# Patient Record
Sex: Female | Born: 1940 | Race: White | Hispanic: No | Marital: Married | Smoking: Never smoker
Health system: Southern US, Community
[De-identification: ages and names within clinical notes are randomized; demographics above are authoritative.]

## PROBLEM LIST (undated history)

## (undated) DIAGNOSIS — G4730 Sleep apnea, unspecified: Secondary | ICD-10-CM

## (undated) DIAGNOSIS — C801 Malignant (primary) neoplasm, unspecified: Secondary | ICD-10-CM

## (undated) DIAGNOSIS — I4891 Unspecified atrial fibrillation: Secondary | ICD-10-CM

## (undated) DIAGNOSIS — Z9289 Personal history of other medical treatment: Secondary | ICD-10-CM

## (undated) DIAGNOSIS — N3281 Overactive bladder: Secondary | ICD-10-CM

## (undated) DIAGNOSIS — Z9889 Other specified postprocedural states: Secondary | ICD-10-CM

## (undated) DIAGNOSIS — E039 Hypothyroidism, unspecified: Secondary | ICD-10-CM

## (undated) DIAGNOSIS — F32A Depression, unspecified: Secondary | ICD-10-CM

## (undated) DIAGNOSIS — I499 Cardiac arrhythmia, unspecified: Secondary | ICD-10-CM

## (undated) DIAGNOSIS — K624 Stenosis of anus and rectum: Secondary | ICD-10-CM

## (undated) DIAGNOSIS — F329 Major depressive disorder, single episode, unspecified: Secondary | ICD-10-CM

## (undated) DIAGNOSIS — I10 Essential (primary) hypertension: Secondary | ICD-10-CM

## (undated) DIAGNOSIS — M1990 Unspecified osteoarthritis, unspecified site: Secondary | ICD-10-CM

## (undated) DIAGNOSIS — E785 Hyperlipidemia, unspecified: Secondary | ICD-10-CM

## (undated) HISTORY — PX: JOINT REPLACEMENT: SHX530

## (undated) HISTORY — DX: Unspecified atrial fibrillation: I48.91

## (undated) HISTORY — PX: ABDOMINAL HYSTERECTOMY: SHX81

## (undated) HISTORY — DX: Major depressive disorder, single episode, unspecified: F32.9

## (undated) HISTORY — DX: Depression, unspecified: F32.A

## (undated) HISTORY — PX: BREAST SURGERY: SHX581

## (undated) HISTORY — PX: ANAL DILATION: SHX1137

## (undated) HISTORY — DX: Overactive bladder: N32.81

---

## 1984-07-30 HISTORY — PX: COLON SURGERY: SHX602

## 1998-02-07 ENCOUNTER — Other Ambulatory Visit: Admission: RE | Admit: 1998-02-07 | Discharge: 1998-02-07 | Payer: Self-pay | Admitting: *Deleted

## 1998-02-16 ENCOUNTER — Encounter: Admission: RE | Admit: 1998-02-16 | Discharge: 1998-05-17 | Payer: Self-pay | Admitting: Radiation Oncology

## 1998-02-17 ENCOUNTER — Ambulatory Visit (HOSPITAL_COMMUNITY): Admission: RE | Admit: 1998-02-17 | Discharge: 1998-02-17 | Payer: Self-pay | Admitting: *Deleted

## 1998-08-24 ENCOUNTER — Other Ambulatory Visit: Admission: RE | Admit: 1998-08-24 | Discharge: 1998-08-24 | Payer: Self-pay | Admitting: Radiology

## 2003-06-01 ENCOUNTER — Ambulatory Visit (HOSPITAL_COMMUNITY): Admission: RE | Admit: 2003-06-01 | Discharge: 2003-06-01 | Payer: Self-pay | Admitting: General Surgery

## 2003-06-30 ENCOUNTER — Ambulatory Visit (HOSPITAL_COMMUNITY): Admission: RE | Admit: 2003-06-30 | Discharge: 2003-06-30 | Payer: Self-pay | Admitting: Interventional Cardiology

## 2007-05-07 ENCOUNTER — Encounter (INDEPENDENT_AMBULATORY_CARE_PROVIDER_SITE_OTHER): Payer: Self-pay | Admitting: General Surgery

## 2007-05-07 ENCOUNTER — Ambulatory Visit (HOSPITAL_COMMUNITY): Admission: RE | Admit: 2007-05-07 | Discharge: 2007-05-07 | Payer: Self-pay | Admitting: General Surgery

## 2008-05-27 ENCOUNTER — Encounter: Admission: RE | Admit: 2008-05-27 | Discharge: 2008-05-27 | Payer: Self-pay | Admitting: Orthopedic Surgery

## 2009-10-14 ENCOUNTER — Inpatient Hospital Stay (HOSPITAL_COMMUNITY): Admission: RE | Admit: 2009-10-14 | Discharge: 2009-10-17 | Payer: Self-pay | Admitting: Orthopedic Surgery

## 2010-05-08 ENCOUNTER — Ambulatory Visit: Payer: Self-pay | Admitting: Oncology

## 2010-05-10 ENCOUNTER — Encounter: Admission: RE | Admit: 2010-05-10 | Discharge: 2010-05-10 | Payer: Self-pay | Admitting: Radiology

## 2010-06-12 ENCOUNTER — Encounter (INDEPENDENT_AMBULATORY_CARE_PROVIDER_SITE_OTHER): Payer: Self-pay | Admitting: Plastic Surgery

## 2010-06-12 ENCOUNTER — Inpatient Hospital Stay (HOSPITAL_COMMUNITY): Admission: RE | Admit: 2010-06-12 | Discharge: 2010-06-14 | Payer: Self-pay | Admitting: General Surgery

## 2010-06-27 ENCOUNTER — Ambulatory Visit: Payer: Self-pay | Admitting: Oncology

## 2010-07-06 ENCOUNTER — Encounter
Admission: RE | Admit: 2010-07-06 | Discharge: 2010-07-19 | Payer: Self-pay | Source: Home / Self Care | Attending: Oncology | Admitting: Oncology

## 2010-08-30 ENCOUNTER — Encounter (HOSPITAL_BASED_OUTPATIENT_CLINIC_OR_DEPARTMENT_OTHER)
Admission: RE | Admit: 2010-08-30 | Discharge: 2010-08-30 | Disposition: A | Discharge status: Home / Self Care | Payer: Medicare Other | Source: Ambulatory Visit | Attending: Plastic Surgery | Admitting: Plastic Surgery

## 2010-08-30 DIAGNOSIS — Z01812 Encounter for preprocedural laboratory examination: Secondary | ICD-10-CM | POA: Insufficient documentation

## 2010-08-30 LAB — POCT I-STAT, CHEM 8
BUN: 17 mg/dL (ref 6–23)
Calcium, Ion: 1.18 mmol/L (ref 1.12–1.32)
Chloride: 105 mEq/L (ref 96–112)
Creatinine, Ser: 0.7 mg/dL (ref 0.4–1.2)
Glucose, Bld: 100 mg/dL — ABNORMAL HIGH (ref 70–99)
HCT: 38 % (ref 36.0–46.0)
Hemoglobin: 12.9 g/dL (ref 12.0–15.0)
Potassium: 4.4 mEq/L (ref 3.5–5.1)
Sodium: 141 mEq/L (ref 135–145)
TCO2: 29 mmol/L (ref 0–100)

## 2010-08-31 ENCOUNTER — Other Ambulatory Visit: Payer: Self-pay | Admitting: Plastic Surgery

## 2010-08-31 ENCOUNTER — Ambulatory Visit (HOSPITAL_BASED_OUTPATIENT_CLINIC_OR_DEPARTMENT_OTHER): Admission: RE | Admit: 2010-08-31 | Payer: Medicare Other | Source: Ambulatory Visit | Admitting: Plastic Surgery

## 2010-08-31 ENCOUNTER — Ambulatory Visit (HOSPITAL_BASED_OUTPATIENT_CLINIC_OR_DEPARTMENT_OTHER)
Admission: RE | Admit: 2010-08-31 | Discharge: 2010-08-31 | Disposition: A | Discharge status: Home / Self Care | Payer: Medicare Other | Source: Ambulatory Visit | Attending: Plastic Surgery | Admitting: Plastic Surgery

## 2010-08-31 DIAGNOSIS — T85698A Other mechanical complication of other specified internal prosthetic devices, implants and grafts, initial encounter: Secondary | ICD-10-CM | POA: Insufficient documentation

## 2010-08-31 DIAGNOSIS — Z01812 Encounter for preprocedural laboratory examination: Secondary | ICD-10-CM | POA: Insufficient documentation

## 2010-08-31 DIAGNOSIS — Z9010 Acquired absence of unspecified breast and nipple: Secondary | ICD-10-CM | POA: Insufficient documentation

## 2010-08-31 DIAGNOSIS — C50919 Malignant neoplasm of unspecified site of unspecified female breast: Secondary | ICD-10-CM | POA: Insufficient documentation

## 2010-08-31 DIAGNOSIS — Y834 Other reconstructive surgery as the cause of abnormal reaction of the patient, or of later complication, without mention of misadventure at the time of the procedure: Secondary | ICD-10-CM | POA: Insufficient documentation

## 2010-08-31 DIAGNOSIS — G4733 Obstructive sleep apnea (adult) (pediatric): Secondary | ICD-10-CM | POA: Insufficient documentation

## 2010-08-31 DIAGNOSIS — E669 Obesity, unspecified: Secondary | ICD-10-CM | POA: Insufficient documentation

## 2010-08-31 DIAGNOSIS — I10 Essential (primary) hypertension: Secondary | ICD-10-CM | POA: Insufficient documentation

## 2010-09-12 NOTE — Discharge Summary (Signed)
[name], [name]             ACCOUNT NO.:  [id number]  MEDICAL RECORD NO.:  [id number]           PATIENT TYPE:  LOCATION:                                 FACILITY:  PHYSICIAN:  Dyke Brackett, M.D.    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  PROCEDURE PERFORMED:  Right total knee arthroplasty.  HISTORY OF ILLNESS:  The patient is a 70 year old woman, who has a many year history of right knee pain that has failed conservative treatment, including anti-inflammatories, rest and physical therapy.  Pain is preventing activities of daily living, easy sleep and safe ambulation and after discussing risks versus benefits, she wishes to proceed with a right total knee arthroplasty.  PHYSICAL EXAMINATION:  VITAL SIGNS:  Preoperative history and physical showed her vital signs to be stable.  Weight 190, height 5 feet 7. HEENT/NECK:  Atraumatic.  Pupils equal, round and reactive to light and accommodation.  TMs are clear. Neck is patent.  Throat clear.  Full range of motion of the neck, no bruits. CHEST/LUNGS:  Clear to auscultation. CARDIAC:  Regular rate and rhythm without murmur. ABDOMEN:  Soft, nontender.  No masses.  Bowel sounds 2+. NEUROVASCULARLY:  She is intact to all extremities. SKIN:  Within normal limits. MUSCULOSKELETAL:  Shows that her right lower extremity is neurovascularly intact with no obvious ligamentous laxity, slight valgus deformity, pain with range of motion.  PAST MEDICAL HISTORY: 1. Hypertension. 2. Dyslipidemia. 3. History of constipation.  ADMISSION MEDICATIONS: 1. Lipitor 80 mg. 2. Lotrel 05/20 mg daily. 3. Paroxetine 40 mg daily. 4. Hydrochlorothiazide 25 mg daily. 5. Tramadol/acetaminophen 37.5/325 daily. 6. Vesicare 10 mg at bedtime. 7. Enteric-coated aspirin  81 mg daily. 8. Tums as needed. 9. Centrum Silver daily. 10.Stool softener at bedtime.  LABORATORY DATA:  Preoperative labs including CBC, CMET, chest x-ray, EKG, PT and PTT were all within acceptable limits.  On the day of the patient's admission, she was taken to the operating room at [facility], where she underwent a right total knee replacement using an [employer] knee, size 4 tibia, size 4 femur, 10-mm bearing and a 35 mm patella.  The patient was placed on perioperative antibiotics.  She was placed on postoperative DVT prophylaxis utilizing Lovenox, as well as mechanical foot pumps and TED hose.  She was placed on pain control with both oral and p.r.n. injectable medication.  Physical therapy was begun the day of surgery with CPM, as well as physical therapy and OT.  HOSPITAL COURSE:  On postoperative day #1, the patient was complaining of mild pain.  She was alert and oriented x3.  The wound was clean and dry.  Calf was soft, nontender.  She was neurovascularly intact to light touch and motor.  Lungs were clear.  She was otherwise medically stable. She did have a decreased hemoglobin of 9.8, but showed no symptoms of dizziness or weakness.  The patient was continued to be monitored for that problem.  She continued with physical therapy utilizing CPM, as well as formal physical therapy.  Postoperative day #2, the patient continued to make slow, but steady progress in physical therapy.  The pain remained at a minimum of 1-2/10.  She was afebrile.  Vital signs were stable.  Hemoglobin 9.8 and WBC 9.8 as well.  Wound was clean and dry.  No sign of infection.  Good range of motion about the knee, medically stable.  Lungs clear.  She continued to require significant assist in physical therapy and was held another day for continued treatment.  Postoperative day #3, the patient was doing well.  She had a positive bowel movement and vital signs were stable.  Her  hemoglobin remained steady at 9.9 with a hematocrit of 28.4, WBC of 10.2.  She was making steady progress in physical therapy and was felt to be transferring with only minimal assistance and safe to go home to the care of her family and was thus discharged to the care of her family on the third postoperative day.  She will be going home with [facility] providing home health PT, CPM walker and 3-in-1 commode seat. She will return to see Dr. Madelon Lips at the 2-week postoperative mark and should call [phone] for appointment.  She is to return sooner should she have any difficulties with increased temperature greater than 101, drainage from the wound or pain not well-controlled by pain medication.  DISCHARGE MEDICATIONS: 1. Percocet 5/325 one to two tablets by mouth q.4-6 hours as needed     for pain. 2. Robaxin 500 mg 1 tablet by mouth every 6-8 h., as needed for spasm. 3. Lovenox 40 mg subcutaneous injection every day at 8 a.m. daily. 4. Lipitor 80 mg 1 p.o. daily. 5. Lotrel 5/20 daily. 6. Paroxetine 40 mg daily. 7. Hydrochlorothiazide 25 mg p.o. daily. 8. Vesicare 10 mg 1 p.o. daily. 9. Tums 1000 mg daily as needed. 10.Centrum Silver daily. 11.Stool softener at bedtime.  DRESSING CHANGES:  The patient will change the dressing on a daily basis or should the dressing become soiled or wet.  ACTIVITIES:  Weightbearing as tolerated with walker, CPM 6-8 hours a day set from 0-90.  FINAL DIAGNOSIS:  End-stage arthritis right knee.  PROCEDURE PERFORMED:  Right total knee arthroplasty.     [name]. [name]   ______________________________ Dyke Brackett, M.D.    JBR/MEDQ  D:  08/09/2010  T:  08/09/2010  Job:  [id number]  Electronically Signed by Dan Humphreys P.A. on 08/29/2010 11:00:21 AM Electronically Signed by Lacretia Nicks. Saed Hudlow M.D. on 09/12/2010 09:35:31 AM

## 2010-09-18 NOTE — Op Note (Signed)
  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  PROCEDURE:  Irrigation debridement of right breast skin and primary closure.  ANESTHESIA:  General.  INDICATIONS FOR PROCEDURE:  The patient is a 70 year old female who was diagnosed with breast cancer and underwent a mastectomy with immediate reconstruction using an expander and AlloDerm.  The AlloDerm was exposed and there was some concern leaving it for any length of time. Therefore, we may need a decision to bring it to the OR.  Risks and complications were explained.  Consent was confirmed.  DESCRIPTION OF PROCEDURE:  The patient was seen in the preop holding and taken to the OR.  She was placed on the operating room table in supine position.  General anesthesia was administered.  Once adequate time-out was called, she was prepped and draped in the usual sterile fashion. All information was confirmed to be correct and 15 blade was used to make an elliptical incision around the open area and including the soft tissue and skin that was exposed.  Hemostasis was achieved using electrocautery.  There was very good incorporation of the AlloDerm medially and superiorly.  There was only a small portion inferior lateral that was not yet incorporated.  There was no sign of infection. No pus.  It was irrigated with antibiotic solution.  The deep layer was closed with a 4-0 Vicryl interrupted and running 4-0 Vicryl and a running 5-0 Monocryl to close the skin.  Dermabond was applied prior to closure.  A 7-French drain  was placed in the area to help prevent any fluid-filled up that would inhibit incorporation of the AlloDerm that was tacked in place with a 3-0 silk.  She tolerated the procedure well. No complications.  She was awoken and taken to recovery room in stable condition.  The skin was sent to Pathology, since it was a part of the breast skin.     Wayland Denis, DO     CS/MEDQ  D:  08/31/2010  T:  09/01/2010  Job:  [id number]  Electronically Signed by Wayland Denis  on 09/18/2010 10:17:12 AM

## 2010-10-10 LAB — URINALYSIS, ROUTINE W REFLEX MICROSCOPIC
Bilirubin Urine: NEGATIVE
Glucose, UA: NEGATIVE mg/dL
Hgb urine dipstick: NEGATIVE
Ketones, ur: NEGATIVE mg/dL
Nitrite: NEGATIVE
Protein, ur: NEGATIVE mg/dL
Specific Gravity, Urine: 1.018 (ref 1.005–1.030)
Urobilinogen, UA: 0.2 mg/dL (ref 0.0–1.0)
pH: 5.5 (ref 5.0–8.0)

## 2010-10-10 LAB — BASIC METABOLIC PANEL
BUN: 10 mg/dL (ref 6–23)
BUN: 8 mg/dL (ref 6–23)
CO2: 26 mEq/L (ref 19–32)
CO2: 29 mEq/L (ref 19–32)
Calcium: 7.9 mg/dL — ABNORMAL LOW (ref 8.4–10.5)
Calcium: 8.2 mg/dL — ABNORMAL LOW (ref 8.4–10.5)
Chloride: 100 mEq/L (ref 96–112)
Chloride: 103 mEq/L (ref 96–112)
Creatinine, Ser: 0.76 mg/dL (ref 0.4–1.2)
Creatinine, Ser: 0.8 mg/dL (ref 0.4–1.2)
GFR calc Af Amer: >60 mL/min (ref >60)
GFR calc Af Amer: >60 mL/min (ref >60)
GFR calc non Af Amer: >60 mL/min (ref >60)
GFR calc non Af Amer: >60 mL/min (ref >60)
Glucose, Bld: 139 mg/dL — ABNORMAL HIGH (ref 70–99)
Glucose, Bld: 156 mg/dL — ABNORMAL HIGH (ref 70–99)
Potassium: 3.6 mEq/L (ref 3.5–5.1)
Potassium: 3.7 mEq/L (ref 3.5–5.1)
Sodium: 135 mEq/L (ref 135–145)
Sodium: 137 mEq/L (ref 135–145)

## 2010-10-10 LAB — CBC
HCT: 32.1 % — ABNORMAL LOW (ref 36.0–46.0)
HCT: 36.1 % (ref 36.0–46.0)
HCT: 40.9 % (ref 36.0–46.0)
Hemoglobin: 10.7 g/dL — ABNORMAL LOW (ref 12.0–15.0)
Hemoglobin: 11.7 g/dL — ABNORMAL LOW (ref 12.0–15.0)
Hemoglobin: 13.9 g/dL (ref 12.0–15.0)
MCH: 28.9 pg (ref 26.0–34.0)
MCH: 29.9 pg (ref 26.0–34.0)
MCH: 30.2 pg (ref 26.0–34.0)
MCHC: 32.4 g/dL (ref 30.0–36.0)
MCHC: 33.3 g/dL (ref 30.0–36.0)
MCHC: 34 g/dL (ref 30.0–36.0)
MCV: 88.7 fL (ref 78.0–100.0)
MCV: 89.1 fL (ref 78.0–100.0)
MCV: 89.7 fL (ref 78.0–100.0)
Platelets: 226 10*3/uL (ref 150–400)
Platelets: 233 10*3/uL (ref 150–400)
Platelets: 287 10*3/uL (ref 150–400)
RBC: 3.58 MIL/uL — ABNORMAL LOW (ref 3.87–5.11)
RBC: 4.05 MIL/uL (ref 3.87–5.11)
RBC: 4.61 MIL/uL (ref 3.87–5.11)
RDW: 14 % (ref 11.5–15.5)
RDW: 14.1 % (ref 11.5–15.5)
RDW: 14.3 % (ref 11.5–15.5)
WBC: 13.8 10*3/uL — ABNORMAL HIGH (ref 4.0–10.5)
WBC: 8.8 10*3/uL (ref 4.0–10.5)
WBC: 9.7 10*3/uL (ref 4.0–10.5)

## 2010-10-10 LAB — DIFFERENTIAL
Basophils Absolute: 0.1 10*3/uL (ref 0.0–0.1)
Basophils Relative: 1 % (ref 0–1)
Eosinophils Absolute: 0.3 10*3/uL (ref 0.0–0.7)
Eosinophils Relative: 3 % (ref 0–5)
Lymphocytes Relative: 33 % (ref 12–46)
Lymphs Abs: 3.2 10*3/uL (ref 0.7–4.0)
Monocytes Absolute: 1 10*3/uL (ref 0.1–1.0)
Monocytes Relative: 11 % (ref 3–12)
Neutro Abs: 5.1 10*3/uL (ref 1.7–7.7)
Neutrophils Relative %: 52 % (ref 43–77)

## 2010-10-10 LAB — SURGICAL PCR SCREEN
MRSA, PCR: NEGATIVE
Staphylococcus aureus: NEGATIVE

## 2010-10-10 LAB — URINE MICROSCOPIC-ADD ON

## 2010-10-10 LAB — COMPREHENSIVE METABOLIC PANEL
ALT: 24 U/L (ref 0–35)
AST: 30 U/L (ref 0–37)
Albumin: 4.1 g/dL (ref 3.5–5.2)
Alkaline Phosphatase: 96 U/L (ref 39–117)
BUN: 12 mg/dL (ref 6–23)
CO2: 30 mEq/L (ref 19–32)
Calcium: 9.4 mg/dL (ref 8.4–10.5)
Chloride: 101 mEq/L (ref 96–112)
Creatinine, Ser: 0.71 mg/dL (ref 0.4–1.2)
GFR calc Af Amer: >60 mL/min (ref >60)
GFR calc non Af Amer: >60 mL/min (ref >60)
Glucose, Bld: 93 mg/dL (ref 70–99)
Potassium: 3.6 mEq/L (ref 3.5–5.1)
Sodium: 138 mEq/L (ref 135–145)
Total Bilirubin: 0.4 mg/dL (ref 0.3–1.2)
Total Protein: 6.7 g/dL (ref 6.0–8.3)

## 2010-10-23 LAB — BASIC METABOLIC PANEL
BUN: 5 mg/dL — ABNORMAL LOW (ref 6–23)
BUN: 6 mg/dL (ref 6–23)
CO2: 31 mEq/L (ref 19–32)
Calcium: 7.9 mg/dL — ABNORMAL LOW (ref 8.4–10.5)
Chloride: 97 mEq/L (ref 96–112)
Chloride: 97 mEq/L (ref 96–112)
Creatinine, Ser: 0.58 mg/dL (ref 0.4–1.2)
GFR calc Af Amer: >60 mL/min (ref >60)
GFR calc non Af Amer: >60 mL/min (ref >60)
GFR calc non Af Amer: >60 mL/min (ref >60)
Glucose, Bld: 115 mg/dL — ABNORMAL HIGH (ref 70–99)
Potassium: 2.7 mEq/L — CL (ref 3.5–5.1)
Potassium: 3.1 mEq/L — ABNORMAL LOW (ref 3.5–5.1)
Potassium: 3.2 mEq/L — ABNORMAL LOW (ref 3.5–5.1)
Sodium: 138 mEq/L (ref 135–145)

## 2010-10-23 LAB — COMPREHENSIVE METABOLIC PANEL
ALT: 31 U/L (ref 0–35)
AST: 31 U/L (ref 0–37)
Albumin: 4.1 g/dL (ref 3.5–5.2)
Alkaline Phosphatase: 93 U/L (ref 39–117)
BUN: 12 mg/dL (ref 6–23)
CO2: 30 mEq/L (ref 19–32)
Calcium: 9.4 mg/dL (ref 8.4–10.5)
Chloride: 102 mEq/L (ref 96–112)
Creatinine, Ser: 0.67 mg/dL (ref 0.4–1.2)
GFR calc Af Amer: >60 mL/min (ref >60)
GFR calc non Af Amer: >60 mL/min (ref >60)
Glucose, Bld: 104 mg/dL — ABNORMAL HIGH (ref 70–99)
Potassium: 4.2 mEq/L (ref 3.5–5.1)
Sodium: 139 mEq/L (ref 135–145)
Total Bilirubin: 0.5 mg/dL (ref 0.3–1.2)
Total Protein: 6.7 g/dL (ref 6.0–8.3)

## 2010-10-23 LAB — CBC
HCT: 28.1 % — ABNORMAL LOW (ref 36.0–46.0)
HCT: 28.4 % — ABNORMAL LOW (ref 36.0–46.0)
HCT: 28.5 % — ABNORMAL LOW (ref 36.0–46.0)
HCT: 40.2 % (ref 36.0–46.0)
Hemoglobin: 13.8 g/dL (ref 12.0–15.0)
Hemoglobin: 9.8 g/dL — ABNORMAL LOW (ref 12.0–15.0)
MCHC: 34.2 g/dL (ref 30.0–36.0)
MCHC: 34.9 g/dL (ref 30.0–36.0)
MCV: 91.3 fL (ref 78.0–100.0)
MCV: 91.4 fL (ref 78.0–100.0)
MCV: 91.8 fL (ref 78.0–100.0)
Platelets: 220 10*3/uL (ref 150–400)
Platelets: 225 10*3/uL (ref 150–400)
Platelets: 291 10*3/uL (ref 150–400)
RBC: 3.09 MIL/uL — ABNORMAL LOW (ref 3.87–5.11)
RBC: 4.4 MIL/uL (ref 3.87–5.11)
RDW: 12.8 % (ref 11.5–15.5)
RDW: 13 % (ref 11.5–15.5)
WBC: 10.2 10*3/uL (ref 4.0–10.5)
WBC: 6.5 10*3/uL (ref 4.0–10.5)
WBC: 8.6 10*3/uL (ref 4.0–10.5)
WBC: 9.6 10*3/uL (ref 4.0–10.5)

## 2010-10-23 LAB — URINE CULTURE

## 2010-10-23 LAB — DIFFERENTIAL
Basophils Absolute: 0 10*3/uL (ref 0.0–0.1)
Basophils Relative: 1 % (ref 0–1)
Eosinophils Absolute: 0.1 10*3/uL (ref 0.0–0.7)
Eosinophils Relative: 1 % (ref 0–5)
Lymphocytes Relative: 30 % (ref 12–46)
Lymphs Abs: 1.9 10*3/uL (ref 0.7–4.0)
Monocytes Absolute: 0.6 10*3/uL (ref 0.1–1.0)
Monocytes Relative: 9 % (ref 3–12)
Neutro Abs: 3.9 10*3/uL (ref 1.7–7.7)
Neutrophils Relative %: 59 % (ref 43–77)

## 2010-10-23 LAB — URINALYSIS, ROUTINE W REFLEX MICROSCOPIC
Bilirubin Urine: NEGATIVE
Glucose, UA: NEGATIVE mg/dL
Hgb urine dipstick: NEGATIVE
Ketones, ur: NEGATIVE mg/dL
Nitrite: NEGATIVE
Protein, ur: NEGATIVE mg/dL
Specific Gravity, Urine: 1.007 (ref 1.005–1.030)
Urobilinogen, UA: 0.2 mg/dL (ref 0.0–1.0)
pH: 7 (ref 5.0–8.0)

## 2010-10-23 LAB — URINE MICROSCOPIC-ADD ON

## 2010-10-23 LAB — ABO/RH: ABO/RH(D): O POS

## 2010-10-23 LAB — PROTIME-INR
INR: 0.94 (ref 0.00–1.49)
Prothrombin Time: 12.5 seconds (ref 11.6–15.2)

## 2010-10-23 LAB — APTT: aPTT: 28 seconds (ref 24–37)

## 2010-10-23 LAB — TYPE AND SCREEN: Antibody Screen: NEGATIVE

## 2010-12-12 NOTE — Op Note (Signed)
[name], [name]             ACCOUNT NO.:  [id number]   MEDICAL RECORD NO.:  [id number]          PATIENT TYPE:  AMB   LOCATION:  DAY                          FACILITY:  [facility]   PHYSICIAN:  Timothy E. Earlene Plater, M.D. DATE OF BIRTH:  1940-07-31   DATE OF PROCEDURE:  DATE OF DISCHARGE:                               OPERATIVE REPORT   PREOPERATIVE DIAGNOSES:  1. Anal stenosis.  2. Anal polyps.   PROCEDURE:  1,  Exam under anesthesia.  1. Anal stretch.  2. Excision of polyp.   SURGEON:  Timothy E. Earlene Plater, M.D.   ANESTHESIA:  General.   [name] is 56, well, with controlled hypertension, who has had lower  bowel problems for some years with difficulty voiding.  She had been  treated in the past for fissure and anal stenosis.  Her condition has  worsened again as long-term follow-up in the office has demonstrated and  because of the presence of polyps and stenosis, this surgery is advised  and she agrees.  She is seen, identified and the permit is signed this  morning.   The patient is taken to the operating room and placed supine, LMA  anesthesia provided.  She was placed in lithotomy position.  Perianal  area inspected, prepped and draped in the usual fashion.  Gentle  dilatation of the anus was accomplished with the lubricated finger.  Marcaine 0.25% with epinephrine was injected around and about the anal  orifice and massaged in well.  An anal polyp was present posteriorly on  the anoderm and a larger rectal polyp was present in the right lateral  position at the junction of the papillae.  Both of these were excised.  The base was sutured with a 3-0 chromic.  Further inspection revealed no  other pathology.  The anus remained very snug on the examining finger,  so I did a partial sphincterotomy in the left posterior position,  internal sphincter only.  This completed the procedure.  There was no  other pathology.  Gelfoam and a dry sterile dressing were applied.  Counts  correct.  She tolerated it well.   Written and verbal instructions given, including Vicodin for pain, and  she will be seen and followed as an outpatient.      Timothy E. Earlene Plater, M.D.  Electronically Signed     TED/MEDQ  D:  05/07/2007  T:  05/07/2007  Job:  [id number]   cc:   Hal T. Stoneking, M.D.  Fax: [phone]

## 2010-12-15 NOTE — Cardiovascular Report (Signed)
NAME:  [name], [name]                       ACCOUNT NO.:  [id number]   MEDICAL RECORD NO.:  [id number]                   PATIENT TYPE:  OIB   LOCATION:  2894                                 FACILITY:  [facility]   PHYSICIAN:  Lesleigh Noe, M.D.            DATE OF BIRTH:  [birth year]   DATE OF PROCEDURE:  06/30/2003  DATE OF DISCHARGE:  06/30/2003                              CARDIAC CATHETERIZATION   INDICATIONS FOR PROCEDURE:  Abnormal Cardiolite stress test.   DATE OF PROCEDURE:  June 30, 2003.   PROCEDURE PERFORMED:  1. Left heart catheterization.  2. Selective coronary angiography.  3. Left ventriculography.  4. AngioSeal arteriotomy closure.   DESCRIPTION:  After informed consent, a 6-French sheath was placed in the  right femoral artery using modified Seldinger technique.  A 6-French A2  multipurpose catheter was used for hemodynamic recordings, left  ventriculography by hand injection and flush opacification of the right and  left coronaries.   Because we were unable to selectively engage either coronary artery with  multipurpose, we changed to #4 6-French left Judkins with a #4 6-French  right Judkins catheter.  Left ventriculography was performed with a 6-French  A2 multipurpose catheter by hand injection.   After completion of the diagnostic procedure, sheathogram was performed on  the right femoral to demonstrate an adequate entry site to allow  percutaneous arteriotomy closure.  We used Angio-Seal without complication  and with good hemostasis.  The patient was taken to the holding area in  stable condition.   RESULTS:   I. HEMODYNAMIC DATA:  A.  Aortic pressure 145/72.  B.  Left ventricular pressure 145/14.   II. LEFT VENTRICULOGRAPHY:  Total cavity size and systolic function are  normal.  EF 70%.   III. CORONARY ANGIOGRAPHY:  A.  Left main coronary:  Widely patent.  B.  Left anterior descending coronary:  The LAD is a large vessel that gives  origin  to a large branching diagonal.  Minimal luminal irregularities noted.  No significant obstructive disease is noted.  LAD reaches the left  ventricular apex.  C.  Circumflex artery:  The circumflex artery gives origin to a large branch  first obtuse marginal, also a second obtuse marginal branch.  The vessel  contains minimal luminal irregularities, but is essentially normal  otherwise.  D.  Right coronary:  The right coronary artery is large and free of any  significant obstruction.  PDA and left ventricular branch are normal.   IV. PERCUTANEOUS ARTERIOTOMY CLOSURE USING ANGIO-SEAL:  Successful.   CONCLUSIONS:  1. Essentially normal coronaries.  2. Normal left ventricular function.  3. Successful AngioSeal arteriotomy closure.                                               Lyn Records  III, M.D.    HWS/MEDQ  D:  06/30/2003  T:  06/30/2003  Job:  [id number]   cc:   Hal T. Stoneking, M.D.  [address] [place], [place] [address]  Fax: [phone]

## 2010-12-15 NOTE — Op Note (Signed)
   NAME:  [name], [name]                       ACCOUNT NO.:  [id number]   MEDICAL RECORD NO.:  [id number]                   PATIENT TYPE:  AMB   LOCATION:  DAY                                  FACILITY:  [facility]   PHYSICIAN:  Timothy E. Earlene Plater, M.D.              DATE OF BIRTH:  22-Oct-1940   DATE OF PROCEDURE:  06/01/2003  DATE OF DISCHARGE:                                 OPERATIVE REPORT   PREOPERATIVE DIAGNOSIS:  Anal fissure.   POSTOPERATIVE DIAGNOSES:  1. Anal fissure.  2. Anal stenosis.  3. Hemorrhoids.   PROCEDURE:  Examination under anesthesia, repair of anal fissure.   SURGEON:  Timothy E. Earlene Plater, M.D.   ANESTHESIA:  General anesthesia.   INDICATIONS FOR PROCEDURE:  [name] has been seen and followed in the  office for chronic anal fissure.  She has failed conservative management,  corrected her bowel habits and she wishes to proceed with surgical repair.  This has been carefully and completely discussed with her.  She is  identified and the permit signed.  Laboratory data reviewed.   DESCRIPTION OF PROCEDURE:  She is taken to the operating room, placed  supine.  LMA anesthesia provided.  She was gently placed in the lithotomy  position.  The perianal area was inspected, prepped and draped in the usual  fashion.  Marcaine 0.5% with epinephrine mixed 9:1 with Wydase was injected  around and about the anal orifice and massaged in well.  Digital and  anoscopic examination was performed revealing internal hemorrhoids with some  external varicosities, anal stenosis of a moderate degree and a right  posterior anal fissure with sessile tag.  A left posterior internal  sphincterotomy carefully accomplished percutaneously with a 15 blade.  This  allowed for gentle dilation.  Anoscope was introduced.  The fissure was  identified and cauterized.  The external tag attached there was excised,  undermined, closed with a subcuticular 4-0 catgut suture.  All areas were  inspected  and no complications.  The external sphincter was fully intact.  Gelfoam gauze and dry sterile dressing applied.  She tolerated it well and  was removed to the recovery room in good condition.   Written and verbal instructions including Percocet 5 mg as needed for pain  were given.  She will be seen and followed in the office.                                               Timothy E. Earlene Plater, M.D.    TED/MEDQ  D:  06/01/2003  T:  06/01/2003  Job:  [id number]   cc:   Hal T. Stoneking, M.D.  [address] [place], [place] [address]  Fax: [phone]

## 2010-12-15 NOTE — Cardiovascular Report (Signed)
NAME:  [name], [name]                       ACCOUNT NO.:  [id number]   MEDICAL RECORD NO.:  [id number]                   PATIENT TYPE:  OIB   LOCATION:  2894                                 FACILITY:  [facility]   PHYSICIAN:  Lesleigh Noe, M.D.            DATE OF BIRTH:  01-17-1941   DATE OF PROCEDURE:  06/30/2003  DATE OF DISCHARGE:  06/30/2003                              CARDIAC CATHETERIZATION   INDICATIONS:  Abnormal Cardiolite.   PROCEDURE PERFORMED:  1. Left heart catheterization.  2. Selective coronary angiography.  3. Left ventriculography.   DESCRIPTION:  After informed consent, a 6-French sheath in the right femoral  artery using a modified Seldinger technique. A 6-French A2 multipurpose  catheter was used for hemodynamic recordings, left ventriculography, and  coronary angiography. We used Angioseal for arteriotomy closure. The patient  tolerated the procedure without complications.   RESULTS:  A. Hemodynamic data.     A. Aortic pressure 140/69     B. Left ventricular pressure 142/14.  B. Left ventriculography. Left ventricular cavity size is normal. LV     function is normal.  C. Coronary angiography.     A. Left main coronary:  Normal.     B. Left anterior descending coronary:  Normal.     C. Circumflex coronary artery:  Normal.     D. Right coronary artery:  Normal.  D. Angioseal arteriotomy closure without complications.   CONCLUSION:  1. Normal coronary arteries.  2. Normal left ventricular function.   PLAN:  Home, leaving today.                                               Lesleigh Noe, M.D.   HWS/MEDQ  D:  09/21/2003  T:  09/22/2003  Job:  [id number]   cc:   Hal T. Stoneking, M.D.  [address] [place], [place] [address]  Fax: [phone]

## 2011-04-10 ENCOUNTER — Ambulatory Visit
Admission: RE | Admit: 2011-04-10 | Discharge: 2011-04-10 | Disposition: A | Discharge status: Home / Self Care | Payer: Medicare Other | Source: Ambulatory Visit | Attending: Plastic Surgery | Admitting: Plastic Surgery

## 2011-04-10 ENCOUNTER — Encounter (HOSPITAL_BASED_OUTPATIENT_CLINIC_OR_DEPARTMENT_OTHER)
Admission: RE | Admit: 2011-04-10 | Discharge: 2011-04-10 | Disposition: A | Discharge status: Home / Self Care | Payer: Medicare Other | Source: Ambulatory Visit | Attending: Plastic Surgery | Admitting: Plastic Surgery

## 2011-04-10 ENCOUNTER — Other Ambulatory Visit: Payer: Self-pay | Admitting: Plastic Surgery

## 2011-04-10 DIAGNOSIS — Z01811 Encounter for preprocedural respiratory examination: Secondary | ICD-10-CM

## 2011-04-10 LAB — BASIC METABOLIC PANEL
CO2: 32 mEq/L (ref 19–32)
Calcium: 9.6 mg/dL (ref 8.4–10.5)
Creatinine, Ser: 0.69 mg/dL (ref 0.50–1.10)
GFR calc non Af Amer: >60 mL/min (ref >60)
Glucose, Bld: 86 mg/dL (ref 70–99)
Sodium: 141 mEq/L (ref 135–145)

## 2011-04-12 ENCOUNTER — Other Ambulatory Visit: Payer: Self-pay | Admitting: Plastic Surgery

## 2011-04-12 ENCOUNTER — Ambulatory Visit (HOSPITAL_BASED_OUTPATIENT_CLINIC_OR_DEPARTMENT_OTHER)
Admission: RE | Admit: 2011-04-12 | Discharge: 2011-04-12 | Disposition: A | Discharge status: Home / Self Care | Payer: Medicare Other | Source: Ambulatory Visit | Attending: Plastic Surgery | Admitting: Plastic Surgery

## 2011-04-12 DIAGNOSIS — I10 Essential (primary) hypertension: Secondary | ICD-10-CM | POA: Insufficient documentation

## 2011-04-12 DIAGNOSIS — Z4430 Encounter for fitting and adjustment of external breast prosthesis, unspecified breast: Secondary | ICD-10-CM | POA: Insufficient documentation

## 2011-04-12 DIAGNOSIS — Z01812 Encounter for preprocedural laboratory examination: Secondary | ICD-10-CM | POA: Insufficient documentation

## 2011-04-12 DIAGNOSIS — Z01810 Encounter for preprocedural cardiovascular examination: Secondary | ICD-10-CM | POA: Insufficient documentation

## 2011-04-12 DIAGNOSIS — E669 Obesity, unspecified: Secondary | ICD-10-CM | POA: Insufficient documentation

## 2011-04-12 LAB — POCT HEMOGLOBIN-HEMACUE: Hemoglobin: 14.2 g/dL (ref 12.0–15.0)

## 2011-04-20 NOTE — Op Note (Signed)
[name], [name]             ACCOUNT NO.:  [id number]  MEDICAL RECORD NO.:  [id number]  LOCATION:                                 FACILITY:  PHYSICIAN:  Wayland Denis, DO      DATE OF BIRTH:  1940-08-26  DATE OF PROCEDURE:  04/12/2011 DATE OF DISCHARGE:                              OPERATIVE REPORT   PREOPERATIVE DIAGNOSIS:  Right breast cancer.  POSTOPERATIVE DIAGNOSIS:  Right breast cancer.  PROCEDURE:  Implant exchange, right breast with capsulectomy and excision of axillary tissue, Allergan Natrelle 650 mL saline implant used.  ATTENDING:  Wayland Denis, DO  ANESTHESIA:  General.  INDICATIONS FOR PROCEDURE:  The patient is a 70 year old white female who presents for implant exchange.  She underwent a right mastectomy with immediate reconstruction using an expander and AlloDerm.  DESCRIPTION OF PROCEDURE:  The patient was taken to the operating room after consent was confirmed.  She was placed on the operating room table in the supine position.  General anesthesia was administered.  Once adequate time-out was called, all information was confirmed to be correct.  She was prepped and draped in the usual sterile fashion.  A 15- blade was used to make an elliptical incision around the previous mastectomy scar in order to remove the preexisting scar.  Once the skin was removed, it was sent to pathology.  The Bovie was then used to dissect down in an inferior direction through the pectoralis muscle to the pocket where the expander was located.  Expander was removed by evacuating the saline.  The free capsule that was little bit loose was excised.  The pocket was irrigated with saline and antibiotic solution. The lateral margin was lateral posterior and therefore in order to bring it more medially, a capsulectomy was performed.  The capsulectomy consisted of excision of the lateral aspect of the pocket in an elliptical fashion to bring the lateral margin more anterior.   This was excised with the Bovie.  Hemostasis was achieved with electrocautery. Additionally, the inframammary fold was raised half a centimeter by excision of the capsule in that area.  A 2-0 PDS was used with a running stitch to reapproximate the capsule edges of the inframammary fold and lateral margin.  Two stitches were placed at the inferior medial aspect to better define the inframammary fold medially.  The sizer was placed and expanded to 650 and then 700, finally 680 mL was chosen for the likely size.  Therefore, the 650 mL expander from Allergan, the Natrelle, medium height was chosen.  The implant was prepared according to the manufacture's guidelines, soaked in antibiotic solution.  Air was evacuated.  The implant was placed in the pocket and expanded to 680 mL of sterile injectable normal saline.  The patient was sat up to observe symmetry.  The deep layers of the muscle were then closed with 3-0 Vicryl figure-of-eight stitches were used.  A 4-0 Vicryl was then used to close the next layer and a 5-0 Monocryl with a running subcuticular stitch was used to close the skin.  Attention was then turned to the axillary tissue, 0.25% bupivacaine with epinephrine was injected after waiting several minutes for the epinephrine  to take effect.  A 15-blade was used to make an elliptical incision to excise the excess skin that was marked preoperatively.  The Bovie was used to remove this excess tissue.  Hemostasis was achieved with electrocautery.  The deep layers were closed with 3-0 Vicryl, then a 4-0 running Vicryl, and finally a 5- 0 running subcuticular Monocryl was used to reapproximate the skin edges.  Dermabond was placed on both incisions with an ABD and a breast binder.  The patient tolerated the procedure well.  There were no complications.  The capsule that was excised was sent to pathology as well as the skin.     Tammy Raschke Sanger,  DO   ______________________________ Wayland Denis, DO    CS/MEDQ  D:  04/12/2011  T:  04/12/2011  Job:  [id number]  Electronically Signed by Wayland Denis  on 04/20/2011 07:06:36 AM

## 2011-05-10 LAB — DIFFERENTIAL
Basophils Absolute: 0
Eosinophils Relative: 3
Lymphocytes Relative: 34
Lymphs Abs: 1.9
Monocytes Absolute: 0.6
Monocytes Relative: 10
Neutro Abs: 3

## 2011-05-10 LAB — COMPREHENSIVE METABOLIC PANEL
AST: 28
Albumin: 3.7
BUN: 12
Creatinine, Ser: 0.72
GFR calc Af Amer: >60
Total Protein: 6.8

## 2011-05-10 LAB — CBC
HCT: 37.7
MCV: 87.7
Platelets: 334
RDW: 13.5
WBC: 5.7

## 2011-08-02 DIAGNOSIS — J069 Acute upper respiratory infection, unspecified: Secondary | ICD-10-CM | POA: Diagnosis not present

## 2011-08-06 DIAGNOSIS — J209 Acute bronchitis, unspecified: Secondary | ICD-10-CM | POA: Diagnosis not present

## 2011-08-21 DIAGNOSIS — S83289A Other tear of lateral meniscus, current injury, unspecified knee, initial encounter: Secondary | ICD-10-CM | POA: Diagnosis not present

## 2011-08-21 DIAGNOSIS — IMO0002 Reserved for concepts with insufficient information to code with codable children: Secondary | ICD-10-CM | POA: Diagnosis not present

## 2011-08-22 DIAGNOSIS — I10 Essential (primary) hypertension: Secondary | ICD-10-CM | POA: Diagnosis not present

## 2011-08-22 DIAGNOSIS — R05 Cough: Secondary | ICD-10-CM | POA: Diagnosis not present

## 2011-08-25 DIAGNOSIS — M1710 Unilateral primary osteoarthritis, unspecified knee: Secondary | ICD-10-CM | POA: Diagnosis not present

## 2011-08-28 DIAGNOSIS — S83289A Other tear of lateral meniscus, current injury, unspecified knee, initial encounter: Secondary | ICD-10-CM | POA: Diagnosis not present

## 2011-08-28 DIAGNOSIS — IMO0002 Reserved for concepts with insufficient information to code with codable children: Secondary | ICD-10-CM | POA: Diagnosis not present

## 2011-08-30 DIAGNOSIS — G44209 Tension-type headache, unspecified, not intractable: Secondary | ICD-10-CM | POA: Diagnosis not present

## 2011-09-12 DIAGNOSIS — E669 Obesity, unspecified: Secondary | ICD-10-CM | POA: Diagnosis not present

## 2011-09-12 DIAGNOSIS — I10 Essential (primary) hypertension: Secondary | ICD-10-CM | POA: Diagnosis not present

## 2011-10-01 DIAGNOSIS — IMO0002 Reserved for concepts with insufficient information to code with codable children: Secondary | ICD-10-CM | POA: Diagnosis not present

## 2011-10-01 DIAGNOSIS — S83289A Other tear of lateral meniscus, current injury, unspecified knee, initial encounter: Secondary | ICD-10-CM | POA: Diagnosis not present

## 2011-10-29 DIAGNOSIS — S83289A Other tear of lateral meniscus, current injury, unspecified knee, initial encounter: Secondary | ICD-10-CM | POA: Diagnosis not present

## 2011-10-29 DIAGNOSIS — IMO0002 Reserved for concepts with insufficient information to code with codable children: Secondary | ICD-10-CM | POA: Diagnosis not present

## 2011-10-30 ENCOUNTER — Emergency Department (HOSPITAL_COMMUNITY)
Admission: EM | Admit: 2011-10-30 | Discharge: 2011-10-30 | Disposition: A | Discharge status: Home / Self Care | Payer: Medicare Other | Attending: Emergency Medicine | Admitting: Emergency Medicine

## 2011-10-30 ENCOUNTER — Encounter (HOSPITAL_COMMUNITY): Payer: Self-pay | Admitting: *Deleted

## 2011-10-30 DIAGNOSIS — L298 Other pruritus: Secondary | ICD-10-CM | POA: Insufficient documentation

## 2011-10-30 DIAGNOSIS — L2989 Other pruritus: Secondary | ICD-10-CM | POA: Insufficient documentation

## 2011-10-30 DIAGNOSIS — T7840XA Allergy, unspecified, initial encounter: Secondary | ICD-10-CM

## 2011-10-30 DIAGNOSIS — R220 Localized swelling, mass and lump, head: Secondary | ICD-10-CM | POA: Insufficient documentation

## 2011-10-30 DIAGNOSIS — I10 Essential (primary) hypertension: Secondary | ICD-10-CM | POA: Insufficient documentation

## 2011-10-30 DIAGNOSIS — R1310 Dysphagia, unspecified: Secondary | ICD-10-CM | POA: Insufficient documentation

## 2011-10-30 DIAGNOSIS — R110 Nausea: Secondary | ICD-10-CM | POA: Insufficient documentation

## 2011-10-30 DIAGNOSIS — T394X5A Adverse effect of antirheumatics, not elsewhere classified, initial encounter: Secondary | ICD-10-CM | POA: Insufficient documentation

## 2011-10-30 HISTORY — DX: Essential (primary) hypertension: I10

## 2011-10-30 HISTORY — DX: Malignant (primary) neoplasm, unspecified: C80.1

## 2011-10-30 MED ORDER — FAMOTIDINE IN NACL 20-0.9 MG/50ML-% IV SOLN
20.0000 mg | Freq: Once | INTRAVENOUS | Status: AC
  Administered 2011-10-30: 20 mg via INTRAVENOUS
  Filled 2011-10-30: qty 50

## 2011-10-30 MED ORDER — METHYLPREDNISOLONE SODIUM SUCC 125 MG IJ SOLR
125.0000 mg | Freq: Once | INTRAMUSCULAR | Status: AC
  Administered 2011-10-30: 125 mg via INTRAVENOUS
  Filled 2011-10-30: qty 2

## 2011-10-30 MED ORDER — ONDANSETRON HCL 4 MG/2ML IJ SOLN
4.0000 mg | Freq: Once | INTRAMUSCULAR | Status: AC
  Administered 2011-10-30: 4 mg via INTRAVENOUS
  Filled 2011-10-30: qty 2

## 2011-10-30 MED ORDER — DIPHENHYDRAMINE HCL 50 MG/ML IJ SOLN
25.0000 mg | Freq: Once | INTRAMUSCULAR | Status: AC
  Administered 2011-10-30: 25 mg via INTRAVENOUS
  Filled 2011-10-30: qty 1

## 2011-10-30 MED ORDER — FAMOTIDINE 20 MG PO TABS: 20.0000 mg | ORAL_TABLET | Freq: Two times a day (BID) | ORAL | Status: DC

## 2011-10-30 MED ORDER — DIPHENHYDRAMINE HCL 25 MG PO TABS: 25.0000 mg | ORAL_TABLET | Freq: Four times a day (QID) | ORAL | Status: DC

## 2011-10-30 MED ORDER — PREDNISONE 50 MG PO TABS: 50.0000 mg | ORAL_TABLET | Freq: Every day | ORAL | Status: AC

## 2011-10-30 NOTE — Discharge Instructions (Signed)

## 2011-10-30 NOTE — ED Provider Notes (Signed)
History     CSN: [id number]  Arrival date & time 10/30/11  1112   First MD Initiated Contact with Patient 10/30/11 1126      Chief Complaint  Patient presents with  . Allergic Reaction    (Consider location/radiation/quality/duration/timing/severity/associated sxs/prior treatment) HPI Comments: Patient states that 3 weeks ago she was started on diclofenac and after starting the medicine she started feeling and itchiness in her throat and it was difficult to swallow.  Patient did stop the medication 3 days ago.  She did see her primary care physician Dr. Pete Glatter yesterday who requested that she stop the medicine and was going to set her up with a rheumatologist.  He did not start her on any other medications.  Patient denies shortness of breath but does have some associated nausea.  Patient also stopped all of her other home medications this week.  Patient otherwise has been well without fevers, cough, chest pain or abdominal pain.  Patient is a 71 y.o. female presenting with allergic reaction. The history is provided by the patient. No language interpreter was used.  Allergic Reaction The primary symptoms are  nausea. The primary symptoms do not include wheezing, shortness of breath, cough, abdominal pain, vomiting, diarrhea, dizziness, palpitations, altered mental status, rash, angioedema or urticaria. The current episode started more than 2 days ago. The problem has not changed since onset.This is a new problem.  Significant symptoms that are not present include eye redness.    Past Medical History  Diagnosis Date  . Hypertension   . Cancer     Past Surgical History  Procedure Date  . Breast surgery     History reviewed. No pertinent family history.  History  Substance Use Topics  . Smoking status: Never Smoker   . Smokeless tobacco: Not on file  . Alcohol Use: No    OB History    Grav Para Term Preterm Abortions TAB SAB Ect Mult Living                  Review of  Systems  Constitutional: Negative.  Negative for fever and chills.  HENT: Positive for trouble swallowing.   Eyes: Negative.  Negative for discharge and redness.  Respiratory: Negative.  Negative for cough, shortness of breath and wheezing.   Cardiovascular: Negative.  Negative for chest pain and palpitations.  Gastrointestinal: Positive for nausea. Negative for vomiting, abdominal pain and diarrhea.  Genitourinary: Negative.  Negative for dysuria and vaginal discharge.  Musculoskeletal: Negative.  Negative for back pain.  Skin: Negative.  Negative for color change and rash.  Neurological: Negative.  Negative for dizziness, syncope and headaches.  Hematological: Negative.  Negative for adenopathy.  Psychiatric/Behavioral: Negative.  Negative for confusion and altered mental status.  All other systems reviewed and are negative.    Allergies  Diclofenac  Home Medications   Current Outpatient Rx  Name Route Sig Dispense Refill  . AMLODIPINE BESYLATE 5 MG PO TABS Oral Take 5 mg by mouth daily.    . ASPIRIN EC 81 MG PO TBEC Oral Take 81 mg by mouth daily.    . ATORVASTATIN CALCIUM 40 MG PO TABS Oral Take 40 mg by mouth daily.    Marland Kitchen HYDROCHLOROTHIAZIDE 25 MG PO TABS Oral Take 25 mg by mouth daily.    . MELOXICAM 15 MG PO TABS Oral Take 15 mg by mouth daily.    . ADULT MULTIVITAMIN W/MINERALS CH Oral Take 1 tablet by mouth daily.    Marland Kitchen PAROXETINE HCL  40 MG PO TABS Oral Take 40 mg by mouth every morning.    Marland Kitchen SOLIFENACIN SUCCINATE 10 MG PO TABS Oral Take 5 mg by mouth daily.      BP 154/81  Pulse 96  Temp(Src) 98 F (36.7 C) (Oral)  Resp 20  SpO2 96%  Physical Exam  Nursing note and vitals reviewed. Constitutional: She is oriented to person, place, and time. She appears well-developed and well-nourished.  Non-toxic appearance. She does not have a sickly appearance.  HENT:  Head: Normocephalic and atraumatic.  Mouth/Throat: Oropharynx is clear and moist. No oropharyngeal exudate.         No tongue swelling.  Uvula midline.  No peritonsillar swelling.  Eyes: Conjunctivae, EOM and lids are normal. Pupils are equal, round, and reactive to light. No scleral icterus.  Neck: Trachea normal and normal range of motion. Neck supple.       No palpable swelling noted on palpation of the neck.  No lymphadenopathy.  Cardiovascular: Normal rate, regular rhythm and normal heart sounds.   Pulmonary/Chest: Effort normal and breath sounds normal. No respiratory distress. She has no wheezes. She has no rales.  Abdominal: Soft. Normal appearance. There is no tenderness. There is no rebound, no guarding and no CVA tenderness.  Musculoskeletal: Normal range of motion.  Neurological: She is alert and oriented to person, place, and time. She has normal strength.  Skin: Skin is warm, dry and intact. No rash noted.  Psychiatric: She has a normal mood and affect. Her behavior is normal. Judgment and thought content normal.    ED Course  Procedures (including critical care time)  Labs Reviewed - No data to display No results found.   No diagnosis found.    MDM  Patient with possible mild allergic reaction.  I will place her on Benadryl, Pepcid and Solu-Medrol to treat for this.  She does report some nausea so she'll receive some Zofran.  Patient is no respiratory distress and appears well and has been undergoing similar symptoms for the last 2-3 weeks.  She has already stopped the potential offending medication.  Anticipate the patient was able to be discharged with instructions to continue Benadryl, Pepcid and steroids over the next few days.        Nat Christen, MD 10/30/11 1146

## 2011-10-30 NOTE — ED Notes (Signed)
Pt reports 3-4 day hx of swelling to tongue and throat. Airway intact. Pt states that it is not getting any worse its just not getting any better. Pt able to swallow saliva. Pt reports starting new medication for knee swelling.

## 2011-11-01 ENCOUNTER — Encounter (HOSPITAL_COMMUNITY): Payer: Self-pay | Admitting: General Surgery

## 2011-11-01 ENCOUNTER — Emergency Department (HOSPITAL_COMMUNITY): Payer: Medicare Other

## 2011-11-01 ENCOUNTER — Other Ambulatory Visit: Payer: Self-pay

## 2011-11-01 ENCOUNTER — Encounter (HOSPITAL_COMMUNITY): Payer: Self-pay | Admitting: Anesthesiology

## 2011-11-01 ENCOUNTER — Encounter (HOSPITAL_COMMUNITY): Admission: EM | Disposition: A | Discharge status: Skilled Nursing Facility | Payer: Self-pay | Source: Ambulatory Visit

## 2011-11-01 ENCOUNTER — Emergency Department (HOSPITAL_COMMUNITY): Payer: Medicare Other | Admitting: Anesthesiology

## 2011-11-01 ENCOUNTER — Inpatient Hospital Stay (HOSPITAL_COMMUNITY)
Admission: EM | Admit: 2011-11-01 | Discharge: 2011-11-12 | DRG: 853 | Disposition: A | Discharge status: Skilled Nursing Facility | Payer: Medicare Other | Source: Ambulatory Visit | Attending: General Surgery | Admitting: General Surgery

## 2011-11-01 DIAGNOSIS — M726 Necrotizing fasciitis: Secondary | ICD-10-CM | POA: Diagnosis present

## 2011-11-01 DIAGNOSIS — R262 Difficulty in walking, not elsewhere classified: Secondary | ICD-10-CM | POA: Diagnosis present

## 2011-11-01 DIAGNOSIS — R000 Tachycardia, unspecified: Secondary | ICD-10-CM | POA: Diagnosis present

## 2011-11-01 DIAGNOSIS — R269 Unspecified abnormalities of gait and mobility: Secondary | ICD-10-CM | POA: Diagnosis not present

## 2011-11-01 DIAGNOSIS — K612 Anorectal abscess: Secondary | ICD-10-CM | POA: Diagnosis not present

## 2011-11-01 DIAGNOSIS — N72 Inflammatory disease of cervix uteri: Secondary | ICD-10-CM | POA: Diagnosis present

## 2011-11-01 DIAGNOSIS — L0291 Cutaneous abscess, unspecified: Secondary | ICD-10-CM | POA: Diagnosis not present

## 2011-11-01 DIAGNOSIS — IMO0002 Reserved for concepts with insufficient information to code with codable children: Secondary | ICD-10-CM | POA: Diagnosis present

## 2011-11-01 DIAGNOSIS — N760 Acute vaginitis: Secondary | ICD-10-CM | POA: Diagnosis present

## 2011-11-01 DIAGNOSIS — N7689 Other specified inflammation of vagina and vulva: Secondary | ICD-10-CM | POA: Diagnosis present

## 2011-11-01 DIAGNOSIS — E876 Hypokalemia: Secondary | ICD-10-CM | POA: Diagnosis not present

## 2011-11-01 DIAGNOSIS — D72829 Elevated white blood cell count, unspecified: Secondary | ICD-10-CM | POA: Diagnosis not present

## 2011-11-01 DIAGNOSIS — I499 Cardiac arrhythmia, unspecified: Secondary | ICD-10-CM | POA: Diagnosis not present

## 2011-11-01 DIAGNOSIS — M1710 Unilateral primary osteoarthritis, unspecified knee: Secondary | ICD-10-CM | POA: Diagnosis present

## 2011-11-01 DIAGNOSIS — R188 Other ascites: Secondary | ICD-10-CM | POA: Diagnosis not present

## 2011-11-01 DIAGNOSIS — I4891 Unspecified atrial fibrillation: Secondary | ICD-10-CM | POA: Diagnosis not present

## 2011-11-01 DIAGNOSIS — Z9010 Acquired absence of unspecified breast and nipple: Secondary | ICD-10-CM

## 2011-11-01 DIAGNOSIS — M169 Osteoarthritis of hip, unspecified: Secondary | ICD-10-CM | POA: Diagnosis not present

## 2011-11-01 DIAGNOSIS — M6281 Muscle weakness (generalized): Secondary | ICD-10-CM | POA: Diagnosis not present

## 2011-11-01 DIAGNOSIS — M799 Soft tissue disorder, unspecified: Secondary | ICD-10-CM | POA: Diagnosis not present

## 2011-11-01 DIAGNOSIS — E039 Hypothyroidism, unspecified: Secondary | ICD-10-CM | POA: Diagnosis present

## 2011-11-01 DIAGNOSIS — Z7982 Long term (current) use of aspirin: Secondary | ICD-10-CM

## 2011-11-01 DIAGNOSIS — Z853 Personal history of malignant neoplasm of breast: Secondary | ICD-10-CM

## 2011-11-01 DIAGNOSIS — N739 Female pelvic inflammatory disease, unspecified: Secondary | ICD-10-CM | POA: Diagnosis not present

## 2011-11-01 DIAGNOSIS — I10 Essential (primary) hypertension: Secondary | ICD-10-CM | POA: Diagnosis present

## 2011-11-01 DIAGNOSIS — N764 Abscess of vulva: Secondary | ICD-10-CM | POA: Diagnosis not present

## 2011-11-01 DIAGNOSIS — N289 Disorder of kidney and ureter, unspecified: Secondary | ICD-10-CM | POA: Diagnosis not present

## 2011-11-01 DIAGNOSIS — L02219 Cutaneous abscess of trunk, unspecified: Secondary | ICD-10-CM | POA: Diagnosis present

## 2011-11-01 DIAGNOSIS — L0390 Cellulitis, unspecified: Secondary | ICD-10-CM | POA: Diagnosis not present

## 2011-11-01 DIAGNOSIS — I482 Chronic atrial fibrillation, unspecified: Secondary | ICD-10-CM | POA: Diagnosis present

## 2011-11-01 DIAGNOSIS — I5030 Unspecified diastolic (congestive) heart failure: Secondary | ICD-10-CM | POA: Diagnosis not present

## 2011-11-01 DIAGNOSIS — Z5189 Encounter for other specified aftercare: Secondary | ICD-10-CM | POA: Diagnosis not present

## 2011-11-01 DIAGNOSIS — Z01810 Encounter for preprocedural cardiovascular examination: Secondary | ICD-10-CM | POA: Diagnosis not present

## 2011-11-01 DIAGNOSIS — E785 Hyperlipidemia, unspecified: Secondary | ICD-10-CM | POA: Insufficient documentation

## 2011-11-01 DIAGNOSIS — C50919 Malignant neoplasm of unspecified site of unspecified female breast: Secondary | ICD-10-CM | POA: Insufficient documentation

## 2011-11-01 DIAGNOSIS — I5031 Acute diastolic (congestive) heart failure: Secondary | ICD-10-CM | POA: Diagnosis not present

## 2011-11-01 DIAGNOSIS — B999 Unspecified infectious disease: Secondary | ICD-10-CM | POA: Diagnosis not present

## 2011-11-01 DIAGNOSIS — A419 Sepsis, unspecified organism: Secondary | ICD-10-CM | POA: Diagnosis not present

## 2011-11-01 DIAGNOSIS — L03319 Cellulitis of trunk, unspecified: Secondary | ICD-10-CM | POA: Diagnosis present

## 2011-11-01 DIAGNOSIS — A499 Bacterial infection, unspecified: Secondary | ICD-10-CM | POA: Diagnosis not present

## 2011-11-01 DIAGNOSIS — N9089 Other specified noninflammatory disorders of vulva and perineum: Secondary | ICD-10-CM | POA: Diagnosis not present

## 2011-11-01 DIAGNOSIS — R0602 Shortness of breath: Secondary | ICD-10-CM | POA: Diagnosis not present

## 2011-11-01 DIAGNOSIS — A4159 Other Gram-negative sepsis: Secondary | ICD-10-CM | POA: Diagnosis not present

## 2011-11-01 HISTORY — PX: INCISION AND DRAINAGE PERIRECTAL ABSCESS: SHX1804

## 2011-11-01 HISTORY — DX: Cardiac arrhythmia, unspecified: I49.9

## 2011-11-01 HISTORY — DX: Stenosis of anus and rectum: K62.4

## 2011-11-01 HISTORY — DX: Sleep apnea, unspecified: G47.30

## 2011-11-01 HISTORY — DX: Hyperlipidemia, unspecified: E78.5

## 2011-11-01 HISTORY — DX: Unspecified osteoarthritis, unspecified site: M19.90

## 2011-11-01 LAB — BASIC METABOLIC PANEL
BUN: 13 mg/dL (ref 6–23)
BUN: 12 mg/dL (ref 6–23)
CO2: 29 mEq/L (ref 19–32)
CO2: 27 mEq/L (ref 19–32)
CO2: 29 mEq/L (ref 19–32)
Calcium: 9.1 mg/dL (ref 8.4–10.5)
Calcium: 8 mg/dL — ABNORMAL LOW (ref 8.4–10.5)
Chloride: 87 mEq/L — ABNORMAL LOW (ref 96–112)
Chloride: 95 mEq/L — ABNORMAL LOW (ref 96–112)
Creatinine, Ser: 0.66 mg/dL (ref 0.50–1.10)
Creatinine, Ser: 0.64 mg/dL (ref 0.50–1.10)
Glucose, Bld: 143 mg/dL — ABNORMAL HIGH (ref 70–99)
Glucose, Bld: 175 mg/dL — ABNORMAL HIGH (ref 70–99)
Sodium: 131 mEq/L — ABNORMAL LOW (ref 135–145)

## 2011-11-01 LAB — CBC
HCT: 38.2 % (ref 36.0–46.0)
HCT: 32.9 % — ABNORMAL LOW (ref 36.0–46.0)
Hemoglobin: 13.4 g/dL (ref 12.0–15.0)
MCV: 86.8 fL (ref 78.0–100.0)
RDW: 13.3 % (ref 11.5–15.5)
WBC: 19.3 10*3/uL — ABNORMAL HIGH (ref 4.0–10.5)
WBC: 21.9 10*3/uL — ABNORMAL HIGH (ref 4.0–10.5)

## 2011-11-01 LAB — DIFFERENTIAL
Basophils Absolute: 0 10*3/uL (ref 0.0–0.1)
Basophils Absolute: 0 10*3/uL (ref 0.0–0.1)
Eosinophils Relative: 0 % (ref 0–5)
Lymphocytes Relative: 4 % — ABNORMAL LOW (ref 12–46)
Lymphocytes Relative: 7 % — ABNORMAL LOW (ref 12–46)
Monocytes Absolute: 0.7 10*3/uL (ref 0.1–1.0)
Monocytes Absolute: 0.9 10*3/uL (ref 0.1–1.0)
Monocytes Relative: 4 % (ref 3–12)
Monocytes Relative: 4 % (ref 3–12)
Neutro Abs: 17.1 10*3/uL — ABNORMAL HIGH (ref 1.7–7.7)

## 2011-11-01 LAB — POCT I-STAT 7, (LYTES, BLD GAS, ICA,H+H)
Acid-Base Excess: 5 mmol/L — ABNORMAL HIGH (ref 0.0–2.0)
Calcium, Ion: 1.06 mmol/L — ABNORMAL LOW (ref 1.12–1.32)
O2 Saturation: 99 %
Potassium: 2.9 mEq/L — ABNORMAL LOW (ref 3.5–5.1)

## 2011-11-01 LAB — PROCALCITONIN: Procalcitonin: 0.43 ng/mL

## 2011-11-01 LAB — GRAM STAIN

## 2011-11-01 LAB — MRSA PCR SCREENING: MRSA by PCR: NEGATIVE

## 2011-11-01 SURGERY — INCISION AND DRAINAGE, ABSCESS, PERIRECTAL
Anesthesia: General | Site: Perineum | Wound class: Dirty or Infected

## 2011-11-01 MED ORDER — PIPERACILLIN-TAZOBACTAM 3.375 G IVPB
3.3750 g | Freq: Three times a day (TID) | INTRAVENOUS | Status: DC
  Administered 2011-11-01 – 2011-11-05 (×11): 3.375 g via INTRAVENOUS
  Filled 2011-11-01 (×14): qty 50

## 2011-11-01 MED ORDER — HYDROGEN PEROXIDE 3 % EX SOLN
CUTANEOUS | Status: DC | PRN
  Administered 2011-11-01: 1

## 2011-11-01 MED ORDER — PANTOPRAZOLE SODIUM 40 MG IV SOLR: 40.0000 mg | Freq: Every day | INTRAVENOUS | Status: DC

## 2011-11-01 MED ORDER — PIPERACILLIN-TAZOBACTAM 3.375 G IVPB
INTRAVENOUS | Status: AC
  Administered 2011-11-01: 3.375 g via INTRAVENOUS
  Filled 2011-11-01: qty 50

## 2011-11-01 MED ORDER — METOPROLOL TARTRATE 1 MG/ML IV SOLN
5.0000 mg | Freq: Four times a day (QID) | INTRAVENOUS | Status: DC | PRN
  Filled 2011-11-01: qty 5

## 2011-11-01 MED ORDER — SODIUM CHLORIDE 0.9 % IV BOLUS (SEPSIS)
1000.0000 mL | Freq: Once | INTRAVENOUS | Status: AC
  Administered 2011-11-01: 1000 mL via INTRAVENOUS

## 2011-11-01 MED ORDER — GENTAMICIN SULFATE 40 MG/ML IJ SOLN: 1.5000 mg/kg | Freq: Once | INTRAMUSCULAR | Status: DC

## 2011-11-01 MED ORDER — FENTANYL CITRATE 0.05 MG/ML IJ SOLN
INTRAMUSCULAR | Status: DC | PRN
  Administered 2011-11-01: 100 ug via INTRAVENOUS
  Administered 2011-11-01: 150 ug via INTRAVENOUS

## 2011-11-01 MED ORDER — SUCCINYLCHOLINE CHLORIDE 20 MG/ML IJ SOLN
INTRAMUSCULAR | Status: DC | PRN
  Administered 2011-11-01: 120 mg via INTRAVENOUS

## 2011-11-01 MED ORDER — VANCOMYCIN HCL IN DEXTROSE 1-5 GM/200ML-% IV SOLN
1000.0000 mg | Freq: Once | INTRAVENOUS | Status: AC
  Administered 2011-11-01: 1000 mg via INTRAVENOUS
  Filled 2011-11-01: qty 200

## 2011-11-01 MED ORDER — LACTATED RINGERS IV SOLN
INTRAVENOUS | Status: DC | PRN
  Administered 2011-11-01: 16:00:00 via INTRAVENOUS

## 2011-11-01 MED ORDER — ONDANSETRON HCL 4 MG/2ML IJ SOLN
INTRAMUSCULAR | Status: DC | PRN
  Administered 2011-11-01: 4 mg via INTRAVENOUS

## 2011-11-01 MED ORDER — KCL IN DEXTROSE-NACL 20-5-0.45 MEQ/L-%-% IV SOLN
INTRAVENOUS | Status: DC
  Administered 2011-11-02 – 2011-11-03 (×2): via INTRAVENOUS
  Filled 2011-11-01 (×8): qty 1000

## 2011-11-01 MED ORDER — POTASSIUM CHLORIDE 10 MEQ/100ML IV SOLN
INTRAVENOUS | Status: DC | PRN
  Administered 2011-11-01 (×2): 10 meq via INTRAVENOUS

## 2011-11-01 MED ORDER — LACTATED RINGERS IV SOLN
INTRAVENOUS | Status: DC | PRN
  Administered 2011-11-01 (×2): via INTRAVENOUS

## 2011-11-01 MED ORDER — ETOMIDATE 2 MG/ML IV SOLN
INTRAVENOUS | Status: DC | PRN
  Administered 2011-11-01: 20 mg via INTRAVENOUS

## 2011-11-01 MED ORDER — DIPHENHYDRAMINE HCL 12.5 MG/5ML PO ELIX
12.5000 mg | ORAL_SOLUTION | Freq: Four times a day (QID) | ORAL | Status: DC | PRN
  Filled 2011-11-01: qty 5

## 2011-11-01 MED ORDER — DIPHENHYDRAMINE HCL 50 MG/ML IJ SOLN: 12.5000 mg | Freq: Four times a day (QID) | INTRAMUSCULAR | Status: DC | PRN

## 2011-11-01 MED ORDER — ONDANSETRON HCL 4 MG/2ML IJ SOLN
4.0000 mg | Freq: Once | INTRAMUSCULAR | Status: AC
  Administered 2011-11-01: 4 mg via INTRAVENOUS
  Filled 2011-11-01: qty 2

## 2011-11-01 MED ORDER — MORPHINE SULFATE 2 MG/ML IJ SOLN
1.0000 mg | INTRAMUSCULAR | Status: DC | PRN
  Administered 2011-11-03 – 2011-11-04 (×6): 2 mg via INTRAVENOUS
  Filled 2011-11-01 (×7): qty 1

## 2011-11-01 MED ORDER — MORPHINE SULFATE 2 MG/ML IJ SOLN: 1.0000 mg | INTRAMUSCULAR | Status: DC | PRN

## 2011-11-01 MED ORDER — DEXTROSE 5 % IV SOLN
1.0000 g | Freq: Once | INTRAVENOUS | Status: AC
  Administered 2011-11-01: 1 g via INTRAVENOUS
  Filled 2011-11-01: qty 10

## 2011-11-01 MED ORDER — HYDROMORPHONE HCL PF 1 MG/ML IJ SOLN
0.2500 mg | INTRAMUSCULAR | Status: DC | PRN
Start: 2011-11-01 — End: 2011-11-01

## 2011-11-01 MED ORDER — ONDANSETRON HCL 4 MG/2ML IJ SOLN: 4.0000 mg | Freq: Four times a day (QID) | INTRAMUSCULAR | Status: DC | PRN

## 2011-11-01 MED ORDER — POTASSIUM CHLORIDE 10 MEQ/100ML IV SOLN: 10.0000 meq | INTRAVENOUS | Status: DC

## 2011-11-01 MED ORDER — VANCOMYCIN HCL 1000 MG IV SOLR
1250.0000 mg | Freq: Two times a day (BID) | INTRAVENOUS | Status: DC
  Administered 2011-11-02 – 2011-11-04 (×6): 1250 mg via INTRAVENOUS
  Filled 2011-11-01 (×9): qty 1250

## 2011-11-01 MED ORDER — MORPHINE SULFATE 2 MG/ML IJ SOLN: 0.0500 mg/kg | INTRAMUSCULAR | Status: DC | PRN

## 2011-11-01 MED ORDER — 0.9 % SODIUM CHLORIDE (POUR BTL) OPTIME
TOPICAL | Status: DC | PRN
  Administered 2011-11-01: 1000 mL

## 2011-11-01 MED ORDER — POTASSIUM CHLORIDE 10 MEQ/100ML IV SOLN
10.0000 meq | Freq: Once | INTRAVENOUS | Status: AC
  Administered 2011-11-01: 10 meq via INTRAVENOUS
  Filled 2011-11-01: qty 100

## 2011-11-01 MED ORDER — IOHEXOL 300 MG/ML  SOLN
100.0000 mL | Freq: Once | INTRAMUSCULAR | Status: AC | PRN
  Administered 2011-11-01: 100 mL via INTRAVENOUS

## 2011-11-01 MED ORDER — HYDROMORPHONE HCL PF 1 MG/ML IJ SOLN
1.0000 mg | Freq: Once | INTRAMUSCULAR | Status: AC
  Administered 2011-11-01: 1 mg via INTRAVENOUS
  Filled 2011-11-01: qty 1

## 2011-11-01 MED ORDER — ENOXAPARIN SODIUM 40 MG/0.4ML ~~LOC~~ SOLN
40.0000 mg | SUBCUTANEOUS | Status: DC
  Administered 2011-11-02 – 2011-11-08 (×7): 40 mg via SUBCUTANEOUS
  Filled 2011-11-01 (×10): qty 0.4

## 2011-11-01 MED ORDER — KCL IN DEXTROSE-NACL 20-5-0.45 MEQ/L-%-% IV SOLN
INTRAVENOUS | Status: DC
  Filled 2011-11-01 (×3): qty 1000

## 2011-11-01 MED ORDER — ONDANSETRON HCL 4 MG PO TABS: 4.0000 mg | ORAL_TABLET | Freq: Four times a day (QID) | ORAL | Status: DC | PRN

## 2011-11-01 SURGICAL SUPPLY — 40 items
BANDAGE GAUZE ELAST BULKY 4 IN (GAUZE/BANDAGES/DRESSINGS) ×2 IMPLANT
BLADE SURG 15 STRL LF DISP TIS (BLADE) ×1 IMPLANT
BLADE SURG 15 STRL SS (BLADE) ×1
CANISTER SUCTION 2500CC (MISCELLANEOUS) ×2 IMPLANT
CLEANER TIP ELECTROSURG 2X2 (MISCELLANEOUS) ×2 IMPLANT
CLOTH BEACON ORANGE TIMEOUT ST (SAFETY) ×2 IMPLANT
COVER SURGICAL LIGHT HANDLE (MISCELLANEOUS) ×2 IMPLANT
DRAPE LAPAROSCOPIC ABDOMINAL (DRAPES) ×2 IMPLANT
DRAPE PROXIMA HALF (DRAPES) ×2 IMPLANT
DRAPE UTILITY 15X26 W/TAPE STR (DRAPE) ×4 IMPLANT
DRSG PAD ABDOMINAL 8X10 ST (GAUZE/BANDAGES/DRESSINGS) ×4 IMPLANT
ELECT REM PT RETURN 9FT ADLT (ELECTROSURGICAL) ×2
ELECTRODE REM PT RTRN 9FT ADLT (ELECTROSURGICAL) ×1 IMPLANT
GAUZE PACKING IODOFORM 1 (PACKING) IMPLANT
GAUZE SPONGE 4X4 16PLY XRAY LF (GAUZE/BANDAGES/DRESSINGS) ×2 IMPLANT
GLOVE BIO SURGEON STRL SZ7.5 (GLOVE) ×2 IMPLANT
GLOVE SURG SIGNA 7.5 PF LTX (GLOVE) ×2 IMPLANT
GOWN STRL NON-REIN LRG LVL3 (GOWN DISPOSABLE) ×4 IMPLANT
GOWN STRL REIN XL XLG (GOWN DISPOSABLE) ×2 IMPLANT
HYDROGEN PEROXIDE 16OZ (MISCELLANEOUS) ×2 IMPLANT
KIT BASIN OR (CUSTOM PROCEDURE TRAY) ×2 IMPLANT
KIT ROOM TURNOVER OR (KITS) ×2 IMPLANT
NS IRRIG 1000ML POUR BTL (IV SOLUTION) ×2 IMPLANT
PACK LITHOTOMY IV (CUSTOM PROCEDURE TRAY) ×2 IMPLANT
PAD ARMBOARD 7.5X6 YLW CONV (MISCELLANEOUS) ×4 IMPLANT
PENCIL BUTTON HOLSTER BLD 10FT (ELECTRODE) ×2 IMPLANT
SPONGE GAUZE 4X4 12PLY (GAUZE/BANDAGES/DRESSINGS) ×2 IMPLANT
SPONGE LAP 18X18 X RAY DECT (DISPOSABLE) ×6 IMPLANT
SWAB COLLECTION DEVICE MRSA (MISCELLANEOUS) ×2 IMPLANT
SYR BULB 3OZ (MISCELLANEOUS) ×2 IMPLANT
SYR BULB IRRIGATION 50ML (SYRINGE) ×2 IMPLANT
TAPE CLOTH SURG 4X10 WHT LF (GAUZE/BANDAGES/DRESSINGS) ×2 IMPLANT
TOWEL OR 17X24 6PK STRL BLUE (TOWEL DISPOSABLE) ×2 IMPLANT
TOWEL OR 17X26 10 PK STRL BLUE (TOWEL DISPOSABLE) ×2 IMPLANT
TRAY FOLEY CATH 14FRSI W/METER (CATHETERS) ×2 IMPLANT
TUBE ANAEROBIC SPECIMEN COL (MISCELLANEOUS) ×2 IMPLANT
TUBE CONNECTING 12X1/4 (SUCTIONS) ×2 IMPLANT
UNDERPAD 30X30 INCONTINENT (UNDERPADS AND DIAPERS) ×2 IMPLANT
WATER STERILE IRR 1000ML POUR (IV SOLUTION) ×2 IMPLANT
YANKAUER SUCT BULB TIP NO VENT (SUCTIONS) ×2 IMPLANT

## 2011-11-01 NOTE — ED Notes (Signed)
LAB AT BEDSIDE. WILL START ATB WHEN THEY ARE FINISHED DRAWING CULTURES

## 2011-11-01 NOTE — Anesthesia Preprocedure Evaluation (Addendum)
Anesthesia Evaluation  Patient identified by MRN, date of birth, ID band Patient awake    Reviewed: Allergy & Precautions, H&P , NPO status , Patient's Chart, lab work & pertinent test results  Airway Mallampati: II      Dental  (+) Teeth Intact and Dental Advisory Given   Pulmonary neg pulmonary ROS,  breath sounds clear to auscultation        Cardiovascular hypertension, Pt. on medications + dysrhythmias Atrial Fibrillation Rhythm:Irregular Rate:Normal  New onset atrial fib as per patient and family awaiting cardiology consult with Dr. Mayford Knife.   Neuro/Psych    GI/Hepatic negative GI ROS, Neg liver ROS,   Endo/Other  negative endocrine ROS  Renal/GU negative Renal ROS     Musculoskeletal   Abdominal   Peds  Hematology negative hematology ROS (+)   Anesthesia Other Findings   Reproductive/Obstetrics                         Anesthesia Physical Anesthesia Plan  ASA: IV  Anesthesia Plan: General   Post-op Pain Management:    Induction: Intravenous  Airway Management Planned: Oral ETT  Additional Equipment: Arterial line  Intra-op Plan:   Post-operative Plan: Possible Post-op intubation/ventilation  Informed Consent: I have reviewed the patients History and Physical, chart, labs and discussed the procedure including the risks, benefits and alternatives for the proposed anesthesia with the patient or authorized representative who has indicated his/her understanding and acceptance.   Dental advisory given  Plan Discussed with: CRNA  Anesthesia Plan Comments:        Anesthesia Quick Evaluation

## 2011-11-01 NOTE — ED Notes (Signed)
2301-01 Ready 

## 2011-11-01 NOTE — Anesthesia Postprocedure Evaluation (Signed)
  Anesthesia Post-op Note  Patient: [name]  Procedure(s) Performed: Procedure(s) (LRB): IRRIGATION AND DEBRIDEMENT PERIRECTAL ABSCESS (N/A)  Patient Location: PACU  Anesthesia Type: General  Level of Consciousness: awake, alert  and oriented  Airway and Oxygen Therapy: Patient Spontanous Breathing and Patient connected to nasal cannula oxygen  Post-op Pain: mild  Post-op Assessment: Post-op Vital signs reviewed, Patient's Cardiovascular Status Stable, Respiratory Function Stable, Patent Airway, No signs of Nausea or vomiting and Pain level controlled  Post-op Vital Signs: Reviewed and stable  Complications: No apparent anesthesia complications

## 2011-11-01 NOTE — H&P (Signed)
[name] 07/17/1941  [id number].   Primary Care MD: Dr. Merlene Laughter Requesting MD: Dr. Patria Mane, ER physician  Chief Complaint/Reason for Consult: necrotizing fasciitis HPI: This is a very pleasant 71 yo female with a history of HTN and breast cancer.  About 10 days ago she was placed on Diclofenac secondary to knee arthritis.  She had an allergic reaction to this with difficult swallowing and breathing.  She came to the [facility] on Tuesday for evaluation and she was given predisone and other medications for her allergic reaction.    The husband provides much of the history as the patient is very sedated and unable to provide any significant history.  He is unsure exactly when this problem started.  She said something to him about some pain in her perineum on Tuesday, but made no other comments.  He thinks she said that she initially started having some pain 3-4 days ago.  She saw her PCP this morning and was immediately sent over to [facility] for evaluation.  She had a CT scan which revealed diffuse air in her mons pubis area, left labia tracking posterior towards her rectum and down her left thigh.  We were immediately called to see the patient for admission.  He denies any fevers or chills.  Some occasional SOB secondary to initial allergic reaction.  No chest pain.  Review of systems: Please see HPI, otherwise all other systems are negative, except decreased appetite over last several days.  History reviewed. No pertinent family history.  Past Medical History  Diagnosis Date  . Hypertension   . Hyperlipidemia   . Cancer     breast cancer  . Anal stenosis     Past Surgical History  Procedure Date  . Breast surgery     right mastectomy; left lumpectomy  . Anal dilation     multiple  . Abdominal hysterectomy   . Joint replacement     right knee  laparotomy for intussusception  Social History:  reports that she has never smoked. She does not have any smokeless tobacco history on  file. She reports that she drinks alcohol. Her drug history not on file.  Allergies:  Allergies  Allergen Reactions  . Diclofenac Itching    Feels difficult to swallow, but no tongue swelling or SOB    Medications Prior to Admission  Medication Dose Route Frequency Provider Last Rate Last Dose  . cefTRIAXone (ROCEPHIN) 1 g in dextrose 5 % 50 mL IVPB  1 g Intravenous Once Scarlette Calico C. [place], PA   1 g at 11/01/11 1135  . dextrose 5 % and 0.45 % NaCl with KCl 20 mEq/L infusion   Intravenous Continuous Letha Cape, PA      . diphenhydrAMINE (BENADRYL) injection 12.5 mg  12.5 mg Intravenous Q6H PRN Letha Cape, PA       Or  . diphenhydrAMINE (BENADRYL) 12.5 MG/5ML elixir 12.5 mg  12.5 mg Oral Q6H PRN Letha Cape, PA      . HYDROmorphone (DILAUDID) injection 1 mg  1 mg Intravenous Once Scarlette Calico C. [place], [place]   1 mg at 11/01/11 1132  . iohexol (OMNIPAQUE) 300 MG/ML solution 100 mL  100 mL Intravenous Once PRN Medication Radiologist, MD   100 mL at 11/01/11 1245  . morphine 2 MG/ML injection 1-4 mg  1-4 mg Intravenous Q2H PRN Letha Cape, PA      . ondansetron [facility]) injection 4 mg  4 mg Intravenous Once Scarlette Calico C. [place], [place]  4 mg at 11/01/11 1132  . ondansetron (ZOFRAN) injection 4 mg  4 mg Intravenous Q6H PRN Letha Cape, PA      . pantoprazole (PROTONIX) injection 40 mg  40 mg Intravenous QHS Letha Cape, PA      . piperacillin-tazobactam (ZOSYN) 3-0.375 GM/50ML IVPB        3.375 g at 11/01/11 1400  . potassium chloride 10 mEq in 100 mL IVPB  10 mEq Intravenous Once Scarlette Calico C. [place], PA   10 mEq at 11/01/11 1341  . potassium chloride 10 mEq in 100 mL IVPB  10 mEq Intravenous Once Scarlette Calico C. [place], [place]      . potassium chloride 10 mEq in 100 mL IVPB  10 mEq Intravenous Q1 Hr x 4 Letha Cape, PA      . sodium chloride 0.9 % bolus 1,000 mL  1,000 mL Intravenous Once Scarlette Calico C. [place], PA   1,000 mL at 11/01/11 1132  . vancomycin (VANCOCIN) IVPB 1000 mg/200 mL  premix  1,000 mg Intravenous Once Scarlette Calico C. [place], PA   1,000 mg at 11/01/11 1341  . DISCONTD: gentamicin (GARAMYCIN) 1.5 mg/kg in dextrose 5 % 50 mL IVPB  1.5 mg/kg Intravenous Once Lyanne Co, MD       Medications Prior to Admission  Medication Sig Dispense Refill  . amLODipine (NORVASC) 5 MG tablet Take 5 mg by mouth daily.      Marland Kitchen aspirin EC 81 MG tablet Take 81 mg by mouth daily.      Marland Kitchen atorvastatin (LIPITOR) 40 MG tablet Take 40 mg by mouth daily.      . diphenhydrAMINE (BENADRYL) 25 MG tablet Take 1 tablet (25 mg total) by mouth every 6 (six) hours.  20 tablet  0  . famotidine (PEPCID) 20 MG tablet Take 1 tablet (20 mg total) by mouth 2 (two) times daily.  14 tablet  0  . hydrochlorothiazide (HYDRODIURIL) 25 MG tablet Take 25 mg by mouth daily.      . meloxicam (MOBIC) 15 MG tablet Take 15 mg by mouth daily.      . Multiple Vitamin (MULITIVITAMIN WITH MINERALS) TABS Take 1 tablet by mouth daily.      Marland Kitchen PARoxetine (PAXIL) 40 MG tablet Take 40 mg by mouth every morning.      . predniSONE (DELTASONE) 50 MG tablet Take 1 tablet (50 mg total) by mouth daily.  5 tablet  0  . solifenacin (VESICARE) 10 MG tablet Take 10 mg by mouth daily.         Blood pressure 132/63, pulse 103, temperature 99.3 F (37.4 C), temperature source Oral, resp. rate 18, SpO2 92.00%. Physical Exam: General: pleasant, WD, WN white female who is laying in bed very sedated. HEENT: head is normocephalic, atraumatic.  Sclera are noninjected.  PERRL.  Ears and nose without any masses or lesions.  Mouth is pink and moist Heart: irregularly, irregular, tachycardic.  Normal s1,s2. No obvious murmurs, gallops, or rubs noted.  Palpable radial and pedal pulses bilaterally Lungs: CTAB, no wheezes, rhonchi, or rales noted.  Respiratory effort nonlabored Abd: soft, NT, ND, +BS, no masses, hernias, or organomegaly.  paramidline scar noted from surgery as a child GU: significant erythema and induration of mons pubis,  extending into her left labia, tracking back through her perineum and into her buttock.  She also has erythema tracking down her left thigh with some areas of early skin necrosis.  No frank crepitus or air if acutely palpable  given her significant induration. MS: all 4 extremities are symmetrical with no cyanosis, clubbing, or edema. Skin: warm and dry with no masses, lesions, or rashes Psych: A&Ox3 with an appropriate affect.    Results for orders placed during the hospital encounter of 11/01/11 (from the past 48 hour(s))  CBC     Status: Abnormal   Collection Time   11/01/11 11:21 AM      Component Value Range Comment   WBC 19.3 (*) 4.0 - 10.5 (K/uL)    RBC 4.44  3.87 - 5.11 (MIL/uL)    Hemoglobin 13.4  12.0 - 15.0 (g/dL)    HCT 21.3  08.6 - 57.8 (%)    MCV 86.0  78.0 - 100.0 (fL)    MCH 30.2  26.0 - 34.0 (pg)    MCHC 35.1  30.0 - 36.0 (g/dL)    RDW 46.9  62.9 - 52.8 (%)    Platelets 343  150 - 400 (K/uL)   DIFFERENTIAL     Status: Abnormal   Collection Time   11/01/11 11:21 AM      Component Value Range Comment   Neutrophils Relative 92 (*) 43 - 77 (%)    Neutro Abs 17.1 (*) 1.7 - 7.7 (K/uL)    Lymphocytes Relative 4 (*) 12 - 46 (%)    Lymphs Abs 0.8  0.7 - 4.0 (K/uL)    Monocytes Relative 4  3 - 12 (%)    Monocytes Absolute 0.7  0.1 - 1.0 (K/uL)    Eosinophils Relative 0  0 - 5 (%)    Eosinophils Absolute 0.0  0.0 - 0.7 (K/uL)    Basophils Relative 0  0 - 1 (%)    Basophils Absolute 0.0  0.0 - 0.1 (K/uL)   BASIC METABOLIC PANEL     Status: Abnormal   Collection Time   11/01/11 11:21 AM      Component Value Range Comment   Sodium 131 (*) 135 - 145 (mEq/L)    Potassium 2.5 (*) 3.5 - 5.1 (mEq/L)    Chloride 87 (*) 96 - 112 (mEq/L)    CO2 29  19 - 32 (mEq/L)    Glucose, Bld 143 (*) 70 - 99 (mg/dL)    BUN 16  6 - 23 (mg/dL)    Creatinine, Ser 4.13  0.50 - 1.10 (mg/dL)    Calcium 9.1  8.4 - 10.5 (mg/dL)    GFR calc non Af Amer 82 (*) >90 (mL/min)    GFR calc Af Amer >90  >90  (mL/min)   LACTIC ACID, PLASMA     Status: Normal   Collection Time   11/01/11  1:04 PM      Component Value Range Comment   Lactic Acid, Venous 1.6  0.5 - 2.2 (mmol/L)   PROCALCITONIN     Status: Normal   Collection Time   11/01/11  1:04 PM      Component Value Range Comment   Procalcitonin 0.43      Dg Chest 1 View  11/01/2011  *RADIOLOGY REPORT*  Clinical Data: Shortness of breath.  CHEST - 1 VIEW  Comparison: 04/10/2011  Findings: There is peribronchial thickening.  Low lung volumes. Heart size is accentuated by the low volumes.  No effusions or acute bony abnormality.  IMPRESSION: Low lung volumes, peribronchial thickening.  Original Report Authenticated By: Cyndie Chime, M.D.   Ct Pelvis W Contrast  11/01/2011  *RADIOLOGY REPORT*  Clinical Data:  Suspected left labial/perirectal abscess  CT PELVIS WITH  CONTRAST  Technique:  Multidetector CT imaging of the pelvis was performed using the standard protocol following the bolus administration of intravenous contrast.  Contrast:   100 ml Omnipaque-300 IV  Comparison:  None.  Findings:  No drainable fluid collection or abscess.  Extensive soft tissue gas predominately involving the left labial/peritoneal region (series 2/image 51), highly suspicious for necrotizing fasciitis/Fournier's gangrene. Gas extends into the left lower abdominopelvic wall (series 2/image 36), left medial thigh (series 2/image 61), bilateral ischiorectal fossa (series 2/image 41), and extending into the right lower pelvis (series 2/image 30) via the obturator internus.  Of note, the gas in the left medial thigh is incompletely visualized in its inferior extent.  Layering contrast in the bladder.  Status post hysterectomy.  No adnexal masses.  Colonic diverticulosis, without associate inflammatory changes.  No pelvic ascites.  No suspicious pelvic lymphadenopathy.  Degenerative changes at L4-5.  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 Emergent surgical and ID consultation is suggested.  Critical Value/emergent results were called by telephone at the time of interpretation on 11/01/2011  at 1254 hours  to  Dr. Azalia Bilis, who verbally acknowledged these results.  Original Report Authenticated By: Charline Bills, M.D.       Assessment/Plan 1. Necrotizing fasciitis of perineum 2. Sepsis 3. New onset Atrial Fibrillation 4. HTN 5. H/o breast cancer (disease free) 6.  History of rectal stricture - treated by Dr. Cindee Lame in 04/2007. 7.  Recent reaction to diclofenac.  Plan: 1. The patient will need emergent surgical intervention for her necrotizing and very severe infection.  She will be started on Vanc and Zosyn.  We have discussed all of this with the patient and the husband as the patient is very sedated.  Her EKG reveals new onset A. Fib.  Cardiology has been requested to see the patient postoperatively for this condition.  Just prior to being taken to the operating room, the patient was put on a non-rebreather as she was saturating only 88% on 4L [place].  The patient will go to an ICU postoperatively, and will likely need the assistance of CCM.  We have d/w the patient and her husband that she may require multiple surgical operations, but we would not know definitively yet.  They understood.  We will proceed to the OR at this time.  OSBORNE,KELLY E 11/01/2011, 2:06 PM  The patient has an acute presentation of necrotizing fasciitis/cellulitis of left perineum/left labia that appears to have come up over the last few days.  Her only precipitating problem is the recent reaction to diclofenac. She also has a history of rectal stricture, which required dilatation by Dr. Cindee Lame in Oct. 2008.  She controls her bowel habits with stool softeners. I  spoke with Dr. Alexis Goodell, who is her PCP.  Since admitting her, she has gone into A.Fib.  Dr. Mendel Ryder to see. She will go to the OR today.  I discussed the operation with her husband and all the potential complications.  We discussed returning to the OR for further debridements, diverting ostomy,and the life threatening nature of this process.  Ovidio Kin, MD, [facility] Pager: [phone] Office phone:  [phone]

## 2011-11-01 NOTE — ED Notes (Signed)
Patient has arrived in cdu. She is awake and alert but her gait is very unsteady. Husband states pt normally ambulates without any assist. Pt assisted to restroom to collect ua . Sat for some time and states she cannot void. Transport here to take her and noticed when pt stood she had voided in toilet but did not know she had. Pt assisted back to stretcher

## 2011-11-01 NOTE — Preoperative (Signed)
Beta Blockers   Reason not to administer Beta Blockers:Not Applicable 

## 2011-11-01 NOTE — ED Provider Notes (Signed)
Assumed patient care at [id number]. 71 year old female with what appears to be cellulitis of the left labia fold. Pelvic CT scan ordered to rule out abscess. There appears to be no evidence of perirectal involvement on initial exam by provider. Patient does have an elevated white count with left shift. Plan to have patient admitted for further management. IV antibiotic and he in the ED.  12:52 PM K+ 2.5.  Will replenish if CT shows no acute surgical related findings.    1:21 PM Pelvic CT scan is significant for necrotizing infection. CT scan was originally read by my attending who was notified center [place] [facility] for immediate consultation. Surgical PA is available at bedside and is currently evaluating the patient. She does not appears to toxic. However she will be managed surgically today.  ECG ordered for preop and for hypoK  Fayrene Helper, PA-C 11/01/11 1339

## 2011-11-01 NOTE — Anesthesia Procedure Notes (Signed)
Procedure Name: Intubation Date/Time: 11/01/2011 4:15 PM Performed by: Domnick Chervenak S Pre-anesthesia Checklist: Patient identified, Emergency Drugs available, Suction available, Patient being monitored and Timeout performed Patient Re-evaluated:Patient Re-evaluated prior to inductionOxygen Delivery Method: Circle system utilized Preoxygenation: Pre-oxygenation with 100% oxygen Intubation Type: IV induction Ventilation: Mask ventilation without difficulty Grade View: Grade I Tube type: Oral Tube size: 8.0 mm Number of attempts: 1 Airway Equipment and Method: Stylet Secured at: 22 cm Tube secured with: Tape Dental Injury: Teeth and Oropharynx as per pre-operative assessment

## 2011-11-01 NOTE — ED Notes (Signed)
Pt sent to ER by her PCP Stoneking MD for perirectal abcess soft tissue infection. Pt Hx Afib and previous rectal infection. Pt also had recent oral reaction tongue swelling to diflucan.

## 2011-11-01 NOTE — Progress Notes (Addendum)
ANTIBIOTIC CONSULT NOTE - INITIAL  Pharmacy Consult for Zosyn Indication: Necrotizing Fasciitis  Allergies  Allergen Reactions  . Diclofenac Itching    Feels difficult to swallow, but no tongue swelling or SOB    Patient Measurements:     Vital Signs: Temp: 99.3 F (37.4 C) (04/04 1409) Temp src: Oral (04/04 1409) BP: 115/59 mmHg (04/04 1409) Pulse Rate: 112  (04/04 1409) Intake/Output from previous day:   Intake/Output from this shift:    Labs:  Basename 11/01/11 1121  WBC 19.3*  HGB 13.4  PLT 343  LABCREA --  CREATININE 0.76   CrCl is unknown because there is no height on file for the current visit. No results found for this basename: VANCOTROUGH:2,VANCOPEAK:2,VANCORANDOM:2,GENTTROUGH:2,GENTPEAK:2,GENTRANDOM:2,TOBRATROUGH:2,TOBRAPEAK:2,TOBRARND:2,AMIKACINPEAK:2,AMIKACINTROU:2,AMIKACIN:2, in the last 72 hours   Microbiology: No results found for this or any previous visit (from the past 720 hour(s)).  Medical History: Past Medical History  Diagnosis Date  . Hypertension   . Hyperlipidemia   . Cancer     breast cancer  . Anal stenosis     Medications:  Anti-infectives     Start     Dose/Rate Route Frequency Ordered Stop   11/01/11 1351  piperacillin-tazobactam (ZOSYN) 3-0.375 GM/50ML IVPB    Comments: KOHUT, NIKKI: cabinet override        11/01/11 1351 11/01/11 1400   11/01/11 1315   gentamicin (GARAMYCIN) 1.5 mg/kg in dextrose 5 % 50 mL IVPB  Status:  Discontinued        1.5 mg/kg 100 mL/hr over 30 Minutes Intravenous  Once 11/01/11 1310 11/01/11 1404   11/01/11 1130   vancomycin (VANCOCIN) IVPB 1000 mg/200 mL premix        1,000 mg 200 mL/hr over 60 Minutes Intravenous  Once 11/01/11 1127     11/01/11 1130   cefTRIAXone (ROCEPHIN) 1 g in dextrose 5 % 50 mL IVPB        1 g 100 mL/hr over 30 Minutes Intravenous  Once 11/01/11 1127 11/01/11 1205         Assessment: Necrotizing Fasciitis:  To continue broad-spectrum antimicrobial therapy with  Zosyn.  First doses of Vancomycin and Zosyn were received in the Emergency Department.  Her renal function is normal.  Goal of Therapy:  Appropriate antimicrobial therapy for indication and organ function  Plan:  Zosyn 3.375gm IV q8h extended infusion Monitor renal function and microbiologic data.  Estella Husk, Pharm.D., BCPS Clinical Pharmacist  Pager [phone] 11/01/2011, 2:16 PM   Addendum: To continue Vancomycin as well post-operatively.  Weight 95kg.  Estimated CrCl ~42ml/min. Will continue with Vancomycin 1250mg  IV q12h for a goal trough of 15-20 mcg/ml.  Estella Husk, Pharm.D., BCPS Clinical Pharmacist  Pager [phone] 11/01/2011, 6:57 PM

## 2011-11-01 NOTE — Brief Op Note (Signed)
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  PROCEDURE:   Procedure(s): IRRIGATION AND DEBRIDEMENT of left perineum/left labia, rigid sigmoidoscopy.  SURGEON:   Ovidio Kin, M.D.  ASSISTANT:   none  ANESTHESIA:   general  Judie Petit, MD - Anesthesiologist  General  EBL:  200  ml  BLOOD ADMINISTERED: none  DRAINS: Wound packed open  LOCAL MEDICATIONS USED:   none  SPECIMEN:   Debrided tissue  COUNTS CORRECT:  YES  INDICATIONS FOR PROCEDURE:  [name] is a 71 y.o. (DOB: April 24, 1941) white female whose primary care physician is Ginette Otto, MD, MD.  She came to the [facility] ER with increasing left perineal/left labia pain.  The CT scan shows extensive SQ air along left perineum/left labia.  She has a necrotizing infection and comes for I&D.   The indications and risks of the surgery were explained to the patient.  The risks include, but are not limited to, infection, bleeding, and nerve injury.  Note dictated to:   #[id number]

## 2011-11-01 NOTE — ED Provider Notes (Signed)
Medical screening examination/treatment/procedure(s) were conducted as a shared visit with non-physician practitioner(s) and myself.  I personally evaluated the patient during the encounter   Please see my other note for details  Lyanne Co, MD 11/01/11 2036

## 2011-11-01 NOTE — ED Notes (Signed)
Patient transported to X-ray 

## 2011-11-01 NOTE — ED Provider Notes (Signed)
Medical screening examination/treatment/procedure(s) were conducted as a shared visit with non-physician practitioner(s) and myself.  I personally evaluated the patient during the encounter  Necrotizing infection. [facility] called for stat evaluation at bedside. Family updated. Will add gentamicin for superior gram negative coverage. Lactate added.   Lyanne Co, MD 11/01/11 [phone]

## 2011-11-01 NOTE — ED Notes (Signed)
PA AWARE OF PT 2.5 K LEVEL

## 2011-11-01 NOTE — ED Provider Notes (Signed)
History     CSN: [id number]  Arrival date & time 11/01/11  1046   First MD Initiated Contact with Patient 11/01/11 1057      Chief Complaint  Patient presents with  . peri rectal abcess     (Consider location/radiation/quality/duration/timing/severity/associated sxs/prior treatment) HPI Comments: Patient with history of rectal surgery presents after having been seen at PCP (Dr. Pete George) office for same.  States was seen here on Tuesday with tongue swelling and allergic reaction to diclofenac, did not have the rectal pain at that time - states started later that evening, reports gradual worsening of the pain, was seen by Dr. Pete George who suspects a perirectal abscess.  Patient reports pain to the labia on the left.  Denies and drainage or discharge, reports fever but no chills, denies rectal bleeding or purulent drainage.  Patient is a 71 y.o. female presenting with female genitourinary complaint. The history is provided by the patient and the spouse. No language interpreter was used.  Female GU Problem Primary symptoms include genital pain and genital rash.  Primary symptoms include no discharge, no pelvic pain, no dyspareunia, no genital lesions, no genital itching, no genital odor, no dysuria and no vaginal bleeding. The maximum temperature recorded prior to her arrival was 100 to 100.9 F. The fever has been present for 1 to 2 days. This is a new problem. The current episode started 2 days ago. The problem occurs constantly. The problem has been gradually worsening. The symptoms occur at rest. She is not pregnant. She has not missed her period. Vaginal discharge characteristics: none. Pertinent negatives include no anorexia, no diaphoresis, no abdominal swelling, no abdominal pain, no constipation, no diarrhea, no nausea, no vomiting, no frequency, no light-headedness and no dizziness. She has tried nothing for the symptoms. The treatment provided no relief. Sexual activity: non-contributory.  There is no concern regarding sexually transmitted diseases. Associated medical issues include perineal abscess.    Past Medical History  Diagnosis Date  . Hypertension   . Cancer     Past Surgical History  Procedure Date  . Breast surgery     No family history on file.  History  Substance Use Topics  . Smoking status: Never Smoker   . Smokeless tobacco: Not on file  . Alcohol Use: No    OB History    Grav Para Term Preterm Abortions TAB SAB Ect Mult Living                  Review of Systems  Constitutional: Negative for diaphoresis.  Gastrointestinal: Positive for rectal pain. Negative for nausea, vomiting, abdominal pain, diarrhea, constipation, anal bleeding and anorexia.  Genitourinary: Positive for genital sores and vaginal pain. Negative for dysuria, frequency, vaginal bleeding, vaginal discharge, pelvic pain and dyspareunia.  Neurological: Negative for dizziness and light-headedness.  All other systems reviewed and are negative.    Allergies  Diclofenac  Home Medications   Current Outpatient Rx  Name Route Sig Dispense Refill  . AMLODIPINE BESYLATE 5 MG PO TABS Oral Take 5 mg by mouth daily.    . ASPIRIN EC 81 MG PO TBEC Oral Take 81 mg by mouth daily.    . ATORVASTATIN CALCIUM 40 MG PO TABS Oral Take 40 mg by mouth daily.    Marland Kitchen CALCIUM CARBONATE ANTACID 500 MG PO CHEW Oral Chew 1 tablet by mouth daily.    Marland Kitchen DIPHENHYDRAMINE HCL 25 MG PO TABS Oral Take 1 tablet (25 mg total) by mouth every 6 (six) hours.  20 tablet 0  . FAMOTIDINE 20 MG PO TABS Oral Take 1 tablet (20 mg total) by mouth 2 (two) times daily. 14 tablet 0  . HYDROCHLOROTHIAZIDE 25 MG PO TABS Oral Take 25 mg by mouth daily.    . MELOXICAM 15 MG PO TABS Oral Take 15 mg by mouth daily.    . ADULT MULTIVITAMIN W/MINERALS CH Oral Take 1 tablet by mouth daily.    Marland Kitchen PAROXETINE HCL 40 MG PO TABS Oral Take 40 mg by mouth every morning.    Marland Kitchen PREDNISONE 50 MG PO TABS Oral Take 1 tablet (50 mg total) by  mouth daily. 5 tablet 0  . SOLIFENACIN SUCCINATE 10 MG PO TABS Oral Take 10 mg by mouth daily.       BP 132/63  Pulse 103  Temp(Src) 99.3 F (37.4 C) (Oral)  Resp 18  SpO2 92%  Physical Exam  Nursing note and vitals reviewed. Constitutional: She is oriented to person, place, and time. She appears well-developed and well-nourished. No distress.  HENT:  Head: Normocephalic and atraumatic.  Right Ear: External ear normal.  Left Ear: External ear normal.  Nose: Nose normal.  Mouth/Throat: Oropharynx is clear and moist. No oropharyngeal exudate.  Eyes: Conjunctivae are normal. Pupils are equal, round, and reactive to light. No scleral icterus.  Neck: Normal range of motion. Neck supple. No thyromegaly present.  Cardiovascular: Normal rate, regular rhythm and normal heart sounds.  Exam reveals no gallop and no friction rub.   No murmur heard. Pulmonary/Chest: Effort normal and breath sounds normal. No respiratory distress. She has no wheezes. She has no rales. She exhibits no tenderness.  Abdominal: Soft. Bowel sounds are normal. She exhibits no distension and no mass. There is tenderness. There is no rebound and no guarding.    Genitourinary:    There is no rash or tenderness on the right labia. There is rash and tenderness on the left labia. There is no lesion on the left labia. There is erythema and tenderness around the vagina. No bleeding around the vagina. No vaginal discharge found.  Musculoskeletal: Normal range of motion. She exhibits no edema and no tenderness.  Lymphadenopathy:    She has no cervical adenopathy.  Neurological: She is alert and oriented to person, place, and time. No cranial nerve deficit.  Skin: Skin is warm and dry. There is erythema. No pallor.  Psychiatric: She has a normal mood and affect. Her behavior is normal. Judgment and thought content normal.    ED Course  Procedures (including critical care time)   Labs Reviewed  CBC  DIFFERENTIAL    URINALYSIS, ROUTINE W REFLEX MICROSCOPIC   No results found.   No diagnosis found.    MDM  Patient to be transferred to CDU for CT scan and IV antibiotics will continue to be followed by Dr. Patria Mane and B. Ardelle Park        [name] [place], [place] 11/01/11 1251

## 2011-11-01 NOTE — Transfer of Care (Signed)
Immediate Anesthesia Transfer of Care Note  Patient: [name]  Procedure(s) Performed: Procedure(s) (LRB): IRRIGATION AND DEBRIDEMENT PERIRECTAL ABSCESS (N/A)  Patient Location: PACU  Anesthesia Type: General  Level of Consciousness: awake, alert  and oriented  Airway & Oxygen Therapy: Patient Spontanous Breathing and Patient connected to nasal cannula oxygen  Post-op Assessment: Report given to PACU RN and Post -op Vital signs reviewed and stable  Post vital signs: Reviewed and stable  Complications: No apparent anesthesia complications

## 2011-11-01 NOTE — Consult Note (Signed)
Admit date: 11/01/2011 Referring Physician  Dr. Dorris Singh  Primary Physician  Dr. Merlene Laughter Primary Cardiologist  Dr. Verdis Prime Reason for Consultation  Atrial fibrillation new onset  HPI: This is a very pleasant 71 yo female with a history of HTN and breast cancer. About 10 days ago she was placed on Diclofenac secondary to knee arthritis. She had an allergic reaction to this with difficult swallowing and breathing. She came to the [facility] on Tuesday for evaluation and she was given predisone and other medications for her allergic reaction. The husband provides much of the history as the patient is very sedated and unable to provide any significant history. He is unsure exactly when this problem started. She said something to him about some pain in her perineum on Tuesday, but made no other comments. He thinks she said that she initially started having some pain 3-4 days ago. She saw her PCP this morning and was immediately sent over to [facility] for evaluation. She had a CT scan which revealed diffuse air in her mons pubis area, left labia tracking posterior towards her rectum and down her left thigh. We were immediately called to see the patient for admission.  She denies any fevers or chills. Some occasional SOB secondary to initial allergic reaction but no DOE prior to the reaction. No chest pain. She denies any palpitations, dizziness, exertional fatigue or syncope.  She has never had atrial fibrillation today but in preop was noted to have new onset atrial fibrillation with fairly good rate control of unknown duration.       PMH:   Past Medical History  Diagnosis Date  . Hypertension   . Hyperlipidemia   . Cancer     breast cancer  . Anal stenosis      PSH:   Past Surgical History  Procedure Date  . Breast surgery     right mastectomy; left lumpectomy  . Anal dilation     multiple  . Abdominal hysterectomy   . Joint replacement     right knee    Allergies:  Diclofenac Prior to  Admit Meds:   (Not in a hospital admission) Fam HX:   History reviewed. No pertinent family history. Social HX:    History   Social History  . Marital Status: Married    Spouse Name: N/A    Number of Children: N/A  . Years of Education: N/A   Occupational History  . Not on file.   Social History Main Topics  . Smoking status: Never Smoker   . Smokeless tobacco: Not on file  . Alcohol Use: Yes     ocassional glass of wine  . Drug Use:   . Sexually Active:    Other Topics Concern  . Not on file   Social History Narrative  . No narrative on file     ROS:  All 11 ROS were addressed and are negative except what is stated in the HPI  Physical Exam: Blood pressure 115/59, pulse 112, temperature 99.3 F (37.4 C), temperature source Oral, resp. rate 19, SpO2 91.00%.    General: Well developed, well nourished, in no acute distress Head: Eyes PERRLA, No xanthomas.   Normal cephalic and atramatic  Lungs:   Clear bilaterally to auscultation and percussion. Heart:   Irregularly irregular S1 S2 Pulses are 2+ & equal.            No carotid bruit. No JVD.  No abdominal bruits. No femoral bruits. Abdomen: Bowel sounds  are positive, abdomen soft and non-tender without masses  Extremities:   No clubbing, cyanosis or edema.  DP +1 Neuro: Alert and oriented X 3. Somewhat confused due to pain meds     Labs:   Lab Results  Component Value Date   WBC 19.3* 11/01/2011   HGB 13.4 11/01/2011   HCT 38.2 11/01/2011   MCV 86.0 11/01/2011   PLT 343 11/01/2011    Lab 11/01/11 1121  NA 131*  K 2.5*  CL 87*  CO2 29  BUN 16  CREATININE 0.76  CALCIUM 9.1  PROT --  BILITOT --  ALKPHOS --  ALT --  AST --  GLUCOSE 143*   No results found for this basename: PTT   Lab Results  Component Value Date   INR 0.94 10/10/2009       Radiology:  Dg Chest 1 View  11/01/2011  *RADIOLOGY REPORT*  Clinical Data: Shortness of breath.  CHEST - 1 VIEW  Comparison: 04/10/2011  Findings: There is  peribronchial thickening.  Low lung volumes. Heart size is accentuated by the low volumes.  No effusions or acute bony abnormality.  IMPRESSION: Low lung volumes, peribronchial thickening.  Original Report Authenticated By: Cyndie Chime, M.D.   Ct Pelvis W Contrast  11/01/2011  *RADIOLOGY REPORT*  Clinical Data:  Suspected left labial/perirectal abscess  CT PELVIS WITH CONTRAST  Technique:  Multidetector CT imaging of the pelvis was performed using the standard protocol following the bolus administration of intravenous contrast.  Contrast:   100 ml Omnipaque-300 IV  Comparison:  None.  Findings:  No drainable fluid collection or abscess.  Extensive soft tissue gas predominately involving the left labial/peritoneal region (series 2/image 51), highly suspicious for necrotizing fasciitis/Fournier's gangrene. Gas extends into the left lower abdominopelvic wall (series 2/image 36), left medial thigh (series 2/image 61), bilateral ischiorectal fossa (series 2/image 41), and extending into the right lower pelvis (series 2/image 30) via the obturator internus.  Of note, the gas in the left medial thigh is incompletely visualized in its inferior extent.  Layering contrast in the bladder.  Status post hysterectomy.  No adnexal masses.  Colonic diverticulosis, without associate inflammatory changes.  No pelvic ascites.  No suspicious pelvic lymphadenopathy.  Degenerative changes at L4-5.  IMPRESSION: Suspected necrotizing fasciitis/Fournier's gangrene, predominantly involving the left perineal region as described above, but also involving the left medial thigh (incompletely imaged) and extending from the right ischiorectal fascia into the right lower pelvis. Emergent surgical and ID consultation is suggested.  Critical Value/emergent results were called by telephone at the time of interpretation on 11/01/2011  at 1254 hours  to  Dr. Azalia Bilis, who verbally acknowledged these results.  Original Report Authenticated By:  Charline Bills, M.D.    EKG: Atrial fibrillation with RVR with HR 115bpm, no ST changes  ASSESSMENT:  1.  New onset atrial fibrillation of unknown duration in the setting of acute severe illness - heart rate ranging from 85-105bpm 2.  Necrotizing fasciitis of the perineum 3.  Sepsis 4.  HTN 5.  H/O breast cancer 6.  History of rectal stricture 7.  Recent reaction to dicofenac  PLAN:   1.  Patient is stable from a cardiac standpoint for surgery 2.  If heart rate becomes increased during the operative/periop period can use IV Lopressor for rate control as BP tolerates 3.  Check 2D echo post-op to assess LVF 4.  Will follow along post-op 5.  Replete potassium  Quintella Reichert, MD  11/01/2011  3:42  PM

## 2011-11-02 ENCOUNTER — Encounter (HOSPITAL_COMMUNITY): Payer: Self-pay | Admitting: Surgery

## 2011-11-02 LAB — CBC
HCT: 29.2 % — ABNORMAL LOW (ref 36.0–46.0)
Hemoglobin: 10 g/dL — ABNORMAL LOW (ref 12.0–15.0)
RBC: 3.32 MIL/uL — ABNORMAL LOW (ref 3.87–5.11)
WBC: 12.3 10*3/uL — ABNORMAL HIGH (ref 4.0–10.5)

## 2011-11-02 LAB — COMPREHENSIVE METABOLIC PANEL
BUN: 12 mg/dL (ref 6–23)
Calcium: 8 mg/dL — ABNORMAL LOW (ref 8.4–10.5)
Creatinine, Ser: 0.68 mg/dL (ref 0.50–1.10)
GFR calc Af Amer: >90 mL/min (ref >90)
GFR calc non Af Amer: 85 mL/min — ABNORMAL LOW (ref >90)
Glucose, Bld: 120 mg/dL — ABNORMAL HIGH (ref 70–99)
Sodium: 132 mEq/L — ABNORMAL LOW (ref 135–145)
Total Protein: 5.5 g/dL — ABNORMAL LOW (ref 6.0–8.3)

## 2011-11-02 LAB — DIFFERENTIAL
Lymphs Abs: 1.3 10*3/uL (ref 0.7–4.0)
Monocytes Relative: 4 % (ref 3–12)
Neutro Abs: 10.4 10*3/uL — ABNORMAL HIGH (ref 1.7–7.7)
Neutrophils Relative %: 85 % — ABNORMAL HIGH (ref 43–77)

## 2011-11-02 MED ORDER — METOPROLOL TARTRATE 25 MG PO TABS
25.0000 mg | ORAL_TABLET | Freq: Two times a day (BID) | ORAL | Status: DC
  Administered 2011-11-02 – 2011-11-05 (×8): 25 mg via ORAL
  Filled 2011-11-02 (×10): qty 1

## 2011-11-02 MED ORDER — POTASSIUM CHLORIDE 20 MEQ/15ML (10%) PO LIQD
40.0000 meq | Freq: Once | ORAL | Status: AC
  Administered 2011-11-02: 40 meq
  Filled 2011-11-02: qty 30

## 2011-11-02 NOTE — Progress Notes (Signed)
General Surgery Note  LOS: 1 day  POD# 1 Room - 2301 PCP - Dr. Alexis Goodell  Assessment/Plan:  1. Necrotizing fasciitis -  IRRIGATION AND DEBRIDEMENT Left perineum/left labia - D. Aarin Sparkman - 11/01/2011  Wound look much better today.  Will not return to the OR today.  Will start dressing changes Q 8 hours.  Mentally much better today.  I spoke to the husband on the phone.  2.  Sepsis  - overall looks better.  On Vanc/Zosyn  3.  Atrial fibrillation   Seen by Dr. Kym Groom 4.  Hypertension. 5.  History of breast cancer.  Subjective:  Feels better.  Speech pattern better. Objective:   Filed Vitals:   11/02/11 0725  BP:   Pulse:   Temp: 98.9 F (37.2 C)  Resp:      Intake/Output from previous day:  04/04 0701 - 04/05 0700 In: 3575 [P.O.:150; I.V.:3100; IV Piggyback:325] Out: 1345 [Urine:1345]  Intake/Output this shift:      Physical Exam:   General: WN older WF who is alert and oriented.    HEENT: Normal. Pupils equal. .   Lungs: Clear   Abdomen: Soft.   Wound: Took the bandage down.  Wound much cleaner.  Will do local dressing changes for now.   Neurologic:  Grossly intact to motor and sensory function.   Psychiatric: Has normal mood and affect. Behavior is normal   Lab Results:    Basename 11/02/11 0359 11/01/11 1731  WBC 12.3* 21.9*  HGB 10.0* 11.2*  HCT 29.2* 32.9*  PLT 277 334    BMET   Basename 11/02/11 0359 11/01/11 1930  NA 132* 132*  K 3.2* 3.3*  CL 94* 94*  CO2 30 29  GLUCOSE 120* 175*  BUN 12 12  CREATININE 0.68 0.64  CALCIUM 8.0* 8.0*    PT/INR  No results found for this basename: LABPROT:2,INR:2 in the last 72 hours  ABG   Basename 11/01/11 1557  PHART 7.452*  HCO3 29.4*     Studies/Results:  Dg Chest 1 View  11/01/2011  *RADIOLOGY REPORT*  Clinical Data: Shortness of breath.  CHEST - 1 VIEW  Comparison: 04/10/2011  Findings: There is peribronchial thickening.  Low lung volumes. Heart size is accentuated by the low volumes.  No  effusions or acute bony abnormality.  IMPRESSION: Low lung volumes, peribronchial thickening.  Original Report Authenticated By: Cyndie Chime, M.D.   Ct Pelvis W Contrast  11/01/2011  *RADIOLOGY REPORT*  Clinical Data:  Suspected left labial/perirectal abscess  CT PELVIS WITH CONTRAST  Technique:  Multidetector CT imaging of the pelvis was performed using the standard protocol following the bolus administration of intravenous contrast.  Contrast:   100 ml Omnipaque-300 IV  Comparison:  None.  Findings:  No drainable fluid collection or abscess.  Extensive soft tissue gas predominately involving the left labial/peritoneal region (series 2/image 51), highly suspicious for necrotizing fasciitis/Fournier's gangrene. Gas extends into the left lower abdominopelvic wall (series 2/image 36), left medial thigh (series 2/image 61), bilateral ischiorectal fossa (series 2/image 41), and extending into the right lower pelvis (series 2/image 30) via the obturator internus.  Of note, the gas in the left medial thigh is incompletely visualized in its inferior extent.  Layering contrast in the bladder.  Status post hysterectomy.  No adnexal masses.  Colonic diverticulosis, without associate inflammatory changes.  No pelvic ascites.  No suspicious pelvic lymphadenopathy.  Degenerative changes at L4-5.  IMPRESSION: Suspected necrotizing fasciitis/Fournier's gangrene, predominantly involving the left perineal  region as described above, but also involving the left medial thigh (incompletely imaged) and extending from the right ischiorectal fascia into the right lower pelvis. Emergent surgical and ID consultation is suggested.  Critical Value/emergent results were called by telephone at the time of interpretation on 11/01/2011  at 1254 hours  to  Dr. Azalia Bilis, who verbally acknowledged these results.  Original Report Authenticated By: Charline Bills, M.D.     Anti-infectives:   Anti-infectives     Start     Dose/Rate  Route Frequency Ordered Stop   11/02/11 0000   vancomycin (VANCOCIN) 1,250 mg in sodium chloride 0.9 % 250 mL IVPB        1,250 mg 166.7 mL/hr over 90 Minutes Intravenous Every 12 hours 11/01/11 1858     11/01/11 2200  piperacillin-tazobactam (ZOSYN) IVPB 3.375 g       3.375 g 12.5 mL/hr over 240 Minutes Intravenous 3 times per day 11/01/11 1843     11/01/11 1351  piperacillin-tazobactam (ZOSYN) 3-0.375 GM/50ML IVPB    Comments: KOHUT, NIKKI: cabinet override        11/01/11 1351 11/01/11 1400   11/01/11 1315   gentamicin (GARAMYCIN) 1.5 mg/kg in dextrose 5 % 50 mL IVPB  Status:  Discontinued        1.5 mg/kg 100 mL/hr over 30 Minutes Intravenous  Once 11/01/11 1310 11/01/11 1404   11/01/11 1130   vancomycin (VANCOCIN) IVPB 1000 mg/200 mL premix        1,000 mg 200 mL/hr over 60 Minutes Intravenous  Once 11/01/11 1127 11/01/11 1441   11/01/11 1130   cefTRIAXone (ROCEPHIN) 1 g in dextrose 5 % 50 mL IVPB        1 g 100 mL/hr over 30 Minutes Intravenous  Once 11/01/11 1127 11/01/11 1205          Ovidio Kin, MD, FACS Pager: [phone],   [facility] Office: [phone] 11/02/2011

## 2011-11-02 NOTE — Progress Notes (Signed)
eLink Physician-Brief Progress Note Patient Name: [name] DOB: 08-Nov-1940 MRN: [id number]  Date of Service  11/02/2011   HPI/Events of Note    Lab 11/02/11 0359 11/01/11 1930 11/01/11 1731 11/01/11 1557 11/01/11 1121  K 3.2* 3.3* 3.6 2.9* 2.5*    Lab 11/02/11 0359 11/01/11 1930 11/01/11 1731 11/01/11 1121  CREATININE 0.68 0.64 0.66 0.76      eICU Interventions  kcl x 1 via tube      Colisha Redler 11/02/2011, 5:03 AM

## 2011-11-02 NOTE — Progress Notes (Signed)
Utilization review completed. Caitlynn Ju Diane4/11/2011  

## 2011-11-02 NOTE — Op Note (Signed)
NAMEDELANO, SCARDINO             ACCOUNT NO.:  [id number]  MEDICAL RECORD NO.:  [id number]  LOCATION:  2301                         FACILITY:  [facility]  PHYSICIAN:  Sandria Bales. Ezzard Standing, M.D.  DATE OF BIRTH:  [birth year]  DATE OF PROCEDURE:  11/01/2011                              OPERATIVE REPORT  PREOPERATIVE DIAGNOSIS:  Left labial and left perineal necrotizing fasciitis.  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  PROCEDURE:  Rigid sigmoidoscopy and incision and drainage of left perineum and left labia (20 cm incision).  SURGEON:  Sandria Bales. Ezzard Standing, MD  ASSISTANT:  No first assistant.  ANESTHESIA:  General endotracheal, supervised by Judie Petit, M.D.  ESTIMATED BLOOD LOSS:  200 mL.  DRAINS:  Drains left in.  I packed the wound with a 4-inch Kerlix.  INDICATION FOR PROCEDURE:  Ms. Burry is a 71 year old white female, who sees Dr. Merlene Laughter as her primary medical doctor.  She has a history of anal strictures.  She recently had allergic reaction to diclofenac and then developed over the last 3 or 4 days ago some perineal, peri-labial pain.  She saw Dr. Pete Glatter earlier today.  He sent her to the [facility] Room with a worsening localized cellulitis of her left perineum and left labia.  A CT scan showed extensive air in the perineum, peri-rectal, along the left labia, and in the suprapubic area suggesting a necrotizing fasciitis.  Unfortunately, immediately before going to the OR, she went into atrial Fibrillation and has been seen by Dr. Armanda Magic in consultation.  The indications and potential complications were explained to the patient and her husband.  Potential risks include bleeding, infection, nerve injuries, and then just tried to control this infection which may require repeat operative visits.  OPERATIVE NOTE:  The patient in room 17 in the OR, underwent a general anesthesia supervised by Dr.  Judie Petit.  She was placed in lithotomy.    A time out was held and the surgical checklist reviewed.  She was already on antibiotics of Zosyn and vanc.  I did a rigid sigmoidoscopy to 20 cm at the beginning of the procedure.  She was not prepped, but I could see the  mucosa of her rectum well.  I saw no rectal or anal lesion, no abscess, or infection and, even though she has had a history of anal stricture, I can easily get 2 fingerbreadths inside the rectum.  Along her left labia and left perineum, she has a brawny cellulitis.  I made a linear incision approximately 20 cm in length which extended from the top of the left labia down to the left perineum beside the anus.  The soft tissues of this area, which were hyperemic, were interlaced with a foul-smelling purulent fluid and some tissue destruction consisitent with a Fournier gangrene/necrotizing fasciitis.  I debrided the tissue that looked non-viable and I tried to break into any abscess tracks.  I irrigated the wound with saline and peroxide.  I then packed the wound with a 4 inch Curlex soaked in Betadine.  A decision to return to the OR will be made on a day-to-day basis depending on how she is  doing.  The patient was extubated at the end of the case and transferred to the recovery room.  Sponge and needle count were correct at the end of the case.   Sandria Bales. Ezzard Standing, M.D., FACS   DHN/MEDQ  D:  11/01/2011  T:  11/02/2011  Job:  [id number]  cc:   Hal T. Stoneking, M.D. Armanda Magic, M.D.

## 2011-11-02 NOTE — Progress Notes (Signed)
Patient Name: [name] Date of Encounter: 11/02/2011    SUBJECTIVE:SDhe denies CV complaints. We did an ischemic eval in remote past and no CAD by cath was noted(2004). Nuclear study from 2008 was unchanged from 2004.  TELEMETRY:  Atrial fib with mild tachycardia: Filed Vitals:   11/02/11 1000 11/02/11 1100 11/02/11 1145 11/02/11 1200  BP:  117/79  123/95  Pulse: 97 106  101  Temp:   99.5 F (37.5 C)   TempSrc:   Oral   Resp: 20 26  22   Weight:      SpO2: 96% 92%  94%    Intake/Output Summary (Last 24 hours) at 11/02/11 1223 Last data filed at 11/02/11 1200  Gross per 24 hour  Intake   4020 ml  Output   1595 ml  Net   2425 ml    LABS: Basic Metabolic Panel:  Basename 11/02/11 0359 11/01/11 1930  NA 132* 132*  K 3.2* 3.3*  CL 94* 94*  CO2 30 29  GLUCOSE 120* 175*  BUN 12 12  CREATININE 0.68 0.64  CALCIUM 8.0* 8.0*  MG -- --  PHOS -- --   CBC:  Basename 11/02/11 0359 11/01/11 1731  WBC 12.3* 21.9*  NEUTROABS 10.4* 19.3*  HGB 10.0* 11.2*  HCT 29.2* 32.9*  MCV 88.0 86.8  PLT 277 334    Radiology/Studies:  No pertinent cardiac data  Physical Exam: Blood pressure 123/95, pulse 101, temperature 99.5 F (37.5 C), temperature source Oral, resp. rate 22, weight 93.2 kg (205 lb 7.5 oz), SpO2 94.00%. Weight change:    Irregularly irregular rhythm. No murmur, gallop, or rub.  ASSESSMENT:  1. Atrial fibrillation of unknown duration with moderate rate control without therapy.  2. Necrotizing fasciitis, perineal  3. Hypertension    Plan:  1. Metoprolol 25 mg by mouth twice a day  2. She has a CHADS score of 2 and will need antithrombotic therapy when safe to start.  3. Echocardiogram is pending.  Selinda Eon 11/02/2011, 12:23 PM

## 2011-11-03 DIAGNOSIS — E876 Hypokalemia: Secondary | ICD-10-CM

## 2011-11-03 LAB — BASIC METABOLIC PANEL
BUN: 10 mg/dL (ref 6–23)
GFR calc non Af Amer: 85 mL/min — ABNORMAL LOW (ref >90)
Glucose, Bld: 128 mg/dL — ABNORMAL HIGH (ref 70–99)
Potassium: 2.9 mEq/L — ABNORMAL LOW (ref 3.5–5.1)

## 2011-11-03 LAB — CULTURE, ROUTINE-ABSCESS

## 2011-11-03 MED ORDER — SENNOSIDES-DOCUSATE SODIUM 8.6-50 MG PO TABS
1.0000 | ORAL_TABLET | Freq: Two times a day (BID) | ORAL | Status: DC
  Administered 2011-11-03 – 2011-11-07 (×7): 1 via ORAL
  Filled 2011-11-03 (×11): qty 1

## 2011-11-03 MED ORDER — BISACODYL 5 MG PO TBEC
10.0000 mg | DELAYED_RELEASE_TABLET | Freq: Every day | ORAL | Status: DC
  Administered 2011-11-03: 5 mg via ORAL
  Administered 2011-11-04 – 2011-11-05 (×2): 10 mg via ORAL
  Filled 2011-11-03 (×10): qty 2

## 2011-11-03 MED ORDER — POTASSIUM CHLORIDE CRYS ER 20 MEQ PO TBCR
60.0000 meq | EXTENDED_RELEASE_TABLET | Freq: Two times a day (BID) | ORAL | Status: DC
  Administered 2011-11-03 – 2011-11-04 (×3): 60 meq via ORAL
  Filled 2011-11-03 (×4): qty 3
  Filled 2011-11-03: qty 2

## 2011-11-03 NOTE — Progress Notes (Signed)
SUBJECTIVE:  No complaints at present  OBJECTIVE:   Vitals:   Filed Vitals:   11/03/11 0500 11/03/11 0600 11/03/11 0700 11/03/11 0704  BP: 92/65 133/54 108/84   Pulse: 82 85 81   Temp:    97.9 F (36.6 C)  TempSrc:    Oral  Resp: 28 23 25    Weight:      SpO2: 91% 97% 96%    I&O's:   Intake/Output Summary (Last 24 hours) at 11/03/11 1005 Last data filed at 11/03/11 0700  Gross per 24 hour  Intake   2145 ml  Output   1125 ml  Net   1020 ml   TELEMETRY: Reviewed telemetry pt in atrial fibrillation     PHYSICAL EXAM General: Well developed, well nourished, in no acute distress Head: Eyes PERRLA, No xanthomas.   Normal cephalic and atramatic  Lungs:   Clear bilaterally to auscultation and percussion. Heart:   HRRR S1 S2 Pulses are 2+ & equal. Abdomen: Bowel sounds are positive, abdomen soft and non-tender without masses  Extremities:   No clubbing, cyanosis or edema.  DP +1 Neuro: Alert and oriented X 3. Psych:  Good affect, responds appropriately   LABS: Basic Metabolic Panel:  Basename 11/02/11 0359 11/01/11 1930  NA 132* 132*  K 3.2* 3.3*  CL 94* 94*  CO2 30 29  GLUCOSE 120* 175*  BUN 12 12  CREATININE 0.68 0.64  CALCIUM 8.0* 8.0*  MG -- --  PHOS -- --   Liver Function Tests:  [facility] 11/02/11 0359  AST 135*  ALT 274*  ALKPHOS 124*  BILITOT 0.4  PROT 5.5*  ALBUMIN 2.0*    CBC:  Basename 11/02/11 0359 11/01/11 1731  WBC 12.3* 21.9*  NEUTROABS 10.4* 19.3*  HGB 10.0* 11.2*  HCT 29.2* 32.9*  MCV 88.0 86.8  PLT 277 334   Coag Panel:   Lab Results  Component Value Date   INR 0.94 10/10/2009    RADIOLOGY: Dg Chest 1 View  11/01/2011  *RADIOLOGY REPORT*  Clinical Data: Shortness of breath.  CHEST - 1 VIEW  Comparison: 04/10/2011  Findings: There is peribronchial thickening.  Low lung volumes. Heart size is accentuated by the low volumes.  No effusions or acute bony abnormality.  IMPRESSION: Low lung volumes, peribronchial thickening.  Original  Report Authenticated By: Cyndie Chime, M.D.   Ct Pelvis W Contrast  11/01/2011  *RADIOLOGY REPORT*  Clinical Data:  Suspected left labial/perirectal abscess  CT PELVIS WITH CONTRAST  Technique:  Multidetector CT imaging of the pelvis was performed using the standard protocol following the bolus administration of intravenous contrast.  Contrast:   100 ml Omnipaque-300 IV  Comparison:  None.  Findings:  No drainable fluid collection or abscess.  Extensive soft tissue gas predominately involving the left labial/peritoneal region (series 2/image 51), highly suspicious for necrotizing fasciitis/Fournier's gangrene. Gas extends into the left lower abdominopelvic wall (series 2/image 36), left medial thigh (series 2/image 61), bilateral ischiorectal fossa (series 2/image 41), and extending into the right lower pelvis (series 2/image 30) via the obturator internus.  Of note, the gas in the left medial thigh is incompletely visualized in its inferior extent.  Layering contrast in the bladder.  Status post hysterectomy.  No adnexal masses.  Colonic diverticulosis, without associate inflammatory changes.  No pelvic ascites.  No suspicious pelvic lymphadenopathy.  Degenerative changes at L4-5.  IMPRESSION: Suspected necrotizing fasciitis/Fournier's gangrene, predominantly involving the left perineal region as described above, but also involving the left medial thigh (  incompletely imaged) and extending from the right ischiorectal fascia into the right lower pelvis. Emergent surgical and ID consultation is suggested.  Critical Value/emergent results were called by telephone at the time of interpretation on 11/01/2011  at 1254 hours  to  Dr. Azalia Bilis, who verbally acknowledged these results.  Original Report Authenticated By: Charline Bills, M.D.      ASSESSMENT:  1. Atrial fibrillation of unknown duration with good rate control on beta blockers.  2. Necrotizing fasciitis, perineal  3. Hypertension 4.  Hypokalemia   PLAN:   1.  Recheck BMET 2.  Continue metoprolol for rate control 3.  Start anticoagulation with warfarin when surgery feels it is safe to start   Quintella Reichert, MD  11/03/2011  10:05 AM

## 2011-11-03 NOTE — Progress Notes (Signed)
Patient ID: [name], female   DOB: [birth year], 71 y.o.   MRN: [id number] General Surgery Note  LOS: 2 days  POD# 2 Room - 2301 PCP - Dr. Alexis Goodell  Assessment/Plan:  1. Necrotizing fasciitis -  IRRIGATION AND DEBRIDEMENT Left perineum/left labia - D. Newman - 11/01/2011  Wound looks much better today.    Continue TID dressing changes.  Reassess tomorrow.  Think probably will not need more operative debridement  2.  Sepsis  - overall looks better.  On Vanc/Zosyn  3.  Atrial fibrillation   Seen by Dr. Kym Groom  4.  Hypertension- antihypertensives (metoprolol)  4.5 - Hypokalemia - replete, recheck in AM  5.  History of breast cancer.  Subjective:  Pain 2/10.  Was argumentative about labs initially because she didn't understand why they were ordered.  Discussed and she is amenable.    Objective:   Filed Vitals:   11/03/11 0704  BP:   Pulse:   Temp: 97.9 F (36.6 C)  Resp:      Intake/Output from previous day:  04/05 0701 - 04/06 0700 In: 2390 [P.O.:540; I.V.:1200; IV Piggyback:650] Out: 1275 [Urine:1275]  Intake/Output this shift:      Physical Exam:   General: WN older WF who is alert and oriented.    HEENT: Normal. Pupils equal. .   Lungs: Clear   Abdomen: Soft.   Wound: Took the bandage down.  Wound clean, repacked.   Neurologic:  Grossly intact to motor and sensory function.   Psychiatric: Has normal mood and affect. Behavior is normal   Lab Results:     Basename 11/02/11 0359 11/01/11 1731  WBC 12.3* 21.9*  HGB 10.0* 11.2*  HCT 29.2* 32.9*  PLT 277 334    BMET    Basename 11/02/11 0359 11/01/11 1930  NA 132* 132*  K 3.2* 3.3*  CL 94* 94*  CO2 30 29  GLUCOSE 120* 175*  BUN 12 12  CREATININE 0.68 0.64  CALCIUM 8.0* 8.0*    PT/INR  No results found for this basename: LABPROT:2,INR:2 in the last 72 hours  ABG    Basename 11/01/11 1557  PHART 7.452*  HCO3 29.4*     Studies/Results:  Dg Chest 1 View  11/01/2011   *RADIOLOGY REPORT*  Clinical Data: Shortness of breath.  CHEST - 1 VIEW  Comparison: 04/10/2011  Findings: There is peribronchial thickening.  Low lung volumes. Heart size is accentuated by the low volumes.  No effusions or acute bony abnormality.  IMPRESSION: Low lung volumes, peribronchial thickening.  Original Report Authenticated By: Cyndie Chime, M.D.   Ct Pelvis W Contrast  11/01/2011  *RADIOLOGY REPORT*  Clinical Data:  Suspected left labial/perirectal abscess  CT PELVIS WITH CONTRAST  Technique:  Multidetector CT imaging of the pelvis was performed using the standard protocol following the bolus administration of intravenous contrast.  Contrast:   100 ml Omnipaque-300 IV  Comparison:  None.  Findings:  No drainable fluid collection or abscess.  Extensive soft tissue gas predominately involving the left labial/peritoneal region (series 2/image 51), highly suspicious for necrotizing fasciitis/Fournier's gangrene. Gas extends into the left lower abdominopelvic wall (series 2/image 36), left medial thigh (series 2/image 61), bilateral ischiorectal fossa (series 2/image 41), and extending into the right lower pelvis (series 2/image 30) via the obturator internus.  Of note, the gas in the left medial thigh is incompletely visualized in its inferior extent.  Layering contrast in the bladder.  Status post hysterectomy.  No adnexal masses.  Colonic diverticulosis, without associate inflammatory changes.  No pelvic ascites.  No suspicious pelvic lymphadenopathy.  Degenerative changes at L4-5.  IMPRESSION: Suspected necrotizing fasciitis/Fournier's gangrene, predominantly involving the left perineal region as described above, but also involving the left medial thigh (incompletely imaged) and extending from the right ischiorectal fascia into the right lower pelvis. Emergent surgical and ID consultation is suggested.  Critical Value/emergent results were called by telephone at the time of interpretation on 11/01/2011   at 1254 hours  to  Dr. Azalia Bilis, who verbally acknowledged these results.  Original Report Authenticated By: Charline Bills, M.D.     Anti-infectives:   Anti-infectives     Start     Dose/Rate Route Frequency Ordered Stop   11/02/11 0000   vancomycin (VANCOCIN) 1,250 mg in sodium chloride 0.9 % 250 mL IVPB        1,250 mg 166.7 mL/hr over 90 Minutes Intravenous Every 12 hours 11/01/11 1858     11/01/11 2200   piperacillin-tazobactam (ZOSYN) IVPB 3.375 g        3.375 g 12.5 mL/hr over 240 Minutes Intravenous 3 times per day 11/01/11 1843     11/01/11 1351   piperacillin-tazobactam (ZOSYN) 3-0.375 GM/50ML IVPB     Comments: KOHUT, NIKKI: cabinet override         11/01/11 1351 11/01/11 1400   11/01/11 1315   gentamicin (GARAMYCIN) 1.5 mg/kg in dextrose 5 % 50 mL IVPB  Status:  Discontinued        1.5 mg/kg 100 mL/hr over 30 Minutes Intravenous  Once 11/01/11 1310 11/01/11 1404   11/01/11 1130   vancomycin (VANCOCIN) IVPB 1000 mg/200 mL premix        1,000 mg 200 mL/hr over 60 Minutes Intravenous  Once 11/01/11 1127 11/01/11 1441   11/01/11 1130   cefTRIAXone (ROCEPHIN) 1 g in dextrose 5 % 50 mL IVPB        1 g 100 mL/hr over 30 Minutes Intravenous  Once 11/01/11 1127 11/01/11 1205         11/03/2011

## 2011-11-03 NOTE — Progress Notes (Signed)
ANTIBIOTIC CONSULT NOTE - Follow-Up  Pharmacy Consult for Zosyn Indication: Necrotizing Fasciitis  Allergies  Allergen Reactions  . Diclofenac Itching    Feels difficult to swallow, but no tongue swelling or SOB    Patient Measurements: Weight: 206 lb 2.1 oz (93.5 kg)   Vital Signs: Temp: 98.3 F (36.8 C) (04/06 1115) Temp src: Oral (04/06 1115) BP: 108/84 mmHg (04/06 0700) Pulse Rate: 81  (04/06 0700) Intake/Output from previous day: 04/05 0701 - 04/06 0700 In: 2390 [P.O.:540; I.V.:1200; IV Piggyback:650] Out: 1275 [Urine:1275] Intake/Output from this shift:    Labs:  Basename 11/03/11 1108 11/02/11 0359 11/01/11 1930 11/01/11 1731 11/01/11 1557 11/01/11 1121  WBC -- 12.3* -- 21.9* -- 19.3*  HGB -- 10.0* -- 11.2* 11.6* --  PLT -- 277 -- 334 -- 343  LABCREA -- -- -- -- -- --  CREATININE 0.69 0.68 0.64 -- -- --   CrCl is unknown because there is no height on file for the current visit.  Basename 11/03/11 1108  VANCOTROUGH 14.1  VANCOPEAK --  VANCORANDOM --  GENTTROUGH --  GENTPEAK --  GENTRANDOM --  TOBRATROUGH --  TOBRAPEAK --  TOBRARND --  AMIKACINPEAK --  AMIKACINTROU --  AMIKACIN --     Microbiology: Recent Results (from the past 720 hour(s))  CULTURE, BLOOD (ROUTINE X 2)     Status: Normal (Preliminary result)   Collection Time   11/01/11 12:50 PM      Component Value Range Status Comment   Specimen Description BLOOD ARM LEFT   Final    Special Requests     Final    Value: BOTTLES DRAWN AEROBIC AND ANAEROBIC AERO 10CC, ANAE 5CC PT ON ROCEPHIN   Culture  Setup Time [id number]   Final    Culture     Final    Value:        BLOOD CULTURE RECEIVED NO GROWTH TO DATE CULTURE WILL BE HELD FOR 5 DAYS BEFORE ISSUING A FINAL NEGATIVE REPORT   Report Status PENDING   Incomplete   CULTURE, BLOOD (ROUTINE X 2)     Status: Normal (Preliminary result)   Collection Time   11/01/11  1:05 PM      Component Value Range Status Comment   Specimen Description BLOOD  HAND LEFT   Final    Special Requests BOTTLES DRAWN AEROBIC ONLY 10CC PT ON ROCEPHIN   Final    Culture  Setup Time [id number]   Final    Culture     Final    Value:        BLOOD CULTURE RECEIVED NO GROWTH TO DATE CULTURE WILL BE HELD FOR 5 DAYS BEFORE ISSUING A FINAL NEGATIVE REPORT   Report Status PENDING   Incomplete   GRAM STAIN     Status: Normal   Collection Time   11/01/11  4:32 PM      Component Value Range Status Comment   Specimen Description ABSCESS LABIA LEFT   Final    Special Requests PATIENT ON FOLLOWING ZOSYN, VANCOMYCIN, ROCEPHIN   Final    Gram Stain     Final    Value: MODERATE WBC PRESENT,BOTH PMN AND MONONUCLEAR     ABUNDANT GRAM POSITIVE COCCI IN PAIRS IN CHAINS     MODERATE GRAM NEGATIVE RODS   Report Status 11/01/2011 FINAL   Final   ANAEROBIC CULTURE     Status: Normal (Preliminary result)   Collection Time   11/01/11  4:32 PM  Component Value Range Status Comment   Specimen Description ABSCESS LABIA LEFT   Final    Special Requests PATIENT ON FOLLOWING ZOSYN, VANCOMYCIN, ROCEPHIN   Final    Gram Stain     Final    Value: MODERATE WBC PRESENT,BOTH PMN AND MONONUCLEAR     ABUNDANT GRAM POSITIVE COCCI IN PAIRS AND CHAINS     MODERATE GRAM NEGATIVE RODS     Performed at [facility]   Culture     Final    Value: NO ANAEROBES ISOLATED; CULTURE IN PROGRESS FOR 5 DAYS   Report Status PENDING   Incomplete   CULTURE, ROUTINE-ABSCESS     Status: Normal (Preliminary result)   Collection Time   11/01/11  4:32 PM      Component Value Range Status Comment   Specimen Description ABSCESS LABIA LEFT   Final    Special Requests PATIENT ON FOLLOWING ZOSYN, VANCOMYCIN, ROCEPHIN   Final    Gram Stain     Final    Value: MODERATE WBC PRESENT,BOTH PMN AND MONONUCLEAR     ABUNDANT GRAM POSITIVE COCCI IN PAIRS AND CHAINS     MODERATE GRAM NEGATIVE RODS     Performed at [facility]   Culture NO GROWTH 1 DAY   Final    Report Status PENDING   Incomplete     MRSA PCR SCREENING     Status: Normal   Collection Time   11/01/11  7:18 PM      Component Value Range Status Comment   MRSA by PCR NEGATIVE  NEGATIVE  Final     Medical History: Past Medical History  Diagnosis Date  . Hypertension   . Hyperlipidemia   . Cancer     breast cancer  . Anal stenosis     Medications:  Anti-infectives     Start     Dose/Rate Route Frequency Ordered Stop   11/02/11 0000   vancomycin (VANCOCIN) 1,250 mg in sodium chloride 0.9 % 250 mL IVPB        1,250 mg 166.7 mL/hr over 90 Minutes Intravenous Every 12 hours 11/01/11 1858     11/01/11 2200   piperacillin-tazobactam (ZOSYN) IVPB 3.375 g        3.375 g 12.5 mL/hr over 240 Minutes Intravenous 3 times per day 11/01/11 1843     11/01/11 1351   piperacillin-tazobactam (ZOSYN) 3-0.375 GM/50ML IVPB     Comments: KOHUT, NIKKI: cabinet override         11/01/11 1351 11/01/11 1400   11/01/11 1315   gentamicin (GARAMYCIN) 1.5 mg/kg in dextrose 5 % 50 mL IVPB  Status:  Discontinued        1.5 mg/kg 100 mL/hr over 30 Minutes Intravenous  Once 11/01/11 1310 11/01/11 1404   11/01/11 1130   vancomycin (VANCOCIN) IVPB 1000 mg/200 mL premix        1,000 mg 200 mL/hr over 60 Minutes Intravenous  Once 11/01/11 1127 11/01/11 1441   11/01/11 1130   cefTRIAXone (ROCEPHIN) 1 g in dextrose 5 % 50 mL IVPB        1 g 100 mL/hr over 30 Minutes Intravenous  Once 11/01/11 1127 11/01/11 1205         Assessment: Necrotizing Fasciitis:  Pt continue broad-spectrum antimicrobial therapy with Zosyn and Vancomycin.  Pt s/p I&D on 4/4.  Vancomycin trough slightly below desired target range but patient is afebrile, WBC trending down, renal function is stable, and wound improved per surgery.  Wound, blood cx pending- wound initially with GPC and GNR.    Goal of Therapy:  Vancomycin trough 15-20 mcg/ml  Plan:  1) Continue Vancomycin 1250mg  IV q12h  2) Continue Zosyn 3.375gm IV q8h extended infusion 3) Monitor renal function  and microbiologic data.  Junita Push, PharmD, BCPS 11/03/2011, 1:00 PM

## 2011-11-04 LAB — GLUCOSE, CAPILLARY: Glucose-Capillary: 110 mg/dL — ABNORMAL HIGH (ref 70–99)

## 2011-11-04 LAB — BASIC METABOLIC PANEL
CO2: 25 mEq/L (ref 19–32)
Calcium: 8.8 mg/dL (ref 8.4–10.5)
Chloride: 96 mEq/L (ref 96–112)
Glucose, Bld: 130 mg/dL — ABNORMAL HIGH (ref 70–99)
Sodium: 133 mEq/L — ABNORMAL LOW (ref 135–145)

## 2011-11-04 LAB — PROTIME-INR
INR: 1.17 (ref 0.00–1.49)
Prothrombin Time: 15.1 seconds (ref 11.6–15.2)

## 2011-11-04 MED ORDER — WARFARIN - PHARMACIST DOSING INPATIENT
Freq: Every day | Status: DC
  Administered 2011-11-08 – 2011-11-12 (×4)

## 2011-11-04 MED ORDER — DEXTROSE-NACL 5-0.45 % IV SOLN
INTRAVENOUS | Status: DC
  Administered 2011-11-04 – 2011-11-10 (×9): via INTRAVENOUS

## 2011-11-04 MED ORDER — LACTATED RINGERS IV SOLN: INTRAVENOUS | Status: DC

## 2011-11-04 MED ORDER — WARFARIN SODIUM 5 MG PO TABS
5.0000 mg | ORAL_TABLET | Freq: Once | ORAL | Status: AC
  Administered 2011-11-04: 5 mg via ORAL
  Filled 2011-11-04: qty 1

## 2011-11-04 NOTE — Progress Notes (Signed)
3 Days Post-Op  Subjective: Looks good.  Pain controlled  Objective: Vital signs in last 24 hours: Temp:  [98 F (36.7 C)-99.6 F (37.6 C)] 98.2 F (36.8 C) (04/07 0500) Pulse Rate:  [83-105] 92  (04/07 0945) Resp:  [18-22] 18  (04/07 0500) BP: (109-121)/(54-70) 113/68 mmHg (04/07 0945) SpO2:  [92 %-96 %] 96 % (04/07 0500)    Intake/Output from previous day: 04/06 0701 - 04/07 0700 In: 2390 [P.O.:540; I.V.:1250; IV Piggyback:600] Out: 1200 [Urine:1200] Intake/Output this shift:    Wound left groin/labia area clean, no purulence or cellulitis  Lab Results:   [facility] 11/02/11 0359 11/01/11 1731  WBC 12.3* 21.9*  HGB 10.0* 11.2*  HCT 29.2* 32.9*  PLT 277 334   BMET  Basename 11/03/11 1108 11/02/11 0359  NA 132* 132*  K 2.9* 3.2*  CL 93* 94*  CO2 30 30  GLUCOSE 128* 120*  BUN 10 12  CREATININE 0.69 0.68  CALCIUM 8.4 8.0*   PT/INR No results found for this basename: LABPROT:2,INR:2 in the last 72 hours ABG  Basename 11/01/11 1557  PHART 7.452*  HCO3 29.4*    Studies/Results: No results found.  Anti-infectives: Anti-infectives     Start     Dose/Rate Route Frequency Ordered Stop   11/02/11 0000   vancomycin (VANCOCIN) 1,250 mg in sodium chloride 0.9 % 250 mL IVPB        1,250 mg 166.7 mL/hr over 90 Minutes Intravenous Every 12 hours 11/01/11 1858     11/01/11 2200  piperacillin-tazobactam (ZOSYN) IVPB 3.375 g       3.375 g 12.5 mL/hr over 240 Minutes Intravenous 3 times per day 11/01/11 1843     11/01/11 1351  piperacillin-tazobactam (ZOSYN) 3-0.375 GM/50ML IVPB    Comments: KOHUT, NIKKI: cabinet override        11/01/11 1351 11/01/11 1400   11/01/11 1315   gentamicin (GARAMYCIN) 1.5 mg/kg in dextrose 5 % 50 mL IVPB  Status:  Discontinued        1.5 mg/kg 100 mL/hr over 30 Minutes Intravenous  Once 11/01/11 1310 11/01/11 1404   11/01/11 1130   vancomycin (VANCOCIN) IVPB 1000 mg/200 mL premix        1,000 mg 200 mL/hr over 60 Minutes  Intravenous  Once 11/01/11 1127 11/01/11 1441   11/01/11 1130   cefTRIAXone (ROCEPHIN) 1 g in dextrose 5 % 50 mL IVPB        1 g 100 mL/hr over 30 Minutes Intravenous  Once 11/01/11 1127 11/01/11 1205          Assessment/Plan: s/p Procedure(s) (LRB): IRRIGATION AND DEBRIDEMENT PERIRECTAL ABSCESS (N/A)  Continue current wound care Will not need further debridement in OR Ok to resume coumadin from gen surg standpoint  LOS: 3 days    [name] A 11/04/2011

## 2011-11-04 NOTE — Progress Notes (Signed)
SUBJECTIVE:  No complaints today OBJECTIVE:   Vitals:   Filed Vitals:   11/03/11 1529 11/03/11 1807 11/03/11 2100 11/04/11 0500  BP:  120/70 121/54 109/56  Pulse:  88 105 83  Temp: 98 F (36.7 C) 99.6 F (37.6 C) 98.9 F (37.2 C) 98.2 F (36.8 C)  TempSrc: Oral Oral    Resp:  22 18 18   Weight:      SpO2:  95% 92% 96%   I&O's:   Intake/Output Summary (Last 24 hours) at 11/04/11 0649 Last data filed at 11/04/11 0500  Gross per 24 hour  Intake  602.5 ml  Output   1250 ml  Net -647.5 ml   TELEMETRY: Reviewed telemetry pt in atrial fibrillation     PHYSICAL EXAM General: Well developed, well nourished, in no acute distress Head: Eyes PERRLA, No xanthomas.   Normal cephalic and atramatic  Lungs:   Clear bilaterally to auscultation and percussion. Heart:   Irregularly irregular S1 S2 Pulses are 2+ & equal. Abdomen: Bowel sounds are positive, abdomen soft and non-tender without masses  Extremities:   No clubbing, cyanosis or edema.  DP +1 Neuro: Alert and oriented X 3. Psych:  Good affect, responds appropriately   LABS: Basic Metabolic Panel:  Basename 11/03/11 1108 11/02/11 0359  NA 132* 132*  K 2.9* 3.2*  CL 93* 94*  CO2 30 30  GLUCOSE 128* 120*  BUN 10 12  CREATININE 0.69 0.68  CALCIUM 8.4 8.0*  MG -- --  PHOS -- --   Liver Function Tests:  [facility] 11/02/11 0359  AST 135*  ALT 274*  ALKPHOS 124*  BILITOT 0.4  PROT 5.5*  ALBUMIN 2.0*   No results found for this basename: LIPASE:2,AMYLASE:2 in the last 72 hours CBC:  Basename 11/02/11 0359 11/01/11 1731  WBC 12.3* 21.9*  NEUTROABS 10.4* 19.3*  HGB 10.0* 11.2*  HCT 29.2* 32.9*  MCV 88.0 86.8  PLT 277 334   Coag Panel:   Lab Results  Component Value Date   INR 0.94 10/10/2009    RADIOLOGY: Dg Chest 1 View  11/01/2011  *RADIOLOGY REPORT*  Clinical Data: Shortness of breath.  CHEST - 1 VIEW  Comparison: 04/10/2011  Findings: There is peribronchial thickening.  Low lung volumes. Heart size is  accentuated by the low volumes.  No effusions or acute bony abnormality.  IMPRESSION: Low lung volumes, peribronchial thickening.  Original Report Authenticated By: Cyndie Chime, M.D.   Ct Pelvis W Contrast  11/01/2011  *RADIOLOGY REPORT*  Clinical Data:  Suspected left labial/perirectal abscess  CT PELVIS WITH CONTRAST  Technique:  Multidetector CT imaging of the pelvis was performed using the standard protocol following the bolus administration of intravenous contrast.  Contrast:   100 ml Omnipaque-300 IV  Comparison:  None.  Findings:  No drainable fluid collection or abscess.  Extensive soft tissue gas predominately involving the left labial/peritoneal region (series 2/image 51), highly suspicious for necrotizing fasciitis/Fournier's gangrene. Gas extends into the left lower abdominopelvic wall (series 2/image 36), left medial thigh (series 2/image 61), bilateral ischiorectal fossa (series 2/image 41), and extending into the right lower pelvis (series 2/image 30) via the obturator internus.  Of note, the gas in the left medial thigh is incompletely visualized in its inferior extent.  Layering contrast in the bladder.  Status post hysterectomy.  No adnexal masses.  Colonic diverticulosis, without associate inflammatory changes.  No pelvic ascites.  No suspicious pelvic lymphadenopathy.  Degenerative changes at L4-5.  IMPRESSION: Suspected necrotizing fasciitis/Fournier's gangrene,  predominantly involving the left perineal region as described above, but also involving the left medial thigh (incompletely imaged) and extending from the right ischiorectal fascia into the right lower pelvis. Emergent surgical and ID consultation is suggested.  Critical Value/emergent results were called by telephone at the time of interpretation on 11/01/2011  at 1254 hours  to  Dr. Azalia Bilis, who verbally acknowledged these results.  Original Report Authenticated By: Charline Bills, M.D.      ASSESSMENT:  1. Atrial  fibrillation of unknown duration with good rate control on beta blockers.  2. Necrotizing fasciitis, perineal per surgery 3. Hypertension controlled 4. Hypokalemia  PLAN:   1.  Repeat BMET pending this am after repletion of potassium 2.  start coumadin for afib when ok with surgery 3.  Continue metoprolol for rate control  Quintella Reichert, MD  11/04/2011  6:49 AM

## 2011-11-04 NOTE — Progress Notes (Signed)
ANTICOAGULATION CONSULT NOTE - Initial Consult  Pharmacy Consult for coumadin Indication: atrial fibrillation  Allergies  Allergen Reactions  . Diclofenac Itching    Feels difficult to swallow, but no tongue swelling or SOB    Patient Measurements: Weight: 206 lb 2.1 oz (93.5 kg) Heparin Dosing Weight:   Vital Signs: Temp: 98.2 F (36.8 C) (04/07 0500) BP: 113/68 mmHg (04/07 0945) Pulse Rate: 92  (04/07 0945)  Labs:  Basename 11/04/11 1122 11/03/11 1108 11/02/11 0359 11/01/11 1731 11/01/11 1557  HGB -- -- 10.0* 11.2* --  HCT -- -- 29.2* 32.9* 34.0*  PLT -- -- 277 334 --  APTT -- -- -- -- --  LABPROT -- -- -- -- --  INR -- -- -- -- --  HEPARINUNFRC -- -- -- -- --  CREATININE 1.45* 0.69 0.68 -- --  CKTOTAL -- -- -- -- --  CKMB -- -- -- -- --  TROPONINI -- -- -- -- --   CrCl is unknown because there is no height on file for the current visit.  Medical History: Past Medical History  Diagnosis Date  . Hypertension   . Hyperlipidemia   . Cancer     breast cancer  . Anal stenosis     Medications:  Scheduled:    . bisacodyl  10 mg Oral Daily  . enoxaparin  40 mg Subcutaneous Q24H  . metoprolol tartrate  25 mg Oral BID  . piperacillin-tazobactam (ZOSYN)  IV  3.375 g Intravenous Q8H  . senna-docusate  1 tablet Oral BID  . vancomycin  1,250 mg Intravenous Q12H  . DISCONTD: potassium chloride  60 mEq Oral BID   Infusions:    . dextrose 5 % and 0.45 % NaCl with KCl 20 mEq/L 50 mL/hr at 11/03/11 2233  . DISCONTD: lactated ringers    . DISCONTD: lactated ringers      Assessment: 71 yo female with afib will be started on coumadin. No INR this admission (but not on anticoag in the past). Coumadin score = 4 Goal of Therapy:  INR 2-3   Plan:  1) Coumadin 5mg  po x1 2) Stat INR 3) Daily PT/INR  Mandela Bello, Tsz-Yin 11/04/2011,2:45 PM

## 2011-11-05 ENCOUNTER — Encounter (HOSPITAL_COMMUNITY): Payer: Self-pay | Admitting: General Practice

## 2011-11-05 DIAGNOSIS — I4891 Unspecified atrial fibrillation: Secondary | ICD-10-CM

## 2011-11-05 DIAGNOSIS — N289 Disorder of kidney and ureter, unspecified: Secondary | ICD-10-CM

## 2011-11-05 LAB — PROTIME-INR
INR: 1.21 (ref 0.00–1.49)
Prothrombin Time: 15.6 seconds — ABNORMAL HIGH (ref 11.6–15.2)

## 2011-11-05 LAB — VANCOMYCIN, TROUGH: Vancomycin Tr: 30.5 ug/mL (ref 10.0–20.0)

## 2011-11-05 LAB — BASIC METABOLIC PANEL
GFR calc Af Amer: 30 mL/min — ABNORMAL LOW (ref >90)
GFR calc non Af Amer: 26 mL/min — ABNORMAL LOW (ref >90)
Glucose, Bld: 140 mg/dL — ABNORMAL HIGH (ref 70–99)
Potassium: 4.4 mEq/L (ref 3.5–5.1)
Sodium: 135 mEq/L (ref 135–145)

## 2011-11-05 MED ORDER — PATIENT'S GUIDE TO USING COUMADIN BOOK
Freq: Once | Status: AC
  Administered 2011-11-05: 18:00:00
  Filled 2011-11-05: qty 1

## 2011-11-05 MED ORDER — OFF THE BEAT BOOK
Freq: Once | Status: DC
  Filled 2011-11-05: qty 1

## 2011-11-05 MED ORDER — OFF THE BEAT BOOK
Freq: Once | Status: AC
  Administered 2011-11-05: 12:00:00
  Filled 2011-11-05: qty 1

## 2011-11-05 MED ORDER — WARFARIN VIDEO
Freq: Once | Status: AC
  Administered 2011-11-06: 11:00:00

## 2011-11-05 MED ORDER — WARFARIN SODIUM 5 MG PO TABS
5.0000 mg | ORAL_TABLET | Freq: Once | ORAL | Status: AC
  Administered 2011-11-05: 5 mg via ORAL
  Filled 2011-11-05: qty 1

## 2011-11-05 MED ORDER — DEXTROSE 5 % IV SOLN
1.0000 g | INTRAVENOUS | Status: DC
  Administered 2011-11-05 – 2011-11-11 (×7): 1 g via INTRAVENOUS
  Filled 2011-11-05 (×8): qty 10

## 2011-11-05 NOTE — Progress Notes (Signed)
CV status stable. When safe, we sholud start anti embolic therapy for a fib. Coumadin is preferred when safe. Short term, ASA is better than nothing.

## 2011-11-05 NOTE — Progress Notes (Signed)
ANTICOAGULATION CONSULT NOTE - Follow Up Consult  Pharmacy Consult for Coumadin Indication: atrial fibrillation  Allergies  Allergen Reactions  . Diclofenac Itching    Feels difficult to swallow, but no tongue swelling or SOB    Patient Measurements: Height: 5\' 7"  (170.2 cm) Weight: 206 lb 2.1 oz (93.5 kg) IBW/kg (Calculated) : 61.6   Vital Signs: Temp: 98.6 F (37 C) (04/08 0500) BP: 119/75 mmHg (04/08 0918) Pulse Rate: 83  (04/08 0918)  Labs:  Basename 11/05/11 0620 11/04/11 1500 11/04/11 1122 11/03/11 1108  HGB -- -- -- --  HCT -- -- -- --  PLT -- -- -- --  APTT -- -- -- --  LABPROT 15.6* 15.1 -- --  INR 1.21 1.17 -- --  HEPARINUNFRC -- -- -- --  CREATININE 1.87* -- 1.45* 0.69  CKTOTAL -- -- -- --  CKMB -- -- -- --  TROPONINI -- -- -- --   Estimated Creatinine Clearance: 31.9 ml/min (by C-G formula based on Cr of 1.87).  Assessment:   INR with little change after Coumadin 5 mg x 1 on 4/7.   On Lovenox 40 mg SQ q24h since 4/5.    Scr up, Vancomycin and Zosyn changed to Ceftriaxone.   Strep in wound culture.   Transaminases were elevated on 4/5.  Off home statin.    Increased LFTs could effect Coumadin dosing.    Goal of Therapy:  INR 2-3   Plan:    Will repeat Coumadin 5 mg PO today.   Continue Lovenox 40 mg SQ Q24hrs.   Continue daily PT/INR.  Will add CBC for am.   Will also change am bmet to cmet, to recheck LFTs.    Resume home Paxil?  Dennie Fetters, RPh Pager: [phone] 11/05/2011,10:41 AM

## 2011-11-05 NOTE — Progress Notes (Signed)
ANTIBIOTIC CONSULT NOTE - Follow-Up  Pharmacy Consult for Vancomycin Indication: Necrotizing Fasciitis  Allergies  Allergen Reactions  . Diclofenac Itching    Feels difficult to swallow, but no tongue swelling or SOB    Assessment: 71 yo female with perirectal abcess/necrotizing fasciitis for empiric antibiotics.  SCr doubled from 0.69 4/6 to 1.45 4/7.  As a result, vancomycin trough checked and found to have doubled as well.   Noted wound culture growing Group F Strep.  Goal of Therapy:  Vancomycin trough 15-20 mcg/ml  Plan:  Continue to hold Vancomycin.  F/U renal function and restarted if SCr stabilizes.  Consider narrowing antibiotics.  Labs:  [facility] 11/04/11 1122 11/03/11 1108 11/02/11 0359  WBC -- -- 12.3*  HGB -- -- 10.0*  PLT -- -- 277  LABCREA -- -- --  CREATININE 1.45* 0.69 0.68   CrCl is unknown because there is no height on file for the current visit.  Basename 11/04/11 2340 11/03/11 1108  VANCOTROUGH 30.5* 14.1  VANCOPEAK -- --  VANCORANDOM -- --  GENTTROUGH -- --  GENTPEAK -- --  GENTRANDOM -- --  TOBRATROUGH -- --  TOBRAPEAK -- --  TOBRARND -- --  AMIKACINPEAK -- --  AMIKACINTROU -- --  AMIKACIN -- --     Microbiology: Recent Results (from the past 720 hour(s))  CULTURE, BLOOD (ROUTINE X 2)     Status: Normal (Preliminary result)   Collection Time   11/01/11 12:50 PM      Component Value Range Status Comment   Specimen Description BLOOD ARM LEFT   Final    Special Requests     Final    Value: BOTTLES DRAWN AEROBIC AND ANAEROBIC AERO 10CC, ANAE 5CC PT ON ROCEPHIN   Culture  Setup Time [id number]   Final    Culture     Final    Value:        BLOOD CULTURE RECEIVED NO GROWTH TO DATE CULTURE WILL BE HELD FOR 5 DAYS BEFORE ISSUING A FINAL NEGATIVE REPORT   Report Status PENDING   Incomplete   CULTURE, BLOOD (ROUTINE X 2)     Status: Normal (Preliminary result)   Collection Time   11/01/11  1:05 PM      Component Value Range Status Comment   Specimen Description BLOOD HAND LEFT   Final    Special Requests BOTTLES DRAWN AEROBIC ONLY 10CC PT ON ROCEPHIN   Final    Culture  Setup Time [id number]   Final    Culture     Final    Value:        BLOOD CULTURE RECEIVED NO GROWTH TO DATE CULTURE WILL BE HELD FOR 5 DAYS BEFORE ISSUING A FINAL NEGATIVE REPORT   Report Status PENDING   Incomplete   GRAM STAIN     Status: Normal   Collection Time   11/01/11  4:32 PM      Component Value Range Status Comment   Specimen Description ABSCESS LABIA LEFT   Final    Special Requests PATIENT ON FOLLOWING ZOSYN, VANCOMYCIN, ROCEPHIN   Final    Gram Stain     Final    Value: MODERATE WBC PRESENT,BOTH PMN AND MONONUCLEAR     ABUNDANT GRAM POSITIVE COCCI IN PAIRS IN CHAINS     MODERATE GRAM NEGATIVE RODS   Report Status 11/01/2011 FINAL   Final   ANAEROBIC CULTURE     Status: Normal (Preliminary result)   Collection Time   11/01/11  4:32 PM  Component Value Range Status Comment   Specimen Description ABSCESS LABIA LEFT   Final    Special Requests PATIENT ON FOLLOWING ZOSYN, VANCOMYCIN, ROCEPHIN   Final    Gram Stain     Final    Value: MODERATE WBC PRESENT,BOTH PMN AND MONONUCLEAR     ABUNDANT GRAM POSITIVE COCCI IN PAIRS AND CHAINS     MODERATE GRAM NEGATIVE RODS     Performed at [facility]   Culture     Final    Value: NO ANAEROBES ISOLATED; CULTURE IN PROGRESS FOR 5 DAYS   Report Status PENDING   Incomplete   CULTURE, ROUTINE-ABSCESS     Status: Normal   Collection Time   11/01/11  4:32 PM      Component Value Range Status Comment   Specimen Description ABSCESS LABIA LEFT   Final    Special Requests PATIENT ON FOLLOWING ZOSYN, VANCOMYCIN, ROCEPHIN   Final    Gram Stain     Final    Value: MODERATE WBC PRESENT,BOTH PMN AND MONONUCLEAR     ABUNDANT GRAM POSITIVE COCCI IN PAIRS AND CHAINS     MODERATE GRAM NEGATIVE RODS     Performed at [facility]   Culture MODERATE STREPTOCOCCUS GROUP F   Final    Report Status  11/03/2011 FINAL   Final   MRSA PCR SCREENING     Status: Normal   Collection Time   11/01/11  7:18 PM      Component Value Range Status Comment   MRSA by PCR NEGATIVE  NEGATIVE  Final       Geannie Risen, PharmD, BCPS  11/05/2011, 12:49 AM

## 2011-11-05 NOTE — Progress Notes (Signed)
UR of chart completed.  

## 2011-11-05 NOTE — Progress Notes (Signed)
I agree with the assessments and medication administration by Megan Roberts [employer] student. 

## 2011-11-05 NOTE — Progress Notes (Signed)
Spoke with pt and husband about HH needs at discharge. Pt has used [facility] in the past for HHPT when she had knee surgery and would like to remain with them if [facility] is the path she chooses.  Husband pointed out that they have no other family locally to assist them and he would need to be able to manage the patient himself and right now she is 2+ assist to even move in the bed or bed to chair. Discussed with them the option of short term SNF Placement and they were open to this suggestion. Pt's mobility is limited due to her incision. I explained the process of FL2 and SNF choices.  Also discussed the option of having a sitter agency person to be in the home when the husband has to leave the house before the patient is fully independent. They are willing to consider any of these options depending on the patient's need at the time. Communicated all of this to [place], [place] for surgery. Await PT/OT evals to determine the level of the patient's need re: mobility.

## 2011-11-05 NOTE — Progress Notes (Signed)
4 Days Post-Op  Subjective: C/O tongue very dry Wound area feels better  Objective: Vital signs in last 24 hours: Temp:  [98.1 F (36.7 C)-98.7 F (37.1 C)] 98.6 F (37 C) (04/08 0500) Pulse Rate:  [83-98] 83  (04/08 0918) Resp:  [18-20] 18  (04/08 0500) BP: (104-119)/(57-75) 119/75 mmHg (04/08 0918) SpO2:  [91 %-95 %] 91 % (04/08 0500) Last BM Date: 10/31/11 (pt unsure of LBM, states it was before 11/01/11)  Intake/Output from previous day: 04/07 0701 - 04/08 0700 In: 350 [IV Piggyback:350] Out: 1750 [Urine:1750] Intake/Output this shift:    General appearance: alert and cooperative Resp: clear to auscultation bilaterally Cardio: irregularly irregular rhythm Incision/Wound: Clean and starting to granulate  Lab Results:  No results found for this basename: WBC:2,HGB:2,HCT:2,PLT:2 in the last 72 hours BMET  [facility] 11/05/11 0620 11/04/11 1122  NA 135 133*  K 4.4 4.9  CL 101 96  CO2 25 25  GLUCOSE 140* 130*  BUN 20 17  CREATININE 1.87* 1.45*  CALCIUM 8.5 8.8   PT/INR  Basename 11/05/11 0620 11/04/11 1500  LABPROT 15.6* 15.1  INR 1.21 1.17   ABG No results found for this basename: PHART:2,PCO2:2,PO2:2,HCO3:2 in the last 72 hours  Studies/Results: No results found.  Anti-infectives: Anti-infectives     Start     Dose/Rate Route Frequency Ordered Stop   11/02/11 0000   vancomycin (VANCOCIN) 1,250 mg in sodium chloride 0.9 % 250 mL IVPB  Status:  Discontinued        1,250 mg 166.7 mL/hr over 90 Minutes Intravenous Every 12 hours 11/01/11 1858 11/05/11 0057   11/01/11 2200   piperacillin-tazobactam (ZOSYN) IVPB 3.375 g        3.375 g 12.5 mL/hr over 240 Minutes Intravenous 3 times per day 11/01/11 1843     11/01/11 1351   piperacillin-tazobactam (ZOSYN) 3-0.375 GM/50ML IVPB     Comments: KOHUT, NIKKI: cabinet override         11/01/11 1351 11/01/11 1400   11/01/11 1315   gentamicin (GARAMYCIN) 1.5 mg/kg in dextrose 5 % 50 mL IVPB  Status:  Discontinued          1.5 mg/kg 100 mL/hr over 30 Minutes Intravenous  Once 11/01/11 1310 11/01/11 1404   11/01/11 1130   vancomycin (VANCOCIN) IVPB 1000 mg/200 mL premix        1,000 mg 200 mL/hr over 60 Minutes Intravenous  Once 11/01/11 1127 11/01/11 1441   11/01/11 1130   cefTRIAXone (ROCEPHIN) 1 g in dextrose 5 % 50 mL IVPB        1 g 100 mL/hr over 30 Minutes Intravenous  Once 11/01/11 1127 11/01/11 1205          Assessment/Plan: s/p Procedure(s) (LRB): IRRIGATION AND DEBRIDEMENT PERIRECTAL ABSCESS (N/A) S/P debridement necrotizing soft tissue infection L labia and groin ID - Cx group F strep - will narrow to rocephin only Dry tongue - magic MW, possible med related - will see how it goes with ABX change ARI - possibly due to Vanco - D/C it as above and hydrate A-fib - coumadin per pharmacy  LOS: 4 days    [name] E 11/05/2011

## 2011-11-06 LAB — COMPREHENSIVE METABOLIC PANEL
ALT: 108 U/L — ABNORMAL HIGH (ref 0–35)
Alkaline Phosphatase: 174 U/L — ABNORMAL HIGH (ref 39–117)
CO2: 22 mEq/L (ref 19–32)
Chloride: 101 mEq/L (ref 96–112)
GFR calc Af Amer: 32 mL/min — ABNORMAL LOW (ref >90)
GFR calc non Af Amer: 27 mL/min — ABNORMAL LOW (ref >90)
Glucose, Bld: 139 mg/dL — ABNORMAL HIGH (ref 70–99)
Potassium: 3.5 mEq/L (ref 3.5–5.1)
Sodium: 135 mEq/L (ref 135–145)
Total Bilirubin: 0.2 mg/dL — ABNORMAL LOW (ref 0.3–1.2)

## 2011-11-06 LAB — PROTIME-INR
INR: 1.3 (ref 0.00–1.49)
Prothrombin Time: 16.4 seconds — ABNORMAL HIGH (ref 11.6–15.2)

## 2011-11-06 LAB — URINALYSIS, ROUTINE W REFLEX MICROSCOPIC
Glucose, UA: NEGATIVE mg/dL
Hgb urine dipstick: NEGATIVE
Ketones, ur: NEGATIVE mg/dL
Protein, ur: NEGATIVE mg/dL

## 2011-11-06 LAB — CBC
Hemoglobin: 9.6 g/dL — ABNORMAL LOW (ref 12.0–15.0)
RBC: 3.24 MIL/uL — ABNORMAL LOW (ref 3.87–5.11)

## 2011-11-06 MED ORDER — WARFARIN SODIUM 5 MG PO TABS
5.0000 mg | ORAL_TABLET | Freq: Once | ORAL | Status: AC
  Administered 2011-11-06: 5 mg via ORAL
  Filled 2011-11-06 (×2): qty 1

## 2011-11-06 MED ORDER — MAGIC MOUTHWASH
10.0000 mL | Freq: Three times a day (TID) | ORAL | Status: DC | PRN
  Filled 2011-11-06: qty 10

## 2011-11-06 MED ORDER — METOPROLOL TARTRATE 50 MG PO TABS
50.0000 mg | ORAL_TABLET | Freq: Two times a day (BID) | ORAL | Status: DC
  Administered 2011-11-06 – 2011-11-12 (×13): 50 mg via ORAL
  Filled 2011-11-06 (×14): qty 1

## 2011-11-06 MED ORDER — PAROXETINE HCL 20 MG PO TABS
40.0000 mg | ORAL_TABLET | Freq: Every day | ORAL | Status: DC
  Administered 2011-11-06 – 2011-11-12 (×7): 40 mg via ORAL
  Filled 2011-11-06 (×7): qty 2

## 2011-11-06 NOTE — Progress Notes (Signed)
ANTICOAGULATION CONSULT NOTE - Follow Up Consult  Pharmacy Consult for Coumadin Indication: atrial fibrillation  Allergies  Allergen Reactions  . Diclofenac Itching    Feels difficult to swallow, but no tongue swelling or SOB    Patient Measurements: Height: 5\' 7"  (170.2 cm) Weight: 206 lb 2.1 oz (93.5 kg) IBW/kg (Calculated) : 61.6   Vital Signs: Temp: 97.9 F (36.6 C) (04/09 0830) Temp src: Oral (04/09 0830) BP: 124/72 mmHg (04/09 0830) Pulse Rate: 88  (04/09 0830)  Labs:  Basename 11/06/11 0615 11/05/11 0620 11/04/11 1500 11/04/11 1122  HGB 9.6* -- -- --  HCT 28.3* -- -- --  PLT 501* -- -- --  APTT -- -- -- --  LABPROT 16.4* 15.6* 15.1 --  INR 1.30 1.21 1.17 --  HEPARINUNFRC -- -- -- --  CREATININE 1.78* 1.87* -- 1.45*  CKTOTAL -- -- -- --  CKMB -- -- -- --  TROPONINI -- -- -- --   Estimated Creatinine Clearance: 33.6 ml/min (by C-G formula based on Cr of 1.78).  Assessment:   INR = 1.30   On Lovenox 40 mg SQ q24h since 4/5.    Increased LFTs could effect Coumadin dosing.    Goal of Therapy:  INR 2-3   Plan:   Will repeat Coumadin 5 mg PO today.  Continue Lovenox 40 mg SQ Q24hrs.  Continue daily PT/INR.    Okey Regal, PharmD Clinical Pharmacist [phone]

## 2011-11-06 NOTE — Progress Notes (Signed)
OT Cancellation Note  Treatment cancelled today due to Pt eating dinner. Will complete assessment tomorrow.Jannifer Hick 11/06/2011, 5:31 PM

## 2011-11-06 NOTE — Progress Notes (Signed)
5 Days Post-Op  Subjective: Feeling better, BM X 2, noted difficulty mobilizing with PT  Objective: Vital signs in last 24 hours: Temp:  [97.6 F (36.4 C)-98.6 F (37 C)] 97.6 F (36.4 C) (04/09 0600) Pulse Rate:  [83-100] 92  (04/09 0600) Resp:  [19-20] 20  (04/09 0600) BP: (119-138)/(75-81) 138/76 mmHg (04/09 0600) SpO2:  [93 %-94 %] 93 % (04/09 0600) Last BM Date: 11/06/11  Intake/Output from previous day: 04/08 0701 - 04/09 0700 In: 1100 [I.V.:1100] Out: 900 [Urine:900] Intake/Output this shift:    General appearance: alert and cooperative Resp: clear to auscultation bilaterally Cardio: irregularly irregular rhythm GI: soft, NT Wound very clean, no cellulitis or drainage  Lab Results:   [facility] 11/06/11 0615  WBC 27.7*  HGB 9.6*  HCT 28.3*  PLT 501*   BMET  Basename 11/06/11 0615 11/05/11 0620  NA 135 135  K 3.5 4.4  CL 101 101  CO2 22 25  GLUCOSE 139* 140*  BUN 17 20  CREATININE 1.78* 1.87*  CALCIUM 8.3* 8.5   PT/INR  Basename 11/06/11 0615 11/05/11 0620  LABPROT 16.4* 15.6*  INR 1.30 1.21   ABG No results found for this basename: PHART:2,PCO2:2,PO2:2,HCO3:2 in the last 72 hours  Studies/Results: No results found.  Anti-infectives: Anti-infectives     Start     Dose/Rate Route Frequency Ordered Stop   11/05/11 1200   cefTRIAXone (ROCEPHIN) 1 g in dextrose 5 % 50 mL IVPB        1 g 100 mL/hr over 30 Minutes Intravenous Every 24 hours 11/05/11 0926     11/02/11 0000   vancomycin (VANCOCIN) 1,250 mg in sodium chloride 0.9 % 250 mL IVPB  Status:  Discontinued        1,250 mg 166.7 mL/hr over 90 Minutes Intravenous Every 12 hours 11/01/11 1858 11/05/11 0057   11/01/11 2200   piperacillin-tazobactam (ZOSYN) IVPB 3.375 g  Status:  Discontinued        3.375 g 12.5 mL/hr over 240 Minutes Intravenous 3 times per day 11/01/11 1843 11/05/11 0926   11/01/11 1351   piperacillin-tazobactam (ZOSYN) 3-0.375 GM/50ML IVPB     Comments: KOHUT, NIKKI:  cabinet override         11/01/11 1351 11/01/11 1400   11/01/11 1315   gentamicin (GARAMYCIN) 1.5 mg/kg in dextrose 5 % 50 mL IVPB  Status:  Discontinued        1.5 mg/kg 100 mL/hr over 30 Minutes Intravenous  Once 11/01/11 1310 11/01/11 1404   11/01/11 1130   vancomycin (VANCOCIN) IVPB 1000 mg/200 mL premix        1,000 mg 200 mL/hr over 60 Minutes Intravenous  Once 11/01/11 1127 11/01/11 1441   11/01/11 1130   cefTRIAXone (ROCEPHIN) 1 g in dextrose 5 % 50 mL IVPB        1 g 100 mL/hr over 30 Minutes Intravenous  Once 11/01/11 1127 11/01/11 1205          Assessment/Plan: s/p Procedure(s) (LRB): IRRIGATION AND DEBRIDEMENT PERIRECTAL ABSCESS (N/A) S/P debridement necrotizing soft tissue infection L labia and groin ID - Cx group F strep - Rocephin, WBC back up but wound looks great, check U/A with Cx, LFTs improving Dry tongue - magic MW ARI - possibly due to Vanco - improving, continue hydration A-fib - coumadin per pharmacy VTE - lovenox plus coumadin  LOS: 5 days    [name] 11/06/2011

## 2011-11-06 NOTE — Evaluation (Signed)
Physical Therapy Evaluation Patient Details Name: [name] MRN: [id number] DOB: 1941/02/06 Today's Date: 11/06/2011  Problem List:  Patient Active Problem List  Diagnoses  . Hypertension  . Breast cancer  . Necrotizing fasciitis  . Hyperlipidemia  . Leukocytosis  . Sepsis  . Atrial fibrillation    Past Medical History:  Past Medical History  Diagnosis Date  . Hypertension   . Hyperlipidemia   . Cancer     breast cancer  . Anal stenosis   . Dysrhythmia     atrial fibrilation  . Sleep apnea     cpap  . Arthritis    Past Surgical History:  Past Surgical History  Procedure Date  . Anal dilation     multiple  . Abdominal hysterectomy   . Incision and drainage perirectal abscess 11/01/2011    Procedure: IRRIGATION AND DEBRIDEMENT PERIRECTAL ABSCESS;  Surgeon: Kandis Cocking, MD;  Location: [facility] OR;  Service: General;  Laterality: N/A;  Rigid Sigmiodoscopy ; Irrigation and Debridement left perineum and labia.  . Breast surgery     right mastectomy; left lumpectomy  . Joint replacement     right knee  . Colon surgery 1986    PT Assessment/Plan/Recommendation PT Assessment Clinical Impression Statement: Patient presents S/P I&D of perirectal abcess. She has had a decline in function since surgery and now presents with generalized weakness. We will follow acutely to attempt to increase mobility so patient can discharge home at a level that spouse can assist. PT Recommendation/Assessment: Patient will need skilled PT in the acute care venue PT Problem List: Decreased strength;Decreased activity tolerance;Decreased balance;Decreased mobility;Decreased knowledge of use of DME;Pain PT Therapy Diagnosis : Difficulty walking;Generalized weakness;Acute pain PT Plan PT Frequency: Min 3X/week PT Treatment/Interventions: DME instruction;Gait training;Stair training;Functional mobility training;Therapeutic activities;Therapeutic exercise;Balance training;Patient/family  education PT Recommendation Follow Up Recommendations: Home health PT;Supervision/Assistance - 24 hour Equipment Recommended: 3 in 1 bedside comode PT Goals  Acute Rehab PT Goals PT Goal Formulation: With patient Time For Goal Achievement: 2 weeks Pt will go Supine/Side to Sit: with supervision PT Goal: Supine/Side to Sit - Progress: Goal set today Pt will Sit at Edge of Bed: Independently;1-2 min;with no upper extremity support PT Goal: Sit at Edge Of Bed - Progress: Goal set today Pt will go Sit to Supine/Side: with supervision PT Goal: Sit to Supine/Side - Progress: Goal set today Pt will go Sit to Stand: with supervision;with upper extremity assist PT Goal: Sit to Stand - Progress: Goal set today Pt will go Stand to Sit: with supervision;with upper extremity assist PT Goal: Stand to Sit - Progress: Goal set today Pt will Transfer Bed to Chair/Chair to Bed: with supervision PT Transfer Goal: Bed to Chair/Chair to Bed - Progress: Goal set today Pt will Ambulate: 51 - 150 feet;with least restrictive assistive device;with supervision PT Goal: Ambulate - Progress: Goal set today Pt will Go Up / Down Stairs: 1-2 stairs;with rail(s) PT Goal: Up/Down Stairs - Progress: Goal set today  PT Evaluation Precautions/Restrictions  Precautions Precautions: Fall Prior Functioning  Home Living Lives With: Spouse Type of Home: House Home Layout: Able to live on main level with bedroom/bathroom;Two level Home Access: Stairs to enter Entrance Stairs-Rails: Left Entrance Stairs-Number of Steps: 2 Bathroom Shower/Tub: Health visitor: Handicapped height Home Adaptive Equipment: Straight cane;Walker - rolling Prior Function Level of Independence: Independent with basic ADLs;Independent with homemaking with ambulation Driving: Yes Vocation: Retired Financial risk analyst Arousal/Alertness: Awake/alert Overall Cognitive Status: Appears within functional limits for tasks  assessed Orientation Level: Oriented X4 Sensation/Coordination Sensation Light Touch: Appears Intact Proprioception: Appears Intact Coordination Gross Motor Movements are Fluid and Coordinated: Yes Fine Motor Movements are Fluid and Coordinated: Yes Extremity Assessment RLE Assessment RLE Assessment: Exceptions to [facility] RLE AROM (degrees) Overall AROM Right Lower Extremity: Within functional limits for tasks assessed RLE Strength RLE Overall Strength: Deficits RLE Overall Strength Comments: Grossly 4/5 LLE Assessment LLE Assessment: Exceptions to WFL LLE AROM (degrees) Overall AROM Left Lower Extremity: Within functional limits for tasks assessed LLE Strength LLE Overall Strength: Deficits LLE Overall Strength Comments: Grossly 4/5 Mobility (including Balance) Bed Mobility Bed Mobility: Yes Supine to Sit: 3: Mod assist;With rails;HOB elevated (Comment degrees) (20 degrees) Supine to Sit Details (indicate cue type and reason): Patient with difficulty with initiation of trunk. Verbal cues for sequnecing of movement to decrease pain and improve funtional efficiency Sitting - Scoot to Edge of Bed: 4: Min assist Sitting - Scoot to Edge of Bed Details (indicate cue type and reason): with use of pad. Patient with difficulty maintain balance while scooting leading to assistance required Transfers Transfers: Yes Sit to Stand: 3: Mod assist;With upper extremity assist;From bed Sit to Stand Details (indicate cue type and reason): Educated on hand placement for initiation from surface. Patient with difficulty and pulled up on walker despite education. Stand to Sit: 4: Min assist;With upper extremity assist;With armrests;To chair/3-in-1 Stand to Sit Details: To assist in control of descent Stand Pivot Transfers: 4: Min assist Stand Pivot Transfer Details (indicate cue type and reason): with rolling walker with pivotal steps.   Balance Balance Assessed: Yes Static Sitting Balance Static  Sitting - Balance Support: Bilateral upper extremity supported Static Sitting - Level of Assistance: 5: Stand by assistance Static Sitting - Comment/# of Minutes: Patient with tendency for posterior lean when upper extremities not supported - patient states sitting upright increases pain secondary to packing so prefers post lean. However, trunk muscle weakness noted. Static Standing Balance Static Standing - Balance Support: Bilateral upper extremity supported Static Standing - Level of Assistance: 5: Stand by assistance Exercise  General Exercises - Lower Extremity Ankle Circles/Pumps: AROM;Both;10 reps;Supine Quad Sets: AROM;Both;10 reps;Supine Gluteal Sets: AROM;Both;10 reps;Supine End of Session PT - End of Session Equipment Utilized During Treatment: Gait belt Activity Tolerance: Patient tolerated treatment well Patient left: in chair;with call bell in reach Nurse Communication: Mobility status for transfers General Behavior During Session: [facility] for tasks performed Cognition: [facility] for tasks performed  Edwyna Perfect, PT  Pager [phone]  11/06/2011, 9:34 AM

## 2011-11-06 NOTE — Progress Notes (Signed)
Patient Name: [name] Date of Encounter: 11/06/2011    SUBJECTIVE: No cardiac complaints. We discussed the implications of atrial fib and the need for antiembolic protestion.  TELEMETRY:  A fib with controlled rate, < 90 bpm. Filed Vitals:   11/05/11 0918 11/05/11 1300 11/05/11 2128 11/06/11 0600  BP: 119/75 131/75 127/81 138/76  Pulse: 83 100 94 92  Temp:  98.1 F (36.7 C) 98.6 F (37 C) 97.6 F (36.4 C)  TempSrc:   Oral   Resp:  20 19 20   Height: 5\' 7"  (1.702 m)     Weight:      SpO2:  94% 94% 93%    Intake/Output Summary (Last 24 hours) at 11/06/11 0749 Last data filed at 11/06/11 0700  Gross per 24 hour  Intake   1100 ml  Output    900 ml  Net    200 ml    LABS: Basic Metabolic Panel:  Basename 11/06/11 0615 11/05/11 0620  NA 135 135  K 3.5 4.4  CL 101 101  CO2 22 25  GLUCOSE 139* 140*  BUN 17 20  CREATININE 1.78* 1.87*  CALCIUM 8.3* 8.5  MG -- --  PHOS -- --   CBC:  Basename 11/06/11 0615  WBC 27.7*  NEUTROABS --  HGB 9.6*  HCT 28.3*  MCV 87.3  PLT 501*    Radiology/Studies:  No new.  Physical Exam: Blood pressure 138/76, pulse 92, temperature 97.6 F (36.4 C), temperature source Oral, resp. rate 20, height 5\' 7"  (1.702 m), weight 93.5 kg (206 lb 2.1 oz), SpO2 93.00%. Weight change:    Irregularly irregular rhythm. MNo murmur or rub.  ASSESSMENT:  1. A trial fib with rate control.    Plan:  1. Coumadin  2. Metoprolol for rate control.  3. OP determination about cardioversion(rhythm) vs rate control after she gets over acute illness.  4. Check BNP/TSH  5. Echocardiogram  Signed, Lesleigh Noe 11/06/2011, 7:49 AM

## 2011-11-06 NOTE — Progress Notes (Signed)
Pt has order to be on 5100.  Spoke with Dr. Katrinka Blazing (cardiology) in regards to moving pt off tele. He is ok with the patient moving to nontele floor.  Report called to Alona Bene, RN on 5100.  Pt to be tx via wheelchair to 5159.

## 2011-11-06 NOTE — Consult Note (Signed)
Reviewed By Caterin Tabares EdD 

## 2011-11-07 LAB — BASIC METABOLIC PANEL
BUN: 14 mg/dL (ref 6–23)
Calcium: 8.2 mg/dL — ABNORMAL LOW (ref 8.4–10.5)
Creatinine, Ser: 1.6 mg/dL — ABNORMAL HIGH (ref 0.50–1.10)
GFR calc Af Amer: 36 mL/min — ABNORMAL LOW (ref >90)
GFR calc non Af Amer: 31 mL/min — ABNORMAL LOW (ref >90)
Potassium: 2.9 mEq/L — ABNORMAL LOW (ref 3.5–5.1)

## 2011-11-07 LAB — CBC
HCT: 27.9 % — ABNORMAL LOW (ref 36.0–46.0)
MCHC: 33.7 g/dL (ref 30.0–36.0)
RDW: 14 % (ref 11.5–15.5)

## 2011-11-07 LAB — URINE CULTURE
Colony Count: NO GROWTH
Culture: NO GROWTH

## 2011-11-07 LAB — T3 UPTAKE: T3 Uptake Ratio: 38.6 % — ABNORMAL HIGH (ref 22.5–37.0)

## 2011-11-07 LAB — PROTIME-INR: INR: 1.68 — ABNORMAL HIGH (ref 0.00–1.49)

## 2011-11-07 MED ORDER — WARFARIN SODIUM 5 MG PO TABS
5.0000 mg | ORAL_TABLET | Freq: Once | ORAL | Status: AC
  Administered 2011-11-07: 5 mg via ORAL
  Filled 2011-11-07: qty 1

## 2011-11-07 NOTE — Progress Notes (Signed)
  Echocardiogram 2D Echocardiogram has been performed.  Shiva Sahagian L 11/07/2011, 9:28 AM

## 2011-11-07 NOTE — Clinical Documentation Improvement (Signed)
RENAL FAILURE DOCUMENTATION CLARIFICATION QUERY  THIS DOCUMENT IS NOT A PERMANENT PART OF THE MEDICAL RECORD   Please update your documentation within the medical record to reflect your response to this query.                                                                                     11/07/11  Dr. Janee Morn and/or Associates,  In a better effort to capture your patient's severity of illness, reflect appropriate length of stay and utilization of resources, a review of the patient medical record has revealed the following indicators:  Bun/Cr./GFR   Trend this admission 16/0.76/82 13/0.66/86 12/0.64/87 12/0.68/85 10/0.69/85 17/1.45/35 20/1.87/26 17/1.78/27 14/1.60/31  " ARI - possibly due to Vanco - D/C it as above and hydrate"    Kostantinos Tallman E  11/05/2011  "ARI - possibly due to Vanco - improving, continue hydration"  Avaiah Stempel E  11/06/2011   Based on your clinical judgment, please document in the progress notes and discharge summary if a condition below provides greater specificity regarding the patient's renal function this admission:   - Acute Kidney Injury   - Acute Renal Failure   - Other Condition   - Unable to Clinically Determine   In responding to this query please exercise your independent judgment.  The fact that a query is asked, does not imply that any particular answer is desired or expected.   Reviewed:  no additional documentation provided  Thank You,  Jerral Ralph  RN BSN Certified Clinical Documentation Specialist: Cell   [phone]  Health Information Management [place]   TO RESPOND TO THE THIS QUERY, FOLLOW THE INSTRUCTIONS BELOW:  1. If needed, update documentation for the patient's encounter via the notes activity.  2. Access this query again and click edit on the In [employer].  3. After updating, or not, click F2 to complete all highlighted (required) fields concerning your review. Select "additional  documentation in the medical record" OR "no additional documentation provided".  4. Click Sign note button.  5. The deficiency will fall out of your In Basket *Please let us know if you are not able to complete this workflow by phone or e-mail (listed below).

## 2011-11-07 NOTE — Progress Notes (Signed)
Physical Therapy Treatment Patient Details Name: [name] MRN: [id number] DOB: 10-19-1940 Today's Date: 11/07/2011  PT Assessment/Plan  PT - Assessment/Plan Comments on Treatment Session: Mrs. Arons did very well today, much improved from eval needing no physical assist for any mobility. Will have appropriate assist from her husband at home however surgery PA suggesting they may need SNF for assist with dressing changes. Per PA, this will be pt and husband's decision. May attempt using [facility] next visit because pt may not need as much support as RW gives her.  PT Plan: Frequency remains appropriate;Discharge plan needs to be updated Follow Up Recommendations: Home health PT;Supervision for mobility/OOB Equipment Recommended: 3 in 1 bedside comode PT Goals  Acute Rehab PT Goals Pt will go Supine/Side to Sit: Independently PT Goal: Supine/Side to Sit - Progress: Updated due to goal met PT Goal: Sit at Edge Of Bed - Progress: Met PT Goal: Sit to Stand - Progress: Progressing toward goal Pt will go Stand to Sit: with modified independence PT Goal: Stand to Sit - Progress: Updated due to goals met Pt will Transfer Bed to Chair/Chair to Bed: with modified independence PT Transfer Goal: Bed to Chair/Chair to Bed - Progress: Updated due to goal met PT Goal: Ambulate - Progress: Progressing toward goal  PT Treatment Precautions/Restrictions  Precautions Precautions: Fall Restrictions Weight Bearing Restrictions: No Mobility (including Balance) Bed Mobility Supine to Sit: 5: Supervision;HOB elevated (Comment degrees);With rails (45 degrees) Sitting - Scoot to Edge of Bed: 6: Modified independent (Device/Increase time) Transfers Sit to Stand:  (mingaurdA) Sit to Stand Details (indicate cue type and reason): stood with close gaurding and cues for safe hand placement Stand to Sit: 5: Supervision;With armrests;To chair/3-in-1;With upper extremity assist Stand to Sit Details: cues for  safe technique (pt sat x2 on 3in1 and recliner) with cues for hand placement and controlled descent; cues for safe management of RW during transfers Ambulation/Gait Ambulation/Gait: Yes Ambulation/Gait Assistance:  (mingaurdA) Ambulation/Gait Assistance Details (indicate cue type and reason): amb with slow gaurded gait; cues for safe management of RW especially in tight spaces pt attempting to take hands off RW and hold onto environmental supports; cues for turning wtih RW and maintaining feet within safe positioning Ambulation Distance (Feet): 200 Feet Assistive device: Rolling walker Gait Pattern: Step-through pattern;Trunk flexed;Shuffle  Static Sitting Balance Static Sitting - Balance Support: No upper extremity supported Static Sitting - Level of Assistance: 7: Independent Static Standing Balance Static Standing - Balance Support: No upper extremity supported Static Standing - Level of Assistance: 5: Stand by assistance Static Standing - Comment/# of Minutes: pt stood to wash hands and face at the sink x2 minutes with just SBA, able to weight shift and rotate trunk appropriately to reach; no LOB noted Exercise    End of Session PT - End of Session Equipment Utilized During Treatment: Gait belt Activity Tolerance: Patient tolerated treatment well Patient left: in chair;with call bell in reach General Behavior During Session: [facility] for tasks performed Cognition: [facility] for tasks performed  [facility] HELEN 11/07/2011, 10:57 AM

## 2011-11-07 NOTE — Progress Notes (Signed)
Patient ID: [name], female   DOB: Jun 27, 1941, 71 y.o.   MRN: [id number] 6 Days Post-Op  Subjective: Pt feels well.  Has 1 BM per day she says.  She's wanting to go home.  Objective: Vital signs in last 24 hours: Temp:  [97.8 F (36.6 C)-98.7 F (37.1 C)] 98 F (36.7 C) (04/10 0500) Pulse Rate:  [68-96] 71  (04/10 0500) Resp:  [18-20] 18  (04/10 0500) BP: (118-139)/(68-78) 126/78 mmHg (04/10 0500) SpO2:  [94 %-95 %] 94 % (04/10 0905) Last BM Date: 11/06/11  Intake/Output from previous day: 04/09 0701 - 04/10 0700 In: 1804 [P.O.:120; I.V.:1684] Out: 550 [Urine:550] Intake/Output this shift:    PE: GU: please see later note when dressing change occurs.  Lab Results:   Basename 11/07/11 0655 11/06/11 0615  WBC 25.0* 27.7*  HGB 9.4* 9.6*  HCT 27.9* 28.3*  PLT 514* 501*   BMET  Basename 11/07/11 0655 11/06/11 0615  NA 135 135  K 2.9* 3.5  CL 101 101  CO2 23 22  GLUCOSE 136* 139*  BUN 14 17  CREATININE 1.60* 1.78*  CALCIUM 8.2* 8.3*   PT/INR  Basename 11/07/11 0655 11/06/11 0615  LABPROT 20.1* 16.4*  INR 1.68* 1.30   CMP     Component Value Date/Time   NA 135 11/07/2011 0655   K 2.9* 11/07/2011 0655   CL 101 11/07/2011 0655   CO2 23 11/07/2011 0655   GLUCOSE 136* 11/07/2011 0655   BUN 14 11/07/2011 0655   CREATININE 1.60* 11/07/2011 0655   CALCIUM 8.2* 11/07/2011 0655   PROT 5.8* 11/06/2011 0615   ALBUMIN 1.6* 11/06/2011 0615   AST 55* 11/06/2011 0615   ALT 108* 11/06/2011 0615   ALKPHOS 174* 11/06/2011 0615   BILITOT 0.2* 11/06/2011 0615   GFRNONAA 31* 11/07/2011 0655   GFRAA 36* 11/07/2011 0655   Lipase  No results found for this basename: lipase       Studies/Results: No results found.  Anti-infectives: Anti-infectives     Start     Dose/Rate Route Frequency Ordered Stop   11/05/11 1200   cefTRIAXone (ROCEPHIN) 1 g in dextrose 5 % 50 mL IVPB        1 g 100 mL/hr over 30 Minutes Intravenous Every 24 hours 11/05/11 0926     11/02/11 0000    vancomycin (VANCOCIN) 1,250 mg in sodium chloride 0.9 % 250 mL IVPB  Status:  Discontinued        1,250 mg 166.7 mL/hr over 90 Minutes Intravenous Every 12 hours 11/01/11 1858 11/05/11 0057   11/01/11 2200   piperacillin-tazobactam (ZOSYN) IVPB 3.375 g  Status:  Discontinued        3.375 g 12.5 mL/hr over 240 Minutes Intravenous 3 times per day 11/01/11 1843 11/05/11 0926   11/01/11 1351  piperacillin-tazobactam (ZOSYN) 3-0.375 GM/50ML IVPB    Comments: KOHUT, NIKKI: cabinet override        11/01/11 1351 11/01/11 1400   11/01/11 1315   gentamicin (GARAMYCIN) 1.5 mg/kg in dextrose 5 % 50 mL IVPB  Status:  Discontinued        1.5 mg/kg 100 mL/hr over 30 Minutes Intravenous  Once 11/01/11 1310 11/01/11 1404   11/01/11 1130   vancomycin (VANCOCIN) IVPB 1000 mg/200 mL premix        1,000 mg 200 mL/hr over 60 Minutes Intravenous  Once 11/01/11 1127 11/01/11 1441   11/01/11 1130   cefTRIAXone (ROCEPHIN) 1 g in dextrose 5 % 50 mL IVPB  1 g 100 mL/hr over 30 Minutes Intravenous  Once 11/01/11 1127 11/01/11 1205           Assessment/Plan  1. Cellulitis of groin, s/p I&D  Plan: 1. Will look at wound with dressing change.  WBC back down to 25K.  D/w patient the need to talk to her husband about dispo preference of home vs SNF. 2. UA is negative. 3. Cont abx therapy  LOS: 6 days    Aracelis Ulrey E 11/07/2011

## 2011-11-07 NOTE — Progress Notes (Signed)
Hopefully pulse lavage will help, check labs in AM Patient examined and I agree with the assessment and plan  Violeta Gelinas, MD, MPH, FACS Pager: [phone]  11/07/2011 5:10 PM

## 2011-11-07 NOTE — Evaluation (Signed)
Occupational Therapy Evaluation Patient Details Name: [name] MRN: [id number] DOB: 1941-04-22 Today's Date: 11/07/2011  Problem List:  Patient Active Problem List  Diagnoses  . Hypertension  . Breast cancer  . Necrotizing fasciitis  . Hyperlipidemia  . Leukocytosis  . Sepsis  . Atrial fibrillation    Past Medical History:  Past Medical History  Diagnosis Date  . Hypertension   . Hyperlipidemia   . Cancer     breast cancer  . Anal stenosis   . Dysrhythmia     atrial fibrilation  . Sleep apnea     cpap  . Arthritis    Past Surgical History:  Past Surgical History  Procedure Date  . Anal dilation     multiple  . Abdominal hysterectomy   . Incision and drainage perirectal abscess 11/01/2011    Procedure: IRRIGATION AND DEBRIDEMENT PERIRECTAL ABSCESS;  Surgeon: Kandis Cocking, MD;  Location: [facility] OR;  Service: General;  Laterality: N/A;  Rigid Sigmiodoscopy ; Irrigation and Debridement left perineum and labia.  . Breast surgery     right mastectomy; left lumpectomy  . Joint replacement     right knee  . Colon surgery 1986    OT Assessment/Plan/Recommendation OT Assessment Clinical Impression Statement: Pleasant 71 yr old female admitted with necrotizing fasciatis of the labia and perineum.  Presents with slight impairment with basic selfcare tasks and functional perfromance of ADLs.  Will benefit from acute OT to address current deficits and increase overall independence with ADLs in order to return home with her husband at a modified independent level. OT Recommendation/Assessment: Patient will need skilled OT in the acute care venue OT Problem List: Decreased strength;Impaired balance (sitting and/or standing);Decreased knowledge of use of DME or AE;Pain OT Therapy Diagnosis : Generalized weakness;Acute pain OT Plan OT Frequency: Min 2X/week OT Treatment/Interventions: Self-care/ADL training;Therapeutic activities;DME and/or AE instruction;Balance  training;Patient/family education OT Recommendation Follow Up Recommendations: No OT follow up Equipment Recommended: 3 in 1 bedside comode Individuals Consulted Consulted and Agree with Results and Recommendations: Patient OT Goals Acute Rehab OT Goals OT Goal Formulation: With patient/family Time For Goal Achievement: 7 days ADL Goals Pt Will Perform Grooming: with modified independence;Standing at sink ADL Goal: Grooming - Progress: Goal set today Pt Will Perform Lower Body Bathing: with modified independence;Sit to stand from bed;Sit to stand from chair;with adaptive equipment ADL Goal: Lower Body Bathing - Progress: Goal set today Pt Will Perform Lower Body Dressing: with modified independence;Sit to stand from bed;Sit to stand from chair ADL Goal: Lower Body Dressing - Progress: Goal set today Pt Will Transfer to Toilet: with modified independence;3-in-1;with DME ADL Goal: Toilet Transfer - Progress: Goal set today Pt Will Perform Toileting - Clothing Manipulation: with modified independence;Sitting on 3-in-1 or toilet;Standing ADL Goal: Toileting - Clothing Manipulation - Progress: Goal set today Pt Will Perform Toileting - Hygiene: with modified independence;Sit to stand from 3-in-1/toilet ADL Goal: Toileting - Hygiene - Progress: Goal set today  OT Evaluation Precautions/Restrictions  Precautions Precautions: Fall Required Braces or Orthoses: No Restrictions Weight Bearing Restrictions: No Prior Functioning Home Living Lives With: Spouse Type of Home: House Home Layout: Able to live on main level with bedroom/bathroom;Two level Home Access: Stairs to enter Entrance Stairs-Rails: Left Entrance Stairs-Number of Steps: 2 Bathroom Shower/Tub: Health visitor: Handicapped height Home Adaptive Equipment: Straight cane;Walker - rolling Prior Function Level of Independence: Independent with basic ADLs;Independent with homemaking with ambulation Driving:  Yes Vocation: Retired ADL ADL Eating/Feeding: Simulated;Independent Where Assessed -  Eating/Feeding: Chair Grooming: Simulated;Minimal assistance (Min guard assist) Where Assessed - Grooming: Standing at sink Upper Body Bathing: Simulated;Set up Where Assessed - Upper Body Bathing: Supported;Sitting, chair Lower Body Bathing: Simulated;Minimal assistance Where Assessed - Lower Body Bathing: Sit to stand from chair Upper Body Dressing: Simulated;Set up Where Assessed - Upper Body Dressing: Sitting, chair;Supported Lower Body Dressing: Simulated;Moderate assistance Where Assessed - Lower Body Dressing: Sit to stand from chair Toilet Transfer: Performed;Minimal assistance Toilet Transfer Details (indicate cue type and reason): Hand held assist to handicapped toilet. Toilet Transfer Method: Proofreader: Comfort height toilet Toileting - Clothing Manipulation: Simulated;Minimal assistance Where Assessed - Glass blower/designer Manipulation: Sit to stand from 3-in-1 or toilet Toileting - Hygiene: Simulated;Minimal assistance Where Assessed - Toileting Hygiene: Sit to stand from 3-in-1 or toilet Ambulation Related to ADLs: Pt required min hand held assist to ambulate to the bathroom without any assistive device. ADL Comments: Pt with decreased ability to adequately reach her feet for simulated dressing tasks.  She was able to cross up the LLE over the right knee to donn her gripper sock, but unable to repeat the task on the right leg.  Discussed AE for LB selfcare, which pt reports seeing sometime in the past but not recently.  They plan to purchase fo use at home. Vision/Perception  Vision - History Baseline Vision: Wears glasses all the time Patient Visual Report: No change from baseline Perception Perception: Within Functional Limits Praxis Praxis: Intact Cognition Cognition Arousal/Alertness: Awake/alert Overall Cognitive Status: Appears within functional  limits for tasks assessed Orientation Level: Oriented X4 Sensation/Coordination Sensation Light Touch: Appears Intact Stereognosis: Appears Intact Hot/Cold: Appears Intact Proprioception: Appears Intact Extremity Assessment RUE Assessment RUE Assessment: Within Functional Limits LUE Assessment LUE Assessment: Within Functional Limits Mobility  Bed Mobility Bed Mobility: No Supine to Sit: 5: Supervision;HOB elevated (Comment degrees);With rails (45 degrees) Sitting - Scoot to Edge of Bed: 6: Modified independent (Device/Increase time) Transfers Transfers: Yes Sit to Stand: 4: Min assist;From toilet;Without upper extremity assist Sit to Stand Details (indicate cue type and reason): stood with close gaurding and cues for safe hand placement Stand to Sit: 4: Min assist;With upper extremity assist;To toilet Stand to Sit Details: cues for safe technique (pt sat x2 on 3in1 and recliner) with cues for hand placement and controlled descent; cues for safe management of RW during transfers   End of Session OT - End of Session Activity Tolerance: Patient tolerated treatment well Patient left: in chair;with call bell in reach;with family/visitor present General Behavior During Session: [facility] for tasks performed Cognition: [facility] for tasks performed   [facility] 11/07/2011, 11:58 AM

## 2011-11-07 NOTE — Progress Notes (Signed)
Patient Name: [name] Date of Encounter: 11/07/2011    SUBJECTIVE:Feels okay and no CV complaints.  TELEMETRY:  none: Filed Vitals:   11/06/11 1717 11/06/11 2153 11/06/11 2154 11/07/11 0500  BP: 118/78 139/68 139/68 126/78  Pulse: 89 96 83 71  Temp: 98.7 F (37.1 C)  97.8 F (36.6 C) 98 F (36.7 C)  TempSrc:   Oral Oral  Resp: 18  18 18   Height:      Weight:      SpO2: 94%  94% 95%    Intake/Output Summary (Last 24 hours) at 11/07/11 0842 Last data filed at 11/07/11 0600  Gross per 24 hour  Intake   1804 ml  Output    550 ml  Net   1254 ml    LABS: Basic Metabolic Panel:  Basename 11/07/11 0655 11/06/11 0615  NA 135 135  K 2.9* 3.5  CL 101 101  CO2 23 22  GLUCOSE 136* 139*  BUN 14 17  CREATININE 1.60* 1.78*  CALCIUM 8.2* 8.3*  MG -- --  PHOS -- --   CBC:  Basename 11/07/11 0655 11/06/11 0615  WBC 25.0* 27.7*  NEUTROABS -- --  HGB 9.4* 9.6*  HCT 27.9* 28.3*  MCV 87.5 87.3  PLT 514* 501*   BNP: > 4000 TSH: > 12 ECHOCARDIOGRAM: Not yet done  Radiology/Studies:  none  Physical Exam: Blood pressure 126/78, pulse 71, temperature 98 F (36.7 C), temperature source Oral, resp. rate 18, height 5\' 7"  (1.702 m), weight 93.5 kg (206 lb 2.1 oz), SpO2 95.00%. Weight change:    Irregularly irregulay  ASSESSMENT:  1. A fib with controlled rate.  2. Acute diastolic heart failure presumed, based on BNP and presence of a fib.  3. Possible hypothyroid state vs Euthyroid Sick syndrome   Plan:   1. Be careful with fluid administration 2. Await echo 3. Continue current therapy  Signed, Lesleigh Noe 11/07/2011, 8:42 AM

## 2011-11-07 NOTE — Progress Notes (Signed)
ANTICOAGULATION CONSULT NOTE - Follow Up Consult  Pharmacy Consult for Coumadin Indication: atrial fibrillation  Allergies  Allergen Reactions  . Diclofenac Itching    Feels difficult to swallow, but no tongue swelling or SOB    Patient Measurements: Height: 5\' 7"  (170.2 cm) Weight: 206 lb 2.1 oz (93.5 kg) IBW/kg (Calculated) : 61.6   Vital Signs: Temp: 98 F (36.7 C) (04/10 0500) Temp src: Oral (04/10 0500) BP: 126/78 mmHg (04/10 0500) Pulse Rate: 98  (04/10 1105)  Labs:  Basename 11/07/11 0655 11/06/11 0615 11/05/11 0620  HGB 9.4* 9.6* --  HCT 27.9* 28.3* --  PLT 514* 501* --  APTT -- -- --  LABPROT 20.1* 16.4* 15.6*  INR 1.68* 1.30 1.21  HEPARINUNFRC -- -- --  CREATININE 1.60* 1.78* 1.87*  CKTOTAL -- -- --  CKMB -- -- --  TROPONINI -- -- --   Estimated Creatinine Clearance: 37.3 ml/min (by C-G formula based on Cr of 1.6).  Assessment: 71 y.o. F on warfarin for hx Afib and also on Lovenox 40 SQ every 24 hours. INR remains SUBtherapeutic today (INR 1.68 << 1.3, goal of 2-30). Hgb/Hct/Plt ok, no s/sx of bleeding noted. LFTs noted to be increased on 4/8 labs which can increase warfarin sensitivity. Consider full-dose lovenox for bridging while INR <2 and patient in active Afib.     Goal of Therapy:  INR 2-3   Plan:  1. Warfarin 5 mg x 1 at 1800 today 2. Consider full-dose lovenox for bridging while INR <2 and patient in active Afib.  3. Will continue to monitor for any signs/symptoms of bleeding and will follow up with PT/INR in the a.m.   Georgina Pillion, PharmD, BCPS Clinical Pharmacist Pager: [phone] 11/07/2011 1:12 PM

## 2011-11-08 LAB — CULTURE, BLOOD (ROUTINE X 2)
Culture  Setup Time: 201304050128
Culture: NO GROWTH

## 2011-11-08 LAB — CBC
HCT: 29.4 % — ABNORMAL LOW (ref 36.0–46.0)
MCHC: 33 g/dL (ref 30.0–36.0)
MCV: 88.3 fL (ref 78.0–100.0)
Platelets: 555 10*3/uL — ABNORMAL HIGH (ref 150–400)
RDW: 14.2 % (ref 11.5–15.5)

## 2011-11-08 LAB — ANAEROBIC CULTURE

## 2011-11-08 LAB — PROTIME-INR: INR: 1.96 — ABNORMAL HIGH (ref 0.00–1.49)

## 2011-11-08 MED ORDER — HYDROCHLOROTHIAZIDE 25 MG PO TABS
25.0000 mg | ORAL_TABLET | Freq: Every day | ORAL | Status: DC
  Administered 2011-11-08 – 2011-11-12 (×5): 25 mg via ORAL
  Filled 2011-11-08 (×6): qty 1

## 2011-11-08 MED ORDER — ATORVASTATIN CALCIUM 40 MG PO TABS
40.0000 mg | ORAL_TABLET | Freq: Every day | ORAL | Status: DC
  Administered 2011-11-08 – 2011-11-12 (×5): 40 mg via ORAL
  Filled 2011-11-08 (×5): qty 1

## 2011-11-08 MED ORDER — AMLODIPINE BESYLATE 5 MG PO TABS
5.0000 mg | ORAL_TABLET | Freq: Every day | ORAL | Status: DC
  Administered 2011-11-08 – 2011-11-12 (×5): 5 mg via ORAL
  Filled 2011-11-08 (×6): qty 1

## 2011-11-08 MED ORDER — LEVOTHYROXINE SODIUM 25 MCG PO TABS
25.0000 ug | ORAL_TABLET | Freq: Every day | ORAL | Status: DC
  Administered 2011-11-09 – 2011-11-12 (×4): 25 ug via ORAL
  Filled 2011-11-08 (×5): qty 1

## 2011-11-08 MED ORDER — WARFARIN SODIUM 5 MG PO TABS
5.0000 mg | ORAL_TABLET | Freq: Once | ORAL | Status: AC
  Administered 2011-11-08: 5 mg via ORAL
  Filled 2011-11-08: qty 1

## 2011-11-08 NOTE — Progress Notes (Signed)
Appreciate PT pulse lavage I hope WBC will continue to drop I spoke to the patient's husband Patient examined and I agree with the assessment and plan  Violeta Gelinas, MD, MPH, FACS Pager: [phone]  11/08/2011 6:05 PM

## 2011-11-08 NOTE — Progress Notes (Addendum)
Patient Name: [name] Date of Encounter: 11/08/2011    SUBJECTIVE:No dyspnea or CV complaints.  TELEMETRY:   Not on tele: Filed Vitals:   11/07/11 1105 11/07/11 1426 11/07/11 2246 11/08/11 0507  BP:  133/76 128/70 130/54  Pulse: 98 68 76 82  Temp:  97.9 F (36.6 C) 98.2 F (36.8 C) 98 F (36.7 C)  TempSrc:  Oral Oral Oral  Resp:  17 16 18   Height:      Weight:      SpO2: 98% 98% 95% 92%    Intake/Output Summary (Last 24 hours) at 11/08/11 1358 Last data filed at 11/08/11 0518  Gross per 24 hour  Intake   1495 ml  Output      0 ml  Net   1495 ml    LABS: Basic Metabolic Panel:  Basename 11/07/11 0655 11/06/11 0615  NA 135 135  K 2.9* 3.5  CL 101 101  CO2 23 22  GLUCOSE 136* 139*  BUN 14 17  CREATININE 1.60* 1.78*  CALCIUM 8.2* 8.3*  MG -- --  PHOS -- --   CBC:  Basename 11/08/11 0635 11/07/11 0655  WBC 21.8* 25.0*  NEUTROABS -- --  HGB 9.7* 9.4*  HCT 29.4* 27.9*  MCV 88.3 87.5  PLT 555* 514*   TSH and Thyroid studies: consistent with hypothyroidism  Radiology/Studies:  No new   ECHO: LV EF 55%, Mild LAE, RAE, elevated RVSP 42 mmHg  Physical Exam: Blood pressure 130/54, pulse 82, temperature 98 F (36.7 C), temperature source Oral, resp. rate 18, height 5\' 7"  (1.702 m), weight 93.5 kg (206 lb 2.1 oz), SpO2 92.00%. Weight change:    Irregularly irregular. No murmur or gallop.  ASSESSMENT:  1. A fib with controlled rate.  2. Hypothyroidism  3. Chronic anticoagulation with coumadin  4. Diastolic heart failure, asymptomatic.   Plan:  1. Levoxyl 25 micrograms po daily.  2. INR goal 2.0  3. Awaiting skilled nursing facility  4. Agree with reducing and or stopping IV fluid  Signed, Lesleigh Noe 11/08/2011, 1:58 PM

## 2011-11-08 NOTE — Progress Notes (Signed)
Patient ID: [name], female   DOB: January 07, 1941, 71 y.o.   MRN: [id number] 7 Days Post-Op  Subjective: Pt feeling good today.  Sitting up in chair.  Objective: Vital signs in last 24 hours: Temp:  [97.9 F (36.6 C)-98.2 F (36.8 C)] 98 F (36.7 C) (04/11 0507) Pulse Rate:  [68-98] 82  (04/11 0507) Resp:  [16-18] 18  (04/11 0507) BP: (128-133)/(54-76) 130/54 mmHg (04/11 0507) SpO2:  [92 %-98 %] 92 % (04/11 0507) Last BM Date: 11/07/11  Intake/Output from previous day: 04/10 0701 - 04/11 0700 In: 1495 [I.V.:1495] Out: -  Intake/Output this shift:    PE: GU: evaluation of wound yesterday reveals some dead fat in the superior portion of the wound.  Some granulation tissue is noted towards the middle section of the wound.  Overall the wound is relatively clean with no other pockets or necrotic tissue noted.  Lab Results:   Basename 11/08/11 0635 11/07/11 0655  WBC 21.8* 25.0*  HGB 9.7* 9.4*  HCT 29.4* 27.9*  PLT 555* 514*   BMET  Basename 11/07/11 0655 11/06/11 0615  NA 135 135  K 2.9* 3.5  CL 101 101  CO2 23 22  GLUCOSE 136* 139*  BUN 14 17  CREATININE 1.60* 1.78*  CALCIUM 8.2* 8.3*   PT/INR  Basename 11/08/11 0635 11/07/11 0655  LABPROT 22.7* 20.1*  INR 1.96* 1.68*   CMP     Component Value Date/Time   NA 135 11/07/2011 0655   K 2.9* 11/07/2011 0655   CL 101 11/07/2011 0655   CO2 23 11/07/2011 0655   GLUCOSE 136* 11/07/2011 0655   BUN 14 11/07/2011 0655   CREATININE 1.60* 11/07/2011 0655   CALCIUM 8.2* 11/07/2011 0655   PROT 5.8* 11/06/2011 0615   ALBUMIN 1.6* 11/06/2011 0615   AST 55* 11/06/2011 0615   ALT 108* 11/06/2011 0615   ALKPHOS 174* 11/06/2011 0615   BILITOT 0.2* 11/06/2011 0615   GFRNONAA 31* 11/07/2011 0655   GFRAA 36* 11/07/2011 0655   Lipase  No results found for this basename: lipase       Studies/Results: No results found.  Anti-infectives: Anti-infectives     Start     Dose/Rate Route Frequency Ordered Stop   11/05/11 1200    cefTRIAXone (ROCEPHIN) 1 g in dextrose 5 % 50 mL IVPB        1 g 100 mL/hr over 30 Minutes Intravenous Every 24 hours 11/05/11 0926     11/02/11 0000   vancomycin (VANCOCIN) 1,250 mg in sodium chloride 0.9 % 250 mL IVPB  Status:  Discontinued        1,250 mg 166.7 mL/hr over 90 Minutes Intravenous Every 12 hours 11/01/11 1858 11/05/11 0057   11/01/11 2200   piperacillin-tazobactam (ZOSYN) IVPB 3.375 g  Status:  Discontinued        3.375 g 12.5 mL/hr over 240 Minutes Intravenous 3 times per day 11/01/11 1843 11/05/11 0926   11/01/11 1351  piperacillin-tazobactam (ZOSYN) 3-0.375 GM/50ML IVPB    Comments: KOHUT, NIKKI: cabinet override        11/01/11 1351 11/01/11 1400   11/01/11 1315   gentamicin (GARAMYCIN) 1.5 mg/kg in dextrose 5 % 50 mL IVPB  Status:  Discontinued        1.5 mg/kg 100 mL/hr over 30 Minutes Intravenous  Once 11/01/11 1310 11/01/11 1404   11/01/11 1130   vancomycin (VANCOCIN) IVPB 1000 mg/200 mL premix        1,000 mg 200 mL/hr over  60 Minutes Intravenous  Once 11/01/11 1127 11/01/11 1441   11/01/11 1130   cefTRIAXone (ROCEPHIN) 1 g in dextrose 5 % 50 mL IVPB        1 g 100 mL/hr over 30 Minutes Intravenous  Once 11/01/11 1127 11/01/11 1205           Assessment/Plan  1. Necrotizing soft tissue infection, s/p I&D 2. Leukocytosis, improving 3. New onset A. Fib  Plan: 1. Restart some home meds 2. Start hydrotherapy to wound today to clean up superior aspect. 3. Follow WBC, improving  4. Follow Cr. 5. Patient has decided on SNF for wound care  LOS: 7 days    Henreitta Spittler E 11/08/2011

## 2011-11-08 NOTE — Progress Notes (Signed)
Clinical Social Work Department CLINICAL SOCIAL WORK PLACEMENT NOTE 11/08/2011  Patient:  [name], [name]  Account Number:  [id number] Admit date:  11/01/2011  Clinical Social Worker:  Margaree Mackintosh  Date/time:  11/08/2011 12:00 M  Clinical Social Work is seeking post-discharge placement for this patient at the following level of care:   SKILLED NURSING   (*CSW will update this form in Epic as items are completed)   11/08/2011  Patient/family provided with [facility] Department of Clinical Social Work's list of facilities offering this level of care within the geographic area requested by the patient (or if unable, by the patient's family).  11/08/2011  Patient/family informed of their freedom to choose among providers that offer the needed level of care, that participate in Medicare, Medicaid or managed care program needed by the patient, have an available bed and are willing to accept the patient.  11/08/2011  Patient/family informed of [facility]' ownership interest in [facility], as well as of the fact that they are under no obligation to receive care at this facility.  PASARR submitted to EDS on 11/08/2011 PASARR number received from EDS on   FL2 transmitted to all facilities in geographic area requested by pt/family on  11/08/2011 FL2 transmitted to all facilities within larger geographic area on   Patient informed that his/her managed care company has contracts with or will negotiate with  certain facilities, including the following:     Patient/family informed of bed offers received:  11/08/2011 Patient chooses bed at  Physician recommends and patient chooses bed at    Patient to be transferred to  on   Patient to be transferred to facility by   The following physician request were entered in Epic:   Additional Comments: pt prefers [facility].    Angelia Mould, MSW, [place] [phone]

## 2011-11-08 NOTE — Progress Notes (Signed)
Clinical Social Work Department BRIEF PSYCHOSOCIAL ASSESSMENT 11/08/2011  Patient:  [name], [name]     Account Number:  [id number]     Admit date:  11/01/2011  Clinical Social Worker:  Margaree Mackintosh  Date/Time:  11/08/2011 12:00 M  Referred by:  Physician  Date Referred:  11/07/2011 Referred for  SNF Placement   Other Referral:   Interview type:  Patient Other interview type:   with spouse present.    PSYCHOSOCIAL DATA Living Status:  FAMILY Admitted from facility:   Level of care:   Primary support name:  [phone] Primary support relationship to patient:  SPOUSE Degree of support available:   Adequate.    CURRENT CONCERNS Current Concerns  Post-Acute Placement   Other Concerns:    SOCIAL WORK ASSESSMENT / PLAN Clinical Social Worker met with pt and spouse at bedside; pt consented to speaking openly.  CSW introduced self and explained role.  CSW reviewed questions/concerns related to SNF process.  CSW reviewed bed offer list.  CSW to continue to follow and assist as needed.    Of note; pt mentioned her first choice is [place], CSW left message with [facility] SNF indicating pt's preference.   Assessment/plan status:  Information/Referral to [facility] Other assessment/ plan:   Information/referral to community resources:   SNF list    PATIENT'S/FAMILY'S RESPONSE TO PLAN OF CARE: Pt and spouse were receptive to intervention.        Angelia Mould, MSW, [place] [phone]

## 2011-11-08 NOTE — Progress Notes (Signed)
ANTICOAGULATION CONSULT NOTE - Follow Up Consult  Pharmacy Consult for Coumadin Indication: atrial fibrillation  Allergies  Allergen Reactions  . Diclofenac Itching    Feels difficult to swallow, but no tongue swelling or SOB    Patient Measurements: Height: 5\' 7"  (170.2 cm) Weight: 206 lb 2.1 oz (93.5 kg) IBW/kg (Calculated) : 61.6   Vital Signs: Temp: 98 F (36.7 C) (04/11 0507) Temp src: Oral (04/11 0507) BP: 130/54 mmHg (04/11 0507) Pulse Rate: 82  (04/11 0507)  Labs:  Basename 11/08/11 8295 11/07/11 0655 11/06/11 0615  HGB 9.7* 9.4* --  HCT 29.4* 27.9* 28.3*  PLT 555* 514* 501*  APTT -- -- --  LABPROT 22.7* 20.1* 16.4*  INR 1.96* 1.68* 1.30  HEPARINUNFRC -- -- --  CREATININE -- 1.60* 1.78*  CKTOTAL -- -- --  CKMB -- -- --  TROPONINI -- -- --   Estimated Creatinine Clearance: 37.3 ml/min (by C-G formula based on Cr of 1.6).  Assessment:Admit Complaint: Pain, rash --> necrotizing fasciitis of labia/perineum/thigh  Anticoag: 71 y.o. F on warfarin for hx Afib and also on Lovenox 40 SQ every 24 hours. INR almost to goal of 2-3. Hgb/Hct/Plt ok, no s/sx of bleeding noted. LFTs noted to be increased on 4/8 labs which can increase warfarin sensitivity. Cover with Lovenox 40mg /d.  Infectious Disease:CTX #4 (s/p Vanco/Zosyn).Afebrile, WBC 21.8. SCr 1.6.  -Cx Data: 4/4 Wound x2: Moderate Strep Group F 4/4 Blood x2: NGTD  Cardiovascular: Hx HTN/DL and new onset Afib in the setting of acute illness. Meds: Norvasc, Lipitor, HCTZ, lopressor  Neurology: On prn morphine (PTA paxil) - Paxil resumed  GI:  Off home statin, LFTs still elevated  Nephrology: Scr trending back downward 1.6 now.  Hematology / Oncology: Hx breast CA s/p R mastectomy + L lumpectomy. Last CBC 4/5.     Goal of Therapy:  INR 2-3   Plan:  1. Warfarin 5 mg x 1 at 1800 today  Christelle Igoe S. Merilynn Finland, PharmD, [facility] Clinical Staff Pharmacist Pager [phone]  11/08/2011 1:06 PM

## 2011-11-08 NOTE — Progress Notes (Signed)
Physical Therapy Wound Treatment Patient Details  Name: HEELA HEISHMAN MRN: [id number] Date of Birth: [birth year]  Today's Date: 11/08/2011 Time: 1000-1040 Time Calculation (min): 40 min  Subjective  Subjective: Pt stated she didn't want any pain medication. Patient and Family Stated Goals: Wound to heal Date of Onset:  (8-10 days ago) Prior Treatments: Surgical debridement  Pain Score:    Wound Assessment  Wound 11/08/11 Other (Comment) Perineum Left Open surgical incision (Active)  Site / Wound Assessment Red;Pink;Yellow;Brown 11/08/2011  1:23 PM  % Wound base Red or Granulating 50% 11/08/2011  1:23 PM  % Wound base Yellow 45% 11/08/2011  1:23 PM  % Wound base Other (Comment) 5% 11/08/2011  1:23 PM  Peri-wound Assessment Other (Comment) 11/08/2011  1:23 PM  Wound Length (cm) 22 cm 11/08/2011  1:23 PM  Wound Width (cm) 4 cm 11/08/2011  1:23 PM  Wound Depth (cm) 5 cm 11/08/2011  1:23 PM  Margins Unattacted edges (unapproximated) 11/08/2011  1:23 PM  Drainage Amount Moderate 11/08/2011  1:23 PM  Drainage Description Serosanguineous 11/08/2011  1:23 PM  Treatment Hydrotherapy (Pulse lavage);Debridement (Selective);Packing (Saline gauze) 11/08/2011  1:23 PM  Dressing Type Moist to dry;Gauze (Comment);ABD;Mesh briefs 11/08/2011  1:23 PM  Dressing Changed Changed 11/08/2011  1:23 PM  Dressing Status Clean 11/08/2011  1:23 PM  Hydrotherapy Pulsed lavage therapy - wound location: lt perineum Pulsed Lavage with Suction (psi): 8 psi Pulsed Lavage with Suction - Normal Saline Used: 1000 mL Pulsed Lavage Tip: Tip with splash shield Selective Debridement Selective Debridement - Location: lt perineum Selective Debridement - Tools Used: Forceps;Scissors Selective Debridement - Tissue Removed: brown   Wound Assessment and Plan  Wound Therapy - Assess/Plan/Recommendations Wound Therapy - Clinical Statement: Pt presents to hydrotherapy with open perineal wound after I&D for necrotizing fasciitis.  Pt  can benefit from hydrotherapy to decr necrotic tissue and cleanse wound. Wound Therapy - Functional Problem List: Decr skin integrity Factors Delaying/Impairing Wound Healing: Infection - systemic/local Hydrotherapy Plan: Debridement;Dressing change;Patient/family education;Pulsatile lavage with suction Wound Therapy - Frequency: 6X / week Wound Therapy - Follow Up Recommendations: Skilled nursing facility  Wound Therapy Goals- Improve the function of patient's integumentary system by progressing the wound(s) through the phases of wound healing (inflammation - proliferation - remodeling) by: Decrease Necrotic Tissue to: 25% Decrease Necrotic Tissue - Progress: Goal set today Increase Granulation Tissue to: 75% Increase Granulation Tissue - Progress: Goal set today Goals/treatment plan/discharge plan were made with and agreed upon by patient/family: Yes Time For Goal Achievement: 7 days Wound Therapy - Potential for Goals: Good  Goals will be updated until maximal potential achieved or discharge criteria met.  Discharge criteria: when goals achieved, discharge from hospital, MD decision/surgical intervention, no progress towards goals, refusal/missing three consecutive treatments without notification or medical reason.  Ilissa Rosner 11/08/2011, 1:35 PM  [employer] PT [phone]

## 2011-11-09 ENCOUNTER — Inpatient Hospital Stay (HOSPITAL_COMMUNITY): Payer: Medicare Other

## 2011-11-09 LAB — BASIC METABOLIC PANEL
BUN: 14 mg/dL (ref 6–23)
Creatinine, Ser: 1.59 mg/dL — ABNORMAL HIGH (ref 0.50–1.10)
GFR calc Af Amer: 36 mL/min — ABNORMAL LOW (ref >90)
GFR calc non Af Amer: 31 mL/min — ABNORMAL LOW (ref >90)

## 2011-11-09 LAB — CBC
HCT: 27.1 % — ABNORMAL LOW (ref 36.0–46.0)
MCHC: 32.8 g/dL (ref 30.0–36.0)
MCV: 87.4 fL (ref 78.0–100.0)
Platelets: 611 10*3/uL — ABNORMAL HIGH (ref 150–400)
RDW: 14.4 % (ref 11.5–15.5)

## 2011-11-09 LAB — PROTIME-INR: INR: 2 — ABNORMAL HIGH (ref 0.00–1.49)

## 2011-11-09 MED ORDER — NYSTATIN 100000 UNIT/GM EX POWD
Freq: Three times a day (TID) | CUTANEOUS | Status: DC
  Administered 2011-11-09 – 2011-11-12 (×10): via TOPICAL
  Filled 2011-11-09: qty 15

## 2011-11-09 MED ORDER — WARFARIN SODIUM 5 MG PO TABS
5.0000 mg | ORAL_TABLET | Freq: Once | ORAL | Status: AC
  Administered 2011-11-09: 5 mg via ORAL
  Filled 2011-11-09: qty 1

## 2011-11-09 MED ORDER — IOHEXOL 300 MG/ML  SOLN
20.0000 mL | INTRAMUSCULAR | Status: AC
  Administered 2011-11-09 (×2): 20 mL via ORAL

## 2011-11-09 MED ORDER — DARIFENACIN HYDROBROMIDE ER 7.5 MG PO TB24
7.5000 mg | ORAL_TABLET | Freq: Every day | ORAL | Status: DC
  Administered 2011-11-09 – 2011-11-11 (×3): 7.5 mg via ORAL
  Filled 2011-11-09 (×4): qty 1

## 2011-11-09 NOTE — Progress Notes (Signed)
Patient ID: [name], female   DOB: 02-26-1941, 71 y.o.   MRN: [id number] 8 Days Post-Op  Subjective: Pt feels ok.  No complaints  Objective: Vital signs in last 24 hours: Temp:  [97.8 F (36.6 C)-98.4 F (36.9 C)] 98.3 F (36.8 C) (04/12 0931) Pulse Rate:  [70-80] 80  (04/12 0931) Resp:  [16-19] 16  (04/12 0931) BP: (116-137)/(55-68) 116/68 mmHg (04/12 0931) SpO2:  [96 %-98 %] 97 % (04/12 0931) Last BM Date: 11/08/11  Intake/Output from previous day: 04/11 0701 - 04/12 0700 In: 2116.2 [P.O.:720; I.V.:1346.2; IV Piggyback:50] Out: -  Intake/Output this shift: Total I/O In: 360 [P.O.:360] Out: -   PE: GU: wound is actually looking much better even after 2 sessions of hydrotherapy.  The superior aspect that had some necrotic fat is cleaning up nicely.  She has good granulation tissue at the inferior aspect and middle aspect of her wound.  Her skin is erythematous but secondary to a candida.  Lab Results:   Basename 11/09/11 0505 11/08/11 0635  WBC 22.8* 21.8*  HGB 8.9* 9.7*  HCT 27.1* 29.4*  PLT 611* 555*   BMET  Basename 11/09/11 0505 11/07/11 0655  NA 140 135  K 3.1* 2.9*  CL 105 101  CO2 24 23  GLUCOSE 101* 136*  BUN 14 14  CREATININE 1.59* 1.60*  CALCIUM 8.3* 8.2*   PT/INR  Basename 11/09/11 0505 11/08/11 0635  LABPROT 23.0* 22.7*  INR 2.00* 1.96*   CMP     Component Value Date/Time   NA 140 11/09/2011 0505   K 3.1* 11/09/2011 0505   CL 105 11/09/2011 0505   CO2 24 11/09/2011 0505   GLUCOSE 101* 11/09/2011 0505   BUN 14 11/09/2011 0505   CREATININE 1.59* 11/09/2011 0505   CALCIUM 8.3* 11/09/2011 0505   PROT 5.8* 11/06/2011 0615   ALBUMIN 1.6* 11/06/2011 0615   AST 55* 11/06/2011 0615   ALT 108* 11/06/2011 0615   ALKPHOS 174* 11/06/2011 0615   BILITOT 0.2* 11/06/2011 0615   GFRNONAA 31* 11/09/2011 0505   GFRAA 36* 11/09/2011 0505   Lipase  No results found for this basename: lipase       Studies/Results: No results  found.  Anti-infectives: Anti-infectives     Start     Dose/Rate Route Frequency Ordered Stop   11/05/11 1200   cefTRIAXone (ROCEPHIN) 1 g in dextrose 5 % 50 mL IVPB        1 g 100 mL/hr over 30 Minutes Intravenous Every 24 hours 11/05/11 0926     11/02/11 0000   vancomycin (VANCOCIN) 1,250 mg in sodium chloride 0.9 % 250 mL IVPB  Status:  Discontinued        1,250 mg 166.7 mL/hr over 90 Minutes Intravenous Every 12 hours 11/01/11 1858 11/05/11 0057   11/01/11 2200   piperacillin-tazobactam (ZOSYN) IVPB 3.375 g  Status:  Discontinued        3.375 g 12.5 mL/hr over 240 Minutes Intravenous 3 times per day 11/01/11 1843 11/05/11 0926   11/01/11 1351  piperacillin-tazobactam (ZOSYN) 3-0.375 GM/50ML IVPB    Comments: KOHUT, NIKKI: cabinet override        11/01/11 1351 11/01/11 1400   11/01/11 1315   gentamicin (GARAMYCIN) 1.5 mg/kg in dextrose 5 % 50 mL IVPB  Status:  Discontinued        1.5 mg/kg 100 mL/hr over 30 Minutes Intravenous  Once 11/01/11 1310 11/01/11 1404   11/01/11 1130   vancomycin (VANCOCIN) IVPB 1000 mg/200  mL premix        1,000 mg 200 mL/hr over 60 Minutes Intravenous  Once 11/01/11 1127 11/01/11 1441   11/01/11 1130   cefTRIAXone (ROCEPHIN) 1 g in dextrose 5 % 50 mL IVPB        1 g 100 mL/hr over 30 Minutes Intravenous  Once 11/01/11 1127 11/01/11 1205           Assessment/Plan  1. Necrotizing soft tissue infection, s/p I&D 2. leukocytosis 3. New onset a fib 4. Hypothyroidism 5. candida  Plan: 1. WBC increased again, will obtain CT of pelvis to evaluate and make sure we are not missing infection that we can't see clinically 2. On coumadin for a fib 3. Dr. Katrinka Blazing started the patient on low dose synthroid for hypothyroidism. 4. Cont antifungal powder to GU area.   LOS: 8 days    Doylene Splinter E 11/09/2011

## 2011-11-09 NOTE — Progress Notes (Signed)
ANTICOAGULATION CONSULT NOTE - Follow Up Consult  Pharmacy Consult for Coumadin Indication: atrial fibrillation  Allergies  Allergen Reactions  . Diclofenac Itching    Feels difficult to swallow, but no tongue swelling or SOB    Patient Measurements: Height: 5\' 7"  (170.2 cm) Weight: 206 lb 2.1 oz (93.5 kg) IBW/kg (Calculated) : 61.6   Vital Signs: Temp: 98.1 F (36.7 C) (04/12 0610) BP: 129/56 mmHg (04/12 0610) Pulse Rate: 70  (04/12 0610)  Labs:  Basename 11/09/11 0505 11/08/11 0635 11/07/11 0655  HGB 8.9* 9.7* --  HCT 27.1* 29.4* 27.9*  PLT 611* 555* 514*  APTT -- -- --  LABPROT 23.0* 22.7* 20.1*  INR 2.00* 1.96* 1.68*  HEPARINUNFRC -- -- --  CREATININE 1.59* -- 1.60*  CKTOTAL -- -- --  CKMB -- -- --  TROPONINI -- -- --   Estimated Creatinine Clearance: 37.6 ml/min (by C-G formula based on Cr of 1.59).  Assessment:  Admit Complaint: Pain, rash --> necrotizing fasciitis of labia /perineum/thigh  Anticoag: 71 y.o. F on warfarin for hx Afib and also on Lovenox 40 SQ every 24 hours. INR up to 2 today. Hgbdown to 8.9, no s/sx of bleeding noted. LFTs noted to be increased on 4/8 labs which can increase warfarin sensitivity. Can d/c Lovenox today.  Infectious Disease:CTX #5 (s/p Vanco/Zosyn).Afebrile, WBC up 22.8. SCr 1.59.  -Cx Data: 4/4 Wound x2: Moderate Strep Group F 4/4 Blood x2: NGTD  Cardiovascular: Hx HTN/DL and new onset Afib in the setting of acute illness. Meds: Norvasc, Lipitor, HCTZ, lopressor  Neurology: On prn morphine (PTA paxil) - Paxil resumed  Endo: Home Synthroid  Nephrology: Scr trending back downward 1.6 now.  Hematology / Oncology: Hx breast CA s/p R mastectomy + L lumpectomy. Last CBC 4/5.    Goal of Therapy:  INR 2-3   Plan:  1. Warfarin 5 mg x 1 at 1800 today 2. D/c Lovenox 3. Awaiting SNF.  Shiloh Swopes S. Merilynn Finland, PharmD, [facility] Clinical Staff Pharmacist Pager [phone]  11/09/2011 9:23 AM

## 2011-11-09 NOTE — Progress Notes (Signed)
CT pelvis improved & without pelvic abscess Patient examined and I agree with the assessment and plan  Violeta Gelinas, MD, MPH, FACS Pager: [phone]  11/09/2011 6:01 PM

## 2011-11-09 NOTE — Progress Notes (Signed)
Appointment made for follow-up on MAY 8th. Already placed on discharge instructions. Please call if questions.

## 2011-11-09 NOTE — Progress Notes (Signed)
Hydrotherapy Treatment Note   11/09/11 1035  Subjective Assessment  Subjective Husband asking about healing process.  Evaluation and Treatment  Evaluation and Treatment Procedures Explained to Patient/Family Yes  Evaluation and Treatment Procedures agreed to  Wound 11/08/11 Other (Comment) Perineum Left Open surgical incision  Date First Assessed/Time First Assessed: 11/08/11 1000   Wound Type: (c) Other (Comment)  Location: Perineum  Location Orientation: Left  Wound Description (Comments): Open surgical incision  Site / Wound Assessment Red;Pink;Yellow;Brown  % Wound base Red or Granulating 50%  % Wound base Yellow 45%  % Wound base Other (Comment) 5% (brown)  Treatment Hydrotherapy (Pulse lavage);Packing (Saline gauze);Debridement (Selective)  Dressing Type Moist to dry;Gauze (Comment);ABD;Mesh briefs  Dressing Changed Changed  Dressing Status Clean  Hydrotherapy  Pulsed Lavage with Suction (psi) 8 psi  Pulsed Lavage with Suction - Normal Saline Used 1000 mL  Pulsed Lavage Tip Tip with splash shield  Pulsed lavage therapy - wound location lt perineum  Selective Debridement  Selective Debridement - Location lt perineum  Selective Debridement - Tools Used Forceps;Scissors  Selective Debridement - Tissue Removed yellow slough  Wound Therapy - Assess/Plan/Recommendations  Wound Therapy - Clinical Statement Wound making progress.  Hydrotherapy Plan Debridement;Dressing change;Patient/family education;Pulsatile lavage with suction  Wound Therapy - Frequency 6X / week  Wound Therapy - Follow Up Recommendations Skilled nursing facility  Wound Therapy Goals - Improve the function of patient's integumentary system by progressing the wound(s) through the phases of wound healing by:  Decrease Necrotic Tissue - Progress Progressing toward goal  Increase Granulation Tissue - Progress Progressing toward goal    [facility] PT [phone]

## 2011-11-09 NOTE — Progress Notes (Signed)
Occupational Therapy Treatment Patient Details Name: [name] MRN: [id number] DOB: 11-26-1940 Today's Date: 11/09/2011  OT Assessment/Plan OT Assessment/Plan Comments on Treatment Session: Limited secondary to pt refusing OOB with therapy secondary to upcoming test. OT Plan: Discharge plan remains appropriate Follow Up Recommendations: No OT follow up Equipment Recommended: 3 in 1 bedside commode OT Goals ADL Goals ADL Goal: Toilet Transfer - Progress: Progressing toward goals  OT Treatment Precautions/Restrictions  Precautions Precautions: Fall Restrictions Weight Bearing Restrictions: No   ADL ADL ADL Comments: Pt schedule for test and refusing OOB with therapy at this time. However, pt with questions regarding AE family had purchased for her from gift shop. Educated pt on use of AE. Pt verbalized understanding but again, refused OOB to practice. Pt reports ambulating to and from bathroom Mod I with use of RW. Also, reports being "unsteady" upon sit to stand. D/w ordering 3n1 for home. Also, educated pt that 3n1 can be used in shower as opposed to borrowing seat from friend. pt states she would like to wait to make final decision re: 3n1.  End of Session General Behavior During Session: [facility] for tasks performed Cognition: [facility] for tasks performed  Birdell Frasier  11/09/2011, 3:09 PM

## 2011-11-09 NOTE — Progress Notes (Signed)
PT Cancellation Note  Treatment cancelled today due to patient's refusal to participate.  Virl Cagey, [place] [phone]  11/09/2011, 3:40 PM

## 2011-11-10 LAB — BASIC METABOLIC PANEL
Calcium: 8.3 mg/dL — ABNORMAL LOW (ref 8.4–10.5)
Creatinine, Ser: 1.57 mg/dL — ABNORMAL HIGH (ref 0.50–1.10)
GFR calc Af Amer: 37 mL/min — ABNORMAL LOW (ref >90)
GFR calc non Af Amer: 32 mL/min — ABNORMAL LOW (ref >90)

## 2011-11-10 LAB — CBC
MCH: 29.4 pg (ref 26.0–34.0)
MCHC: 34 g/dL (ref 30.0–36.0)
MCV: 86.5 fL (ref 78.0–100.0)
Platelets: 606 10*3/uL — ABNORMAL HIGH (ref 150–400)
RDW: 14.3 % (ref 11.5–15.5)

## 2011-11-10 LAB — PROTIME-INR: Prothrombin Time: 26.8 seconds — ABNORMAL HIGH (ref 11.6–15.2)

## 2011-11-10 MED ORDER — WARFARIN SODIUM 3 MG PO TABS
3.0000 mg | ORAL_TABLET | Freq: Once | ORAL | Status: AC
  Administered 2011-11-10: 3 mg via ORAL
  Filled 2011-11-10: qty 1

## 2011-11-10 NOTE — Progress Notes (Signed)
Physical Therapy Wound Treatment Patient Details  Name: [name] MRN: [id number] Date of Birth: 1940/08/24  Today's Date: 11/10/2011 Time: 10:04-10:35 Time Calculation (min): 31 minutes  Subjective  Subjective: Pt and spouse agreeable to treatment. Both with questions about wound healing.  Pain Score: Pain Score:   1  Wound Assessment  Wound 11/08/11 Other (Comment) Perineum Left Open surgical incision (Active)  Site / Wound Assessment Brown;Pink;Yellow 11/10/2011 10:35 AM  % Wound base Red or Granulating 50% 11/10/2011 10:35 AM  % Wound base Yellow 45% 11/10/2011 10:35 AM  % Wound base Other (Comment) 5% 11/10/2011 10:35 AM  Peri-wound Assessment Erythema (blanchable) 11/10/2011 10:35 AM  Wound Length (cm) 22 cm 11/08/2011  1:23 PM  Wound Width (cm) 4 cm 11/08/2011  1:23 PM  Wound Depth (cm) 5 cm 11/08/2011  1:23 PM  Margins Unattacted edges (unapproximated) 11/10/2011 10:35 AM  Drainage Amount Minimal 11/10/2011 10:35 AM  Drainage Description Serosanguineous;No odor 11/10/2011 10:35 AM  Treatment Hydrotherapy (Pulse lavage);Debridement (Selective);Packing (Saline gauze) 11/10/2011 10:35 AM  Dressing Type Moist to dry 11/10/2011 10:35 AM  Dressing Changed Changed 11/10/2011  8:45 AM  Dressing Status Clean 11/09/2011 10:35 AM   Hydrotherapy Pulsed lavage therapy - wound location: left perineum Pulsed Lavage with Suction (psi): 8 psi Pulsed Lavage with Suction - Normal Saline Used: 1000 mL Pulsed Lavage Tip: Tip with splash shield Selective Debridement Selective Debridement - Location: left perineum Selective Debridement - Tools Used: Forceps;Scissors Selective Debridement - Tissue Removed: yellow slough   Wound Assessment and Plan  Wound Therapy - Assess/Plan/Recommendations Wound Therapy - Clinical Statement: wound progressing. microguard powder applied to rash on periwound skin per orders. Hydrotherapy Plan: Debridement;Dressing change;Patient/family education;Pulsatile lavage  with suction Wound Therapy - Frequency: 6X / week Wound Therapy - Follow Up Recommendations: Skilled nursing facility  Wound Therapy Goals- Improve the function of patient's integumentary system by progressing the wound(s) through the phases of wound healing (inflammation - proliferation - remodeling) by: Increase Granulation Tissue - Progress: Progressing toward goal  Goals will be updated until maximal potential achieved or discharge criteria met.  Discharge criteria: when goals achieved, discharge from hospital, MD decision/surgical intervention, no progress towards goals, refusal/missing three consecutive treatments without notification or medical reason.  Sallyanne Kuster 11/10/2011, 1:05 PM  Sallyanne Kuster, PTA Office- [phone] Pager- [phone]

## 2011-11-10 NOTE — Progress Notes (Signed)
Physical Therapy Treatment Patient Details Name: [name] MRN: [id number] DOB: 09/28/1940 Today's Date: 11/10/2011  PT Assessment/Plan  PT - Assessment/Plan Comments on Treatment Session: Pt did well with SPC however needed up to min assist secondary to lateral losses of balance, recommend use of SPC with nursing only and RW when ambulating with husband. Pt verbalized understanding. Pt reporting SNF the plan secondary to wound care needs, recommend pt receive PT as well to work on balance, functional independence, and decrease risk for falls.  PT Plan: Frequency remains appropriate;Discharge plan needs to be updated PT Frequency: Min 3X/week Follow Up Recommendations: Home health PT; PT at SNF; Supervision for mobility/OOB Equipment Recommended: 3 in 1 bedside comode PT Goals  Acute Rehab PT Goals Pt will go Supine/Side to Sit: Independently PT Goal: Supine/Side to Sit - Progress: Progressing toward goal Pt will go Sit to Supine/Side: with supervision PT Goal: Sit to Supine/Side - Progress: Progressing toward goal Pt will go Sit to Stand: with upper extremity assist;with modified independence PT Goal: Sit to Stand - Progress:  (progressed) Pt will go Stand to Sit: with upper extremity assist;with modified independence PT Goal: Stand to Sit - Progress: Progressing toward goal Pt will Transfer Bed to Chair/Chair to Bed: with modified independence PT Transfer Goal: Bed to Chair/Chair to Bed - Progress: Progressing toward goal Pt will Ambulate: 51 - 150 feet;with least restrictive assistive device;with supervision PT Goal: Ambulate - Progress: Progressing toward goal  PT Treatment Precautions/Restrictions  Precautions Precautions: Fall Required Braces or Orthoses DO NOT USE: No Restrictions Weight Bearing Restrictions: No Mobility (including Balance) Bed Mobility Supine to Sit: 5: Supervision;With rails;HOB flat (45 degrees) Supine to Sit Details (indicate cue type and  reason): Verbal cues for log roll technique for pain modulation and efficiency. Sitting - Scoot to Edge of Bed: 6: Modified independent (Device/Increase time) Transfers Transfers: Yes Sit to Stand: 5: Supervision;From bed (mingaurdA) Sit to Stand Details (indicate cue type and reason): Supervision for safety, some lateral instability.  Stand to Sit: 5: Supervision;With armrests;To chair/3-in-1;With upper extremity assist;To bed Stand to Sit Details: Performed x2. Verbal cues for control of descent.  Ambulation/Gait Ambulation/Gait: Yes Ambulation/Gait Assistance: 4: Min assist (mingaurdA) Ambulation/Gait Assistance Details (indicate cue type and reason): up to min assist x2 minimal losses of balance. Otherwise pt min-guard assist. Verbal cues for sequencing of cane.  Ambulation Distance (Feet): 200 Feet Assistive device: Straight cane Gait Pattern: Step-through pattern;Trunk flexed;Shuffle (Decreased foot clearance bilaterally. )  Static Sitting Balance Static Sitting - Balance Support: No upper extremity supported Static Sitting - Level of Assistance: 7: Independent Static Standing Balance Static Standing - Balance Support: No upper extremity supported Static Standing - Level of Assistance: 5: Stand by assistance;4: Min assist Static Standing - Comment/# of Minutes: Stand by assist for normal and narrow stance, eyes open and closed. x 30 sec each. Then needed min assist with tandem performed 3 x 30 sec both legs.  End of Session PT - End of Session Equipment Utilized During Treatment: Gait belt Activity Tolerance: Patient tolerated treatment well Patient left: with call bell in reach;in bed (per pt request) General Behavior During Session: [facility] for tasks performed Cognition: [facility] for tasks performed  Wilhemina Bonito 11/10/2011, 12:52 PM  Sherie Don) Carleene Mains PT, DPT Acute Rehabilitation [phone]

## 2011-11-10 NOTE — Progress Notes (Signed)
9 Days Post-Op  Subjective: Patient up ambulating.  Wound cleaning up nicely with hydrotherapy.  SNF placement pending.  CT scan showed no intra-abdominal/ intra-pelvic abscess  Objective: Vital signs in last 24 hours: Temp:  [97.7 F (36.5 C)-98.2 F (36.8 C)] 97.7 F (36.5 C) (04/13 0600) Pulse Rate:  [68-82] 72  (04/13 0600) Resp:  [18-22] 20  (04/13 0600) BP: (119-136)/(50-93) 136/57 mmHg (04/13 0600) SpO2:  [92 %-99 %] 97 % (04/13 0600) Last BM Date: 11/09/11  Intake/Output from previous day: 04/12 0701 - 04/13 0700 In: 1574 [P.O.:840; I.V.:684; IV Piggyback:50] Out: -  Intake/Output this shift:    Wound - some surrounding skin maceration from the drainage and moisture The wound is beginning to granulate Lab Results:   [facility] 11/10/11 0816 11/09/11 0505  WBC 19.0* 22.8*  HGB 8.9* 8.9*  HCT 26.2* 27.1*  PLT 606* 611*   BMET  Basename 11/10/11 0816 11/09/11 0505  NA 140 140  K 3.1* 3.1*  CL 102 105  CO2 26 24  GLUCOSE 106* 101*  BUN 14 14  CREATININE 1.57* 1.59*  CALCIUM 8.3* 8.3*   PT/INR  Basename 11/10/11 0816 11/09/11 0505  LABPROT 26.8* 23.0*  INR 2.43* 2.00*   ABG No results found for this basename: PHART:2,PCO2:2,PO2:2,HCO3:2 in the last 72 hours  Studies/Results: Ct Pelvis Wo Contrast  11/09/2011  *RADIOLOGY REPORT*  Clinical Data:  Increasing leukocytosis and rectal discomfort status post irrigation & drainage for necrotizing soft tissue infection.  CT PELVIS WITHOUT CONTRAST  Technique:  Multidetector CT imaging of the pelvis was performed following the standard protocol without intravenous contrast.  Comparison:  Pelvic CT 11/01/2011.  Findings:  There has been interval improvement in the extensive soft tissue emphysema involving the inguinal and perineal regions, left greater than right.  There is a small fluid collection within the left inguinal canal, measuring 4.6 cm on image 33.  There is also a small amount of fluid within the right  ischiorectal fossa, measuring 3.5 cm on image 37. No progressive cellulitis is evident.  There is no evidence of intrapelvic fluid collection.  The bladder appears normal.  Sigmoid colon diverticular changes are stable without surrounding inflammation.  No acute osseous findings are demonstrated.  There are stable asymmetric degenerative changes of the left hip.  Mild sacroiliac degenerative changes are stable.  IMPRESSION:  1.  Interval improvement in the perineal and inguinal soft tissue emphysema. 2.  Small fluid collections in the left inguinal canal and right ischiofemoral fossa. 3.  No evidence of intrapelvic abscess or osteomyelitis.  Original Report Authenticated By: Gerrianne Scale, M.D.    Anti-infectives: Anti-infectives     Start     Dose/Rate Route Frequency Ordered Stop   11/05/11 1200   cefTRIAXone (ROCEPHIN) 1 g in dextrose 5 % 50 mL IVPB        1 g 100 mL/hr over 30 Minutes Intravenous Every 24 hours 11/05/11 0926     11/02/11 0000   vancomycin (VANCOCIN) 1,250 mg in sodium chloride 0.9 % 250 mL IVPB  Status:  Discontinued        1,250 mg 166.7 mL/hr over 90 Minutes Intravenous Every 12 hours 11/01/11 1858 11/05/11 0057   11/01/11 2200   piperacillin-tazobactam (ZOSYN) IVPB 3.375 g  Status:  Discontinued        3.375 g 12.5 mL/hr over 240 Minutes Intravenous 3 times per day 11/01/11 1843 11/05/11 0926   11/01/11 1351   piperacillin-tazobactam (ZOSYN) 3-0.375 GM/50ML IVPB  Comments: KOHUT, NIKKI: cabinet override         11/01/11 1351 11/01/11 1400   11/01/11 1315   gentamicin (GARAMYCIN) 1.5 mg/kg in dextrose 5 % 50 mL IVPB  Status:  Discontinued        1.5 mg/kg 100 mL/hr over 30 Minutes Intravenous  Once 11/01/11 1310 11/01/11 1404   11/01/11 1130   vancomycin (VANCOCIN) IVPB 1000 mg/200 mL premix        1,000 mg 200 mL/hr over 60 Minutes Intravenous  Once 11/01/11 1127 11/01/11 1441   11/01/11 1130   cefTRIAXone (ROCEPHIN) 1 g in dextrose 5 % 50 mL IVPB         1 g 100 mL/hr over 30 Minutes Intravenous  Once 11/01/11 1127 11/01/11 1205          Assessment/Plan: Debridement of perineal necrotizing infection -  Continue hydrotherapy, antibiotics Anticoagulation - INR is therapeutic; dosing per Pharmacy SNF placement to help with dressing changes  LOS: 9 days    Ronny Korff K. 11/10/2011

## 2011-11-10 NOTE — Progress Notes (Signed)
ANTICOAGULATION CONSULT NOTE - Follow Up Consult  Pharmacy Consult for Coumadin Indication: atrial fibrillation  Allergies  Allergen Reactions  . Diclofenac Itching    Feels difficult to swallow, but no tongue swelling or SOB    Patient Measurements: Height: 5\' 7"  (170.2 cm) Weight: 206 lb 2.1 oz (93.5 kg) IBW/kg (Calculated) : 61.6   Vital Signs: Temp: 97.7 F (36.5 C) (04/13 0600) Temp src: Oral (04/13 0600) BP: 136/57 mmHg (04/13 0600) Pulse Rate: 72  (04/13 0600)  Labs:  Basename 11/10/11 0816 11/09/11 0505 11/08/11 0635  HGB 8.9* 8.9* --  HCT 26.2* 27.1* 29.4*  PLT 606* 611* 555*  APTT -- -- --  LABPROT 26.8* 23.0* 22.7*  INR 2.43* 2.00* 1.96*  HEPARINUNFRC -- -- --  CREATININE -- 1.59* --  CKTOTAL -- -- --  CKMB -- -- --  TROPONINI -- -- --   Estimated Creatinine Clearance: 37.6 ml/min (by C-G formula based on Cr of 1.59).  Assessment:  Admit Complaint: Pain, rash --> necrotizing fasciitis of labia /perineum/thigh s/p I&D.  Anticoag: 71 y.o. F on warfarin for hx Afib.. INR up to 2.43 today. Hgbdown to 8.9, no s/sx of bleeding noted. LFTs noted to be increased on 4/8 labs which can increase warfarin sensitivity. Lovenox d/c'd.  Infectious Disease:CTX #6 (s/p Vanco/Zosyn). Afebrile, WBC 19. SCr 1.59.  -Cx Data: 4/4 Wound x2: Moderate Strep Group F 4/4 Blood x2: NGTD  Cardiovascular: Hx HTN/DL and new onset Afib in the setting of acute illness.VSS Meds: Norvasc, Lipitor, HCTZ, lopressor  Neurology: On prn morphine (PTA paxil) - Paxil resumed  Endo: Home Synthroid  Nephrology: Scr trending back downward 1.59 now.  Hematology / Oncology: Hx breast CA s/p R mastectomy + L lumpectomy. Last CBC 4/5.    Goal of Therapy:  INR 2-3   Plan:  1. Warfarin 3 mg x 1 at 1800 today 3. Awaiting SNF.  Kalenna Millett S. Merilynn Finland, PharmD, [facility] Clinical Staff Pharmacist Pager [phone]  11/10/2011 9:01 AM

## 2011-11-11 LAB — PROTIME-INR: Prothrombin Time: 24.2 seconds — ABNORMAL HIGH (ref 11.6–15.2)

## 2011-11-11 MED ORDER — WARFARIN SODIUM 3 MG PO TABS
3.0000 mg | ORAL_TABLET | Freq: Every day | ORAL | Status: DC
  Administered 2011-11-11 – 2011-11-12 (×2): 3 mg via ORAL
  Filled 2011-11-11 (×2): qty 1

## 2011-11-11 NOTE — Progress Notes (Signed)
ANTICOAGULATION CONSULT NOTE - Follow Up Consult  Pharmacy Consult for Coumadin Indication: atrial fibrillation  Allergies  Allergen Reactions  . Diclofenac Itching    Feels difficult to swallow, but no tongue swelling or SOB    Patient Measurements: Height: 5\' 7"  (170.2 cm) Weight: 206 lb 2.1 oz (93.5 kg) IBW/kg (Calculated) : 61.6   Vital Signs: Temp: 98.4 F (36.9 C) (04/14 0528) Temp src: Oral (04/14 0528) BP: 134/55 mmHg (04/14 0528) Pulse Rate: 66  (04/14 0528)  Labs:  Basename 11/11/11 0655 11/10/11 0816 11/09/11 0505  HGB -- 8.9* 8.9*  HCT -- 26.2* 27.1*  PLT -- 606* 611*  APTT -- -- --  LABPROT 24.2* 26.8* 23.0*  INR 2.13* 2.43* 2.00*  HEPARINUNFRC -- -- --  CREATININE -- 1.57* 1.59*  CKTOTAL -- -- --  CKMB -- -- --  TROPONINI -- -- --   Estimated Creatinine Clearance: 38 ml/min (by C-G formula based on Cr of 1.57).  Assessment:  Admit Complaint: Pain, rash --> necrotizing fasciitis of labia/ perineum/thigh s/p I&D.  Anticoag: 71 y.o. F on warfarin for Afib.. INR 2.13 today. Hgbdown to 8.9, no s/sx of bleeding noted. LFTs noted to be increased on 4/8 labs which can increase warfarin sensitivity. Lovenox d/c'd.  Infectious Disease:CTX #7 (s/p Vanco/Zosyn). Afebrile, WBC 19. SCr 1.57. MD note wounds improving nicely with hydrotherapy and antibiotics.  -Cx Data: 4/4 Wound x2: Moderate Strep Group F 4/4 Blood x2: NGTD  Cardiovascular: Hx HTN/DL and new onset Afib in the setting of acute illness.VSS Meds: Norvasc, Lipitor, HCTZ, lopressor  Neurology: On prn morphine,- home Paxil resumed  Endo: Home Synthroid  Nephrology: Scr trending back downward 1.57 now.  Hematology / Oncology: Hx breast CA s/p R mastectomy + L lumpectomy. Last CBC 4/5.    Goal of Therapy:  INR 2-3   Plan:  1. Try Coumadin 3mg  daily for maintenance.  3. Awaiting SNF.  Anais Denslow S. Merilynn Finland, PharmD, [facility] Clinical Staff Pharmacist Pager [phone]  11/11/2011 8:35 AM

## 2011-11-11 NOTE — Progress Notes (Signed)
10 Days Post-Op  Subjective: No complaints PT recommends PT at SNF for continued strengthening Hydrotherapy yesterday - 6x/week  Objective: Vital signs in last 24 hours: Temp:  [97.5 F (36.4 C)-98.4 F (36.9 C)] 98.4 F (36.9 C) (04/14 0528) Pulse Rate:  [66-79] 66  (04/14 0528) Resp:  [18] 18  (04/14 0528) BP: (115-134)/(50-55) 134/55 mmHg (04/14 0528) SpO2:  [95 %-98 %] 95 % (04/14 0528) Last BM Date: 11/10/11  Intake/Output from previous day: 04/13 0701 - 04/14 0700 In: 1440 [P.O.:240; I.V.:1200] Out: -  Intake/Output this shift:    General appearance: alert, cooperative and no distress Wound not examined today, since patient eating  Lab Results:   [facility] 11/10/11 0816 11/09/11 0505  WBC 19.0* 22.8*  HGB 8.9* 8.9*  HCT 26.2* 27.1*  PLT 606* 611*   BMET  Basename 11/10/11 0816 11/09/11 0505  NA 140 140  K 3.1* 3.1*  CL 102 105  CO2 26 24  GLUCOSE 106* 101*  BUN 14 14  CREATININE 1.57* 1.59*  CALCIUM 8.3* 8.3*   PT/INR  Basename 11/11/11 0655 11/10/11 0816  LABPROT 24.2* 26.8*  INR 2.13* 2.43*   ABG No results found for this basename: PHART:2,PCO2:2,PO2:2,HCO3:2 in the last 72 hours  Studies/Results: Ct Pelvis Wo Contrast  11/09/2011  *RADIOLOGY REPORT*  Clinical Data:  Increasing leukocytosis and rectal discomfort status post irrigation & drainage for necrotizing soft tissue infection.  CT PELVIS WITHOUT CONTRAST  Technique:  Multidetector CT imaging of the pelvis was performed following the standard protocol without intravenous contrast.  Comparison:  Pelvic CT 11/01/2011.  Findings:  There has been interval improvement in the extensive soft tissue emphysema involving the inguinal and perineal regions, left greater than right.  There is a small fluid collection within the left inguinal canal, measuring 4.6 cm on image 33.  There is also a small amount of fluid within the right ischiorectal fossa, measuring 3.5 cm on image 37. No progressive cellulitis  is evident.  There is no evidence of intrapelvic fluid collection.  The bladder appears normal.  Sigmoid colon diverticular changes are stable without surrounding inflammation.  No acute osseous findings are demonstrated.  There are stable asymmetric degenerative changes of the left hip.  Mild sacroiliac degenerative changes are stable.  IMPRESSION:  1.  Interval improvement in the perineal and inguinal soft tissue emphysema. 2.  Small fluid collections in the left inguinal canal and right ischiofemoral fossa. 3.  No evidence of intrapelvic abscess or osteomyelitis.  Original Report Authenticated By: Gerrianne Scale, M.D.    Anti-infectives: Anti-infectives     Start     Dose/Rate Route Frequency Ordered Stop   11/05/11 1200   cefTRIAXone (ROCEPHIN) 1 g in dextrose 5 % 50 mL IVPB        1 g 100 mL/hr over 30 Minutes Intravenous Every 24 hours 11/05/11 0926     11/02/11 0000   vancomycin (VANCOCIN) 1,250 mg in sodium chloride 0.9 % 250 mL IVPB  Status:  Discontinued        1,250 mg 166.7 mL/hr over 90 Minutes Intravenous Every 12 hours 11/01/11 1858 11/05/11 0057   11/01/11 2200   piperacillin-tazobactam (ZOSYN) IVPB 3.375 g  Status:  Discontinued        3.375 g 12.5 mL/hr over 240 Minutes Intravenous 3 times per day 11/01/11 1843 11/05/11 0926   11/01/11 1351   piperacillin-tazobactam (ZOSYN) 3-0.375 GM/50ML IVPB     Comments: KOHUT, NIKKI: cabinet override  11/01/11 1351 11/01/11 1400   11/01/11 1315   gentamicin (GARAMYCIN) 1.5 mg/kg in dextrose 5 % 50 mL IVPB  Status:  Discontinued        1.5 mg/kg 100 mL/hr over 30 Minutes Intravenous  Once 11/01/11 1310 11/01/11 1404   11/01/11 1130   vancomycin (VANCOCIN) IVPB 1000 mg/200 mL premix        1,000 mg 200 mL/hr over 60 Minutes Intravenous  Once 11/01/11 1127 11/01/11 1441   11/01/11 1130   cefTRIAXone (ROCEPHIN) 1 g in dextrose 5 % 50 mL IVPB        1 g 100 mL/hr over 30 Minutes Intravenous  Once 11/01/11 1127 11/01/11  1205          Assessment/Plan: s/p Procedure(s) (LRB): IRRIGATION AND DEBRIDEMENT PERIRECTAL ABSCESS (N/A) Awaiting SNF placement.  Continue PT and hydrotherapy.  LOS: 10 days    Curran Lenderman K. 11/11/2011

## 2011-11-12 DIAGNOSIS — N9089 Other specified noninflammatory disorders of vulva and perineum: Secondary | ICD-10-CM | POA: Diagnosis not present

## 2011-11-12 DIAGNOSIS — I10 Essential (primary) hypertension: Secondary | ICD-10-CM | POA: Diagnosis not present

## 2011-11-12 DIAGNOSIS — M069 Rheumatoid arthritis, unspecified: Secondary | ICD-10-CM | POA: Diagnosis not present

## 2011-11-12 DIAGNOSIS — B999 Unspecified infectious disease: Secondary | ICD-10-CM | POA: Diagnosis not present

## 2011-11-12 DIAGNOSIS — Z5189 Encounter for other specified aftercare: Secondary | ICD-10-CM | POA: Diagnosis not present

## 2011-11-12 DIAGNOSIS — Z7901 Long term (current) use of anticoagulants: Secondary | ICD-10-CM | POA: Diagnosis not present

## 2011-11-12 DIAGNOSIS — R269 Unspecified abnormalities of gait and mobility: Secondary | ICD-10-CM | POA: Diagnosis not present

## 2011-11-12 DIAGNOSIS — A419 Sepsis, unspecified organism: Secondary | ICD-10-CM | POA: Diagnosis not present

## 2011-11-12 DIAGNOSIS — E785 Hyperlipidemia, unspecified: Secondary | ICD-10-CM | POA: Diagnosis not present

## 2011-11-12 DIAGNOSIS — A4159 Other Gram-negative sepsis: Secondary | ICD-10-CM | POA: Diagnosis not present

## 2011-11-12 DIAGNOSIS — A499 Bacterial infection, unspecified: Secondary | ICD-10-CM | POA: Diagnosis not present

## 2011-11-12 DIAGNOSIS — F329 Major depressive disorder, single episode, unspecified: Secondary | ICD-10-CM | POA: Diagnosis not present

## 2011-11-12 DIAGNOSIS — I4891 Unspecified atrial fibrillation: Secondary | ICD-10-CM | POA: Diagnosis not present

## 2011-11-12 DIAGNOSIS — M6281 Muscle weakness (generalized): Secondary | ICD-10-CM | POA: Diagnosis not present

## 2011-11-12 DIAGNOSIS — D72829 Elevated white blood cell count, unspecified: Secondary | ICD-10-CM | POA: Diagnosis not present

## 2011-11-12 DIAGNOSIS — E039 Hypothyroidism, unspecified: Secondary | ICD-10-CM | POA: Diagnosis not present

## 2011-11-12 DIAGNOSIS — T8140XA Infection following a procedure, unspecified, initial encounter: Secondary | ICD-10-CM | POA: Diagnosis not present

## 2011-11-12 MED ORDER — LEVOTHYROXINE SODIUM 25 MCG PO TABS: 25.0000 ug | ORAL_TABLET | Freq: Every day | ORAL | Status: DC

## 2011-11-12 MED ORDER — AMOXICILLIN-POT CLAVULANATE 875-125 MG PO TABS: 1.0000 | ORAL_TABLET | Freq: Two times a day (BID) | ORAL | Status: AC

## 2011-11-12 MED ORDER — AMOXICILLIN-POT CLAVULANATE 875-125 MG PO TABS
1.0000 | ORAL_TABLET | Freq: Two times a day (BID) | ORAL | Status: DC
  Administered 2011-11-12: 1 via ORAL
  Filled 2011-11-12 (×2): qty 1

## 2011-11-12 MED ORDER — WARFARIN SODIUM 2.5 MG PO TABS: 2.5000 mg | ORAL_TABLET | Freq: Every day | ORAL | Status: DC

## 2011-11-12 MED ORDER — NYSTATIN 100000 UNIT/GM EX POWD: 1.0000 [IU] | Freq: Three times a day (TID) | CUTANEOUS | Status: DC

## 2011-11-12 MED ORDER — OXYCODONE-ACETAMINOPHEN 5-325 MG PO TABS: 1.0000 | ORAL_TABLET | ORAL | Status: AC | PRN

## 2011-11-12 NOTE — Progress Notes (Signed)
Physical Therapy Wound Treatment Patient Details  Name: [name] MRN: [id number] Date of Birth: 02/21/1941  Today's Date: 11/12/2011 Time: 4098-1191 Time Calculation (min): 75 min  Subjective  Subjective: Pt wants to go home and play dressing change "by ear", husband relates that he thinks it will be hard and it already hurt his back Patient and Family Stated Goals: Wound to heal  Pain Score:    Wound Assessment  Wound 11/08/11 Other (Comment) Perineum Left Open surgical incision (Active)  Site / Wound Assessment Pink;Yellow 11/12/2011 11:00 AM  % Wound base Red or Granulating 75% 11/12/2011 11:00 AM  % Wound base Yellow 25% 11/12/2011 11:00 AM  % Wound base Other (Comment) 5% 11/10/2011 10:35 AM  Peri-wound Assessment Erythema (blanchable) 11/12/2011 11:00 AM  Wound Length (cm) 19.5 cm 11/12/2011 11:00 AM  Wound Width (cm) 3 cm 11/12/2011 11:00 AM  Wound Depth (cm) 5.4 cm 11/12/2011 11:00 AM  Margins Unattacted edges (unapproximated) 11/12/2011 11:00 AM  Drainage Amount Minimal 11/12/2011 11:00 AM  Drainage Description Serosanguineous;No odor 11/12/2011 11:00 AM  Treatment Cleansed;Debridement (Selective);Hydrotherapy (Pulse lavage);Packing (Saline gauze);Other (Comment) 11/12/2011 11:00 AM  Dressing Type Moist to dry;Mesh briefs 11/12/2011 11:00 AM  Dressing Changed New 11/12/2011 11:00 AM  Dressing Status Dry;Intact 11/12/2011 11:00 AM     Incision 11/01/11 Perineum Left (Active)  Site / Wound Assessment Red;Other (Comment) 11/11/2011  9:00 PM  Margins Unattacted edges (unapproximated) 11/11/2011  8:30 AM  Closure None 11/09/2011  7:40 PM  Drainage Amount Minimal 11/11/2011  8:30 AM  Drainage Description Serosanguineous 11/11/2011  9:00 PM  Treatment Other (Comment) 11/11/2011  8:30 AM  Dressing Type Abdominal binder;Gauze (Comment) 11/10/2011  8:14 PM  Dressing Dry;Intact 11/12/2011  9:30 AM   Hydrotherapy Pulsed lavage therapy - wound location: left perineum Pulsed Lavage with  Suction (psi): 4 psi Pulsed Lavage with Suction - Normal Saline Used: 1000 mL Pulsed Lavage Tip: Tip with splash shield Selective Debridement Selective Debridement - Location: left perineum Selective Debridement - Tools Used: Forceps;Scissors Selective Debridement - Tissue Removed: yellow slough   Wound Assessment and Plan  Wound Therapy - Assess/Plan/Recommendations Wound Therapy - Clinical Statement: wound progressing. microguard powder applied to rash on periwound skin per orders. Wound Therapy - Functional Problem List: Decr skin integrity Factors Delaying/Impairing Wound Healing: Infection - systemic/local Hydrotherapy Plan: Debridement;Dressing change;Patient/family education;Pulsatile lavage with suction Wound Therapy - Frequency: 6X / week Wound Therapy - Current Recommendations: WOC nurse Wound Therapy - Follow Up Recommendations: Skilled nursing facility  Wound Therapy Goals- Improve the function of patient's integumentary system by progressing the wound(s) through the phases of wound healing (inflammation - proliferation - remodeling) by: Decrease Necrotic Tissue to: 25% Decrease Necrotic Tissue - Progress: Met Increase Granulation Tissue - Progress: Met  Goals will be updated until maximal potential achieved or discharge criteria met.  Discharge criteria: when goals achieved, discharge from hospital, MD decision/surgical intervention, no progress towards goals, refusal/missing three consecutive treatments without notification or medical reason.  Lakena Sparlin, Eliseo Gum 11/12/2011, 1:38 PM  11/12/2011  [place] Bing, PT [phone] (pager)

## 2011-11-12 NOTE — Progress Notes (Signed)
PT Cancellation Note  Treatment cancelled today due to patient's refusal to participate. Pt reports being exhausted from wound care this morning. Noted pt likely to d/c SNF this afternoon.   [facility] [name] 11/12/2011, 11:22 AM Pager: [phone]

## 2011-11-12 NOTE — Discharge Planning (Signed)
Patient discharged to SNF in stable condition.  

## 2011-11-12 NOTE — Progress Notes (Signed)
Clinical Social Work Department CLINICAL SOCIAL WORK PLACEMENT NOTE 11/12/2011  Patient:  [name], [name]  Account Number:  [id number] Admit date:  11/01/2011  Clinical Social Worker:  Margaree Mackintosh  Date/time:  11/08/2011 12:00 M  Clinical Social Work is seeking post-discharge placement for this patient at the following level of care:   SKILLED NURSING   (*CSW will update this form in Epic as items are completed)   11/08/2011  Patient/family provided with [facility] Department of Clinical Social Work's list of facilities offering this level of care within the geographic area requested by the patient (or if unable, by the patient's family).  11/08/2011  Patient/family informed of their freedom to choose among providers that offer the needed level of care, that participate in Medicare, Medicaid or managed care program needed by the patient, have an available bed and are willing to accept the patient.  11/08/2011  Patient/family informed of [facility]' ownership interest in [facility], as well as of the fact that they are under no obligation to receive care at this facility.  PASARR submitted to EDS on 11/08/2011 PASARR number received from EDS on   FL2 transmitted to all facilities in geographic area requested by pt/family on  11/08/2011 FL2 transmitted to all facilities within larger geographic area on   Patient informed that his/her managed care company has contracts with or will negotiate with  certain facilities, including the following:   [facility]     Patient/family informed of bed offers received:  11/08/2011 Patient chooses bed at [facility] Physician recommends and patient chooses bed at    Patient to be transferred to [facility] on  11/12/2011 Patient to be transferred to facility by [facility]  The following physician request were entered in Epic:   Additional Comments: pt prefers [facility].

## 2011-11-12 NOTE — Discharge Instructions (Signed)
Normal Saline wet to dry dressing changes to wound twice a day or as needed if dressing gets soiled  Dressing Change Dressings are placed over wounds to keep them clean, dry, and protected from further injury. This provides an environment that favors wound healing. Good wound care includes resting and elevating the injured part until the pain and swelling are better. Change your wound dressing as recommended by your caregiver. When removing an old dressing, lift it slowly away from the wound. If the dressing sticks to the wound, dampen it with half-strength peroxide or tap water. Clean the wound gently with a moist cloth, remove any loose material, and apply antibiotic ointment if recommended by your caregiver. Usually it is okay for a wound to get wet. Wash it with mildly soapy water. Watch for signs of infection when changing a dressing. SEEK MEDICAL CARE IF:  You develop increased pain, redness, or swelling.   You have pus-like drainage from the wound.   You develop a fever greater than 100.4 F (38 C).  Document Released: 08/23/2004 Document Revised: 07/05/2011 Document Reviewed: 05/28/2011 [facility] Patient Information 2012 [place], [place].

## 2011-11-12 NOTE — Progress Notes (Signed)
Pt has accepted bed offer at [facility]. CSW has sent d/c summary and planning for transport by [facility] this afternoon. CSW will follow to facilitate d/c to SNF.  Baxter Flattery, MSW [phone]

## 2011-11-12 NOTE — Discharge Summary (Signed)
Patient ID: [name] MRN: [id number] DOB/AGE: October 07, 1940 71 y.o.  Admit date: 11/01/2011 Discharge date: 11/12/2011  Procedures: incision and drainage of left perineal abscess  Consults: cardiology, Dr. Katrinka Blazing  Reason for Admission: This is a 71 yo female who presented to the [facility] with sepsis secondary to a necrotizing infection to her perineum.  Please see admitting H&P for further details.  Admission Diagnoses:  1. Sepsis 2. Necrotizing soft tissue infection of perineum 3. New onset a. Fib 4. HTN 5. H/o breast cancer  Hospital Course: The patient was admitted and taken emergently to the OR for incision and drainage of the left perineal wound.  She was initially placed on Vancomycin and Zosyn for coverage.  Wound cx were obtained in the OR which ultimately revealed Group F Strep and bacteroides.  Her Vanc and zosyn were stopped a couple days post-op secondary to cultures and an elevation in her Cr to 1.8.  She was placed on Rocephin.  Her Cr did trend down and at discharge has decreased to 1.57.  He wound got BID dressing changes. PT hydrotherapy was ordered for wound which helped further clean this [place].  She did develop a little candida infection on her buttocks and this was treated with nystatin powder.  By POD# 11, her wound was continuing to look good with granulation tissue and less necrotic fat.  She did have another elevation of her WBC around POD#7, this did trend back down, but stabilized around 21.  A repeat CT scan of the pelvis was done to assure no further infection was present that was undrained.  Her CT scan was ok and she did not need any further surgical intervention.  She was needing frequent dressing changes due to soiling from urination.  2. A. Fib and hypothyroidism:  When the patient arrived at the hospital, she was found to be in new onset atrial fibrillation.  Cardiology was consulted.  She was placed on coumadin postoperatively.  During this cardiac work-up that  was completed, she was also found to have hypothyroidism.  She was placed on of synthroid.  3. Due to difficulty walking some HHPT or SNF PT was recommended.  At this point, the patient is medically stable on POD# 11.  She will be discharged today to either home or SNF depending on her choice preference.  She will be sent home with another week of Augmentin.  Discharge Diagnoses:  Principal Problem:  *Necrotizing fasciitis Active Problems:  Leukocytosis  Sepsis  Atrial fibrillation   Discharge Medications: Medication List  As of 11/12/2011  9:52 AM   ASK your doctor about these medications         amLODipine 5 MG tablet   Commonly known as: NORVASC   Take 5 mg by mouth daily.      aspirin EC 81 MG tablet   Take 81 mg by mouth daily.      atorvastatin 40 MG tablet   Commonly known as: LIPITOR   Take 40 mg by mouth daily.      calcium carbonate 500 MG chewable tablet   Commonly known as: TUMS - dosed in mg elemental calcium   Chew 1 tablet by mouth daily.      diphenhydrAMINE 25 MG tablet   Commonly known as: BENADRYL   Take 1 tablet (25 mg total) by mouth every 6 (six) hours.      famotidine 20 MG tablet   Commonly known as: PEPCID   Take 1 tablet (20 mg  total) by mouth 2 (two) times daily.      hydrochlorothiazide 25 MG tablet   Commonly known as: HYDRODIURIL   Take 25 mg by mouth daily.      meloxicam 15 MG tablet   Commonly known as: MOBIC   Take 15 mg by mouth daily.      mulitivitamin with minerals Tabs   Take 1 tablet by mouth daily.      PARoxetine 40 MG tablet   Commonly known as: PAXIL   Take 40 mg by mouth every morning.      predniSONE 50 MG tablet   Commonly known as: DELTASONE   Take 1 tablet (50 mg total) by mouth daily.      solifenacin 10 MG tablet   Commonly known as: VESICARE   Take 10 mg by mouth daily.          Coumadin 2.5mg  po daily Augmentin 875mg  1 po BID x7 days Synthroid po daily  Discharge  Instructions: Follow-up Information    Follow up with Lesleigh Noe, MD on 12/05/2011. (9:45 AM)    Contact information:   [address] [place] [address] [place] [address] [phone]       Follow up with [facility], MD. Schedule an appointment as soon as possible for a visit in 2 weeks.   Contact information:   [employer] [address] [place] [address] [phone]       Follow up with Ginette Otto, MD. Schedule an appointment as soon as possible for a visit in 1 weeks.   Contact information:   [address] [place] [address] [place] [address] [phone]        She will need a recheck of her creatinine as an outpatient.  Signed: Tamberlyn Midgley E 11/12/2011, 9:52 AM

## 2011-11-12 NOTE — Progress Notes (Signed)
ANTICOAGULATION CONSULT NOTE - Follow Up Consult  Pharmacy Consult for Coumadin Indication: atrial fibrillation  Allergies  Allergen Reactions  . Diclofenac Itching    Feels difficult to swallow, but no tongue swelling or SOB    Patient Measurements: Height: 5\' 7"  (170.2 cm) Weight: 206 lb 2.1 oz (93.5 kg) IBW/kg (Calculated) : 61.6   Vital Signs: Temp: 98.2 F (36.8 C) (04/15 0522) Temp src: Oral (04/15 0522) BP: 140/69 mmHg (04/15 0522) Pulse Rate: 80  (04/15 0522)  Labs:  Alvira Philips 11/12/11 0650 11/11/11 0655 11/10/11 0816  HGB -- -- 8.9*  HCT -- -- 26.2*  PLT -- -- 606*  APTT -- -- --  LABPROT 26.9* 24.2* 26.8*  INR 2.44* 2.13* 2.43*  HEPARINUNFRC -- -- --  CREATININE -- -- 1.57*  CKTOTAL -- -- --  CKMB -- -- --  TROPONINI -- -- --   Estimated Creatinine Clearance: 38 ml/min (by C-G formula based on Cr of 1.57).  Assessment:  Admit Complaint: Pain, rash --> necrotizing fasciitis of labia/ perineum/thigh s/p I&D.  Anticoag: 71 y.o. F on warfarin for Afib.. INR 2.44 today. Hgbdown to 8.9 on 4/13, no s/sx of bleeding noted. LFTs noted to be increased on 4/8 labs which can increase warfarin sensitivity. Lovenox d/c'd.  Infectious Disease:CTX #8 (s/p Vanco/Zosyn). Afebrile, WBC 19. SCr 1.57. MD note wounds improving nicely with hydrotherapy and antibiotics.  -Cx Data: 4/4 Wound x2: Moderate Strep Group F 4/4 Blood x2: NGTD  Cardiovascular: Hx HTN/DL and new onset Afib in the setting of acute illness.VSS Meds: Norvasc, Lipitor, HCTZ, lopressor  Neurology: On prn morphine,- home Paxil resumed  Endo: Home Synthroid  Nephrology: Scr trending back downward 1.57 now.  Hematology / Oncology: Hx breast CA s/p R mastectomy + L lumpectomy. Last CBC 4/5.    Goal of Therapy:  INR 2-3   Plan:  1. Try Coumadin 2.5mg  daily for maintenance 2. To D/C to SNF this afternoon, suggest INR f/u later this week  Juliette Alcide, PharmD, BCPS.  Pager: [phone] 11/12/2011 12:42  PM

## 2011-11-12 NOTE — Progress Notes (Signed)
CSW has called [facility] for transport. Packet placed in [place]. RN advised. Pt and pt husband agreeable. CSW provided pt husband with directions to [facility].  Clinical Social Worker signing off.  Baxter Flattery, MSW [phone]

## 2011-11-12 NOTE — Progress Notes (Signed)
Patient ID: [name], female   DOB: 11-Sep-1940, 71 y.o.   MRN: [id number] 11 Days Post-Op  Subjective: Pt feels ok and is wanting to leave today.  She is unsure that she wants to go to any of the SNFs that have been offered.  Objective: Vital signs in last 24 hours: Temp:  [98.2 F (36.8 C)-98.3 F (36.8 C)] 98.2 F (36.8 C) (04/15 0522) Pulse Rate:  [73-80] 80  (04/15 0522) Resp:  [18] 18  (04/15 0522) BP: (101-140)/(50-78) 140/69 mmHg (04/15 0522) SpO2:  [94 %-97 %] 95 % (04/15 0522) Last BM Date: 11/11/11  Intake/Output from previous day: 04/14 0701 - 04/15 0700 In: 360 [P.O.:360] Out: -  Intake/Output this shift:    PE: GU: await hydrotherapy to eval wound.  Lab Results:   Basename 11/10/11 0816  WBC 19.0*  HGB 8.9*  HCT 26.2*  PLT 606*   BMET  Basename 11/10/11 0816  NA 140  K 3.1*  CL 102  CO2 26  GLUCOSE 106*  BUN 14  CREATININE 1.57*  CALCIUM 8.3*   PT/INR  Basename 11/12/11 0650 11/11/11 0655  LABPROT 26.9* 24.2*  INR 2.44* 2.13*   CMP     Component Value Date/Time   NA 140 11/10/2011 0816   K 3.1* 11/10/2011 0816   CL 102 11/10/2011 0816   CO2 26 11/10/2011 0816   GLUCOSE 106* 11/10/2011 0816   BUN 14 11/10/2011 0816   CREATININE 1.57* 11/10/2011 0816   CALCIUM 8.3* 11/10/2011 0816   PROT 5.8* 11/06/2011 0615   ALBUMIN 1.6* 11/06/2011 0615   AST 55* 11/06/2011 0615   ALT 108* 11/06/2011 0615   ALKPHOS 174* 11/06/2011 0615   BILITOT 0.2* 11/06/2011 0615   GFRNONAA 32* 11/10/2011 0816   GFRAA 37* 11/10/2011 0816   Lipase  No results found for this basename: lipase       Studies/Results: No results found.  Anti-infectives: Anti-infectives     Start     Dose/Rate Route Frequency Ordered Stop   11/05/11 1200   cefTRIAXone (ROCEPHIN) 1 g in dextrose 5 % 50 mL IVPB        1 g 100 mL/hr over 30 Minutes Intravenous Every 24 hours 11/05/11 0926     11/02/11 0000   vancomycin (VANCOCIN) 1,250 mg in sodium chloride 0.9 % 250 mL IVPB  Status:   Discontinued        1,250 mg 166.7 mL/hr over 90 Minutes Intravenous Every 12 hours 11/01/11 1858 11/05/11 0057   11/01/11 2200   piperacillin-tazobactam (ZOSYN) IVPB 3.375 g  Status:  Discontinued        3.375 g 12.5 mL/hr over 240 Minutes Intravenous 3 times per day 11/01/11 1843 11/05/11 0926   11/01/11 1351  piperacillin-tazobactam (ZOSYN) 3-0.375 GM/50ML IVPB    Comments: KOHUT, NIKKI: cabinet override        11/01/11 1351 11/01/11 1400   11/01/11 1315   gentamicin (GARAMYCIN) 1.5 mg/kg in dextrose 5 % 50 mL IVPB  Status:  Discontinued        1.5 mg/kg 100 mL/hr over 30 Minutes Intravenous  Once 11/01/11 1310 11/01/11 1404   11/01/11 1130   vancomycin (VANCOCIN) IVPB 1000 mg/200 mL premix        1,000 mg 200 mL/hr over 60 Minutes Intravenous  Once 11/01/11 1127 11/01/11 1441   11/01/11 1130   cefTRIAXone (ROCEPHIN) 1 g in dextrose 5 % 50 mL IVPB        1 g 100  mL/hr over 30 Minutes Intravenous  Once 11/01/11 1127 11/01/11 1205           Assessment/Plan  1. Necrotizing soft tissue infection, s/p I&D 2. New onset a fib 3. New hypothyroidism  Plan: 1. Will plan on either home with [facility] or discharge to SNF today. 2. Follow up with Dr. Katrinka Blazing for a fib 3. Follow up with PCP for hypothyroidism 4. Follow up with Dr. Ezzard Standing in 1-2 weeks   LOS: 11 days    Gianah Batt E 11/12/2011

## 2011-11-13 ENCOUNTER — Telehealth (INDEPENDENT_AMBULATORY_CARE_PROVIDER_SITE_OTHER): Payer: Self-pay | Admitting: Surgery

## 2011-11-13 DIAGNOSIS — E039 Hypothyroidism, unspecified: Secondary | ICD-10-CM | POA: Diagnosis not present

## 2011-11-13 DIAGNOSIS — A419 Sepsis, unspecified organism: Secondary | ICD-10-CM | POA: Diagnosis not present

## 2011-11-13 DIAGNOSIS — Z7901 Long term (current) use of anticoagulants: Secondary | ICD-10-CM | POA: Diagnosis not present

## 2011-11-13 DIAGNOSIS — T8140XA Infection following a procedure, unspecified, initial encounter: Secondary | ICD-10-CM | POA: Diagnosis not present

## 2011-11-13 DIAGNOSIS — I4891 Unspecified atrial fibrillation: Secondary | ICD-10-CM | POA: Diagnosis not present

## 2011-11-13 DIAGNOSIS — I10 Essential (primary) hypertension: Secondary | ICD-10-CM | POA: Diagnosis not present

## 2011-11-14 ENCOUNTER — Telehealth (INDEPENDENT_AMBULATORY_CARE_PROVIDER_SITE_OTHER): Payer: Self-pay | Admitting: General Surgery

## 2011-11-14 DIAGNOSIS — Z7901 Long term (current) use of anticoagulants: Secondary | ICD-10-CM | POA: Diagnosis not present

## 2011-11-14 DIAGNOSIS — I4891 Unspecified atrial fibrillation: Secondary | ICD-10-CM | POA: Diagnosis not present

## 2011-11-14 NOTE — Telephone Encounter (Signed)
Patient called to make 2 week follow up with Dr Ezzard Standing post surgery for peri-rectal abscess. Please call.

## 2011-11-14 NOTE — Telephone Encounter (Signed)
I made an appt for 11/29/11.

## 2011-11-14 NOTE — Telephone Encounter (Signed)
I left a message for Mr [name] to call me.  I scheduled an appointment for 5/2 with Dr Corliss Skains.  I also called the appt info to Crystal at [facility] ext [phone].  She will try to reach the pt's husband as well.

## 2011-11-15 DIAGNOSIS — Z7901 Long term (current) use of anticoagulants: Secondary | ICD-10-CM | POA: Diagnosis not present

## 2011-11-15 DIAGNOSIS — I4891 Unspecified atrial fibrillation: Secondary | ICD-10-CM | POA: Diagnosis not present

## 2011-11-16 DIAGNOSIS — Z7901 Long term (current) use of anticoagulants: Secondary | ICD-10-CM | POA: Diagnosis not present

## 2011-11-16 DIAGNOSIS — I4891 Unspecified atrial fibrillation: Secondary | ICD-10-CM | POA: Diagnosis not present

## 2011-11-19 DIAGNOSIS — E039 Hypothyroidism, unspecified: Secondary | ICD-10-CM | POA: Diagnosis not present

## 2011-11-19 DIAGNOSIS — F329 Major depressive disorder, single episode, unspecified: Secondary | ICD-10-CM | POA: Diagnosis not present

## 2011-11-19 DIAGNOSIS — M6281 Muscle weakness (generalized): Secondary | ICD-10-CM | POA: Diagnosis not present

## 2011-11-19 DIAGNOSIS — Z7901 Long term (current) use of anticoagulants: Secondary | ICD-10-CM | POA: Diagnosis not present

## 2011-11-19 DIAGNOSIS — I10 Essential (primary) hypertension: Secondary | ICD-10-CM | POA: Diagnosis not present

## 2011-11-19 DIAGNOSIS — T8140XA Infection following a procedure, unspecified, initial encounter: Secondary | ICD-10-CM | POA: Diagnosis not present

## 2011-11-19 DIAGNOSIS — I4891 Unspecified atrial fibrillation: Secondary | ICD-10-CM | POA: Diagnosis not present

## 2011-11-20 DIAGNOSIS — I4891 Unspecified atrial fibrillation: Secondary | ICD-10-CM | POA: Diagnosis not present

## 2011-11-20 DIAGNOSIS — Z7901 Long term (current) use of anticoagulants: Secondary | ICD-10-CM | POA: Diagnosis not present

## 2011-11-21 DIAGNOSIS — I4891 Unspecified atrial fibrillation: Secondary | ICD-10-CM | POA: Diagnosis not present

## 2011-11-21 DIAGNOSIS — Z7901 Long term (current) use of anticoagulants: Secondary | ICD-10-CM | POA: Diagnosis not present

## 2011-11-22 DIAGNOSIS — I4891 Unspecified atrial fibrillation: Secondary | ICD-10-CM | POA: Diagnosis not present

## 2011-11-22 DIAGNOSIS — Z7901 Long term (current) use of anticoagulants: Secondary | ICD-10-CM | POA: Diagnosis not present

## 2011-11-23 DIAGNOSIS — Z7901 Long term (current) use of anticoagulants: Secondary | ICD-10-CM | POA: Diagnosis not present

## 2011-11-23 DIAGNOSIS — I4891 Unspecified atrial fibrillation: Secondary | ICD-10-CM | POA: Diagnosis not present

## 2011-11-26 DIAGNOSIS — Z7901 Long term (current) use of anticoagulants: Secondary | ICD-10-CM | POA: Diagnosis not present

## 2011-11-26 DIAGNOSIS — I4891 Unspecified atrial fibrillation: Secondary | ICD-10-CM | POA: Diagnosis not present

## 2011-11-27 ENCOUNTER — Encounter (INDEPENDENT_AMBULATORY_CARE_PROVIDER_SITE_OTHER): Payer: Self-pay | Admitting: Surgery

## 2011-11-27 DIAGNOSIS — E039 Hypothyroidism, unspecified: Secondary | ICD-10-CM | POA: Diagnosis not present

## 2011-11-27 DIAGNOSIS — M069 Rheumatoid arthritis, unspecified: Secondary | ICD-10-CM | POA: Diagnosis not present

## 2011-11-27 DIAGNOSIS — E785 Hyperlipidemia, unspecified: Secondary | ICD-10-CM | POA: Diagnosis not present

## 2011-11-27 DIAGNOSIS — I10 Essential (primary) hypertension: Secondary | ICD-10-CM | POA: Diagnosis not present

## 2011-11-27 DIAGNOSIS — Z7901 Long term (current) use of anticoagulants: Secondary | ICD-10-CM | POA: Diagnosis not present

## 2011-11-27 DIAGNOSIS — T8140XA Infection following a procedure, unspecified, initial encounter: Secondary | ICD-10-CM | POA: Diagnosis not present

## 2011-11-28 DIAGNOSIS — M069 Rheumatoid arthritis, unspecified: Secondary | ICD-10-CM | POA: Diagnosis not present

## 2011-11-28 DIAGNOSIS — M726 Necrotizing fasciitis: Secondary | ICD-10-CM | POA: Diagnosis not present

## 2011-11-28 DIAGNOSIS — I4891 Unspecified atrial fibrillation: Secondary | ICD-10-CM | POA: Diagnosis not present

## 2011-11-28 DIAGNOSIS — F329 Major depressive disorder, single episode, unspecified: Secondary | ICD-10-CM | POA: Diagnosis not present

## 2011-11-28 DIAGNOSIS — H543 Unqualified visual loss, both eyes: Secondary | ICD-10-CM | POA: Diagnosis not present

## 2011-11-29 ENCOUNTER — Encounter (INDEPENDENT_AMBULATORY_CARE_PROVIDER_SITE_OTHER): Payer: Self-pay | Admitting: Surgery

## 2011-11-29 ENCOUNTER — Ambulatory Visit (INDEPENDENT_AMBULATORY_CARE_PROVIDER_SITE_OTHER): Payer: Medicare Other | Admitting: Surgery

## 2011-11-29 ENCOUNTER — Telehealth (INDEPENDENT_AMBULATORY_CARE_PROVIDER_SITE_OTHER): Payer: Self-pay | Admitting: Surgery

## 2011-11-29 VITALS — BP 145/78 | HR 86 | Temp 97.5°F | Ht 67.0 in | Wt 194.6 lb

## 2011-11-29 DIAGNOSIS — R269 Unspecified abnormalities of gait and mobility: Secondary | ICD-10-CM | POA: Diagnosis not present

## 2011-11-29 DIAGNOSIS — M726 Necrotizing fasciitis: Secondary | ICD-10-CM

## 2011-11-29 DIAGNOSIS — E039 Hypothyroidism, unspecified: Secondary | ICD-10-CM | POA: Diagnosis not present

## 2011-11-29 DIAGNOSIS — I10 Essential (primary) hypertension: Secondary | ICD-10-CM | POA: Diagnosis not present

## 2011-11-29 DIAGNOSIS — Z79899 Other long term (current) drug therapy: Secondary | ICD-10-CM | POA: Diagnosis not present

## 2011-11-29 DIAGNOSIS — I4891 Unspecified atrial fibrillation: Secondary | ICD-10-CM | POA: Diagnosis not present

## 2011-11-29 NOTE — Telephone Encounter (Signed)
Huntley Dec, this was sent to me by mistake. ahs

## 2011-11-29 NOTE — Progress Notes (Signed)
I am seeing this patient for Dr. Ezzard Standing during his absence. This patient underwent wide debridement of necrotizing infection of the subcutaneous tissues and skin of the left perineum 11/01/11 by Dr. Ezzard Standing.  She was discharged to SNF on 11/12/11 and is now at home.  Home health nursing is assisting with dressing changes daily.  She still feels weak and tired, but is slowly improving.  She also reports some decreased visual acuity.    The wound is continuing to show some purulent drainage.  No surrounding cellultis.  The wound is long, running down the left side of her groin.  The wound is well-granulated with minimal undermining or tunneling.  There is a skin bridge in the middle of the wound that is closed.  The wound was repacked with saline-moistened gauze.  Continue daily dressing changes.  Follow-up 2-3 weeks with Dr. Ezzard Standing.  Wilmon Arms. Corliss Skains, MD, [facility]  11/29/2011 12:15 PM

## 2011-11-30 DIAGNOSIS — M726 Necrotizing fasciitis: Secondary | ICD-10-CM | POA: Diagnosis not present

## 2011-11-30 DIAGNOSIS — H543 Unqualified visual loss, both eyes: Secondary | ICD-10-CM | POA: Diagnosis not present

## 2011-11-30 DIAGNOSIS — I4891 Unspecified atrial fibrillation: Secondary | ICD-10-CM | POA: Diagnosis not present

## 2011-11-30 DIAGNOSIS — F329 Major depressive disorder, single episode, unspecified: Secondary | ICD-10-CM | POA: Diagnosis not present

## 2011-11-30 DIAGNOSIS — M069 Rheumatoid arthritis, unspecified: Secondary | ICD-10-CM | POA: Diagnosis not present

## 2011-12-01 DIAGNOSIS — M069 Rheumatoid arthritis, unspecified: Secondary | ICD-10-CM | POA: Diagnosis not present

## 2011-12-01 DIAGNOSIS — H543 Unqualified visual loss, both eyes: Secondary | ICD-10-CM | POA: Diagnosis not present

## 2011-12-01 DIAGNOSIS — F329 Major depressive disorder, single episode, unspecified: Secondary | ICD-10-CM | POA: Diagnosis not present

## 2011-12-01 DIAGNOSIS — M726 Necrotizing fasciitis: Secondary | ICD-10-CM | POA: Diagnosis not present

## 2011-12-01 DIAGNOSIS — I4891 Unspecified atrial fibrillation: Secondary | ICD-10-CM | POA: Diagnosis not present

## 2011-12-02 DIAGNOSIS — F329 Major depressive disorder, single episode, unspecified: Secondary | ICD-10-CM | POA: Diagnosis not present

## 2011-12-02 DIAGNOSIS — I4891 Unspecified atrial fibrillation: Secondary | ICD-10-CM | POA: Diagnosis not present

## 2011-12-02 DIAGNOSIS — H543 Unqualified visual loss, both eyes: Secondary | ICD-10-CM | POA: Diagnosis not present

## 2011-12-02 DIAGNOSIS — M726 Necrotizing fasciitis: Secondary | ICD-10-CM | POA: Diagnosis not present

## 2011-12-02 DIAGNOSIS — M069 Rheumatoid arthritis, unspecified: Secondary | ICD-10-CM | POA: Diagnosis not present

## 2011-12-03 DIAGNOSIS — F329 Major depressive disorder, single episode, unspecified: Secondary | ICD-10-CM | POA: Diagnosis not present

## 2011-12-03 DIAGNOSIS — M726 Necrotizing fasciitis: Secondary | ICD-10-CM | POA: Diagnosis not present

## 2011-12-03 DIAGNOSIS — I4891 Unspecified atrial fibrillation: Secondary | ICD-10-CM | POA: Diagnosis not present

## 2011-12-03 DIAGNOSIS — H543 Unqualified visual loss, both eyes: Secondary | ICD-10-CM | POA: Diagnosis not present

## 2011-12-03 DIAGNOSIS — M069 Rheumatoid arthritis, unspecified: Secondary | ICD-10-CM | POA: Diagnosis not present

## 2011-12-04 DIAGNOSIS — M069 Rheumatoid arthritis, unspecified: Secondary | ICD-10-CM | POA: Diagnosis not present

## 2011-12-04 DIAGNOSIS — I4891 Unspecified atrial fibrillation: Secondary | ICD-10-CM | POA: Diagnosis not present

## 2011-12-04 DIAGNOSIS — M726 Necrotizing fasciitis: Secondary | ICD-10-CM | POA: Diagnosis not present

## 2011-12-04 DIAGNOSIS — F329 Major depressive disorder, single episode, unspecified: Secondary | ICD-10-CM | POA: Diagnosis not present

## 2011-12-04 DIAGNOSIS — H543 Unqualified visual loss, both eyes: Secondary | ICD-10-CM | POA: Diagnosis not present

## 2011-12-05 DIAGNOSIS — I4891 Unspecified atrial fibrillation: Secondary | ICD-10-CM | POA: Diagnosis not present

## 2011-12-05 DIAGNOSIS — H543 Unqualified visual loss, both eyes: Secondary | ICD-10-CM | POA: Diagnosis not present

## 2011-12-05 DIAGNOSIS — M069 Rheumatoid arthritis, unspecified: Secondary | ICD-10-CM | POA: Diagnosis not present

## 2011-12-05 DIAGNOSIS — I10 Essential (primary) hypertension: Secondary | ICD-10-CM | POA: Diagnosis not present

## 2011-12-05 DIAGNOSIS — F329 Major depressive disorder, single episode, unspecified: Secondary | ICD-10-CM | POA: Diagnosis not present

## 2011-12-05 DIAGNOSIS — Z7901 Long term (current) use of anticoagulants: Secondary | ICD-10-CM | POA: Diagnosis not present

## 2011-12-05 DIAGNOSIS — M726 Necrotizing fasciitis: Secondary | ICD-10-CM | POA: Diagnosis not present

## 2011-12-06 DIAGNOSIS — H543 Unqualified visual loss, both eyes: Secondary | ICD-10-CM | POA: Diagnosis not present

## 2011-12-06 DIAGNOSIS — I4891 Unspecified atrial fibrillation: Secondary | ICD-10-CM | POA: Diagnosis not present

## 2011-12-06 DIAGNOSIS — M726 Necrotizing fasciitis: Secondary | ICD-10-CM | POA: Diagnosis not present

## 2011-12-06 DIAGNOSIS — F329 Major depressive disorder, single episode, unspecified: Secondary | ICD-10-CM | POA: Diagnosis not present

## 2011-12-06 DIAGNOSIS — M069 Rheumatoid arthritis, unspecified: Secondary | ICD-10-CM | POA: Diagnosis not present

## 2011-12-07 DIAGNOSIS — M726 Necrotizing fasciitis: Secondary | ICD-10-CM | POA: Diagnosis not present

## 2011-12-07 DIAGNOSIS — I4891 Unspecified atrial fibrillation: Secondary | ICD-10-CM | POA: Diagnosis not present

## 2011-12-07 DIAGNOSIS — M069 Rheumatoid arthritis, unspecified: Secondary | ICD-10-CM | POA: Diagnosis not present

## 2011-12-07 DIAGNOSIS — H543 Unqualified visual loss, both eyes: Secondary | ICD-10-CM | POA: Diagnosis not present

## 2011-12-07 DIAGNOSIS — F329 Major depressive disorder, single episode, unspecified: Secondary | ICD-10-CM | POA: Diagnosis not present

## 2011-12-08 DIAGNOSIS — H543 Unqualified visual loss, both eyes: Secondary | ICD-10-CM | POA: Diagnosis not present

## 2011-12-08 DIAGNOSIS — F329 Major depressive disorder, single episode, unspecified: Secondary | ICD-10-CM | POA: Diagnosis not present

## 2011-12-08 DIAGNOSIS — I4891 Unspecified atrial fibrillation: Secondary | ICD-10-CM | POA: Diagnosis not present

## 2011-12-08 DIAGNOSIS — M726 Necrotizing fasciitis: Secondary | ICD-10-CM | POA: Diagnosis not present

## 2011-12-08 DIAGNOSIS — M069 Rheumatoid arthritis, unspecified: Secondary | ICD-10-CM | POA: Diagnosis not present

## 2011-12-09 DIAGNOSIS — M069 Rheumatoid arthritis, unspecified: Secondary | ICD-10-CM | POA: Diagnosis not present

## 2011-12-09 DIAGNOSIS — F329 Major depressive disorder, single episode, unspecified: Secondary | ICD-10-CM | POA: Diagnosis not present

## 2011-12-09 DIAGNOSIS — I4891 Unspecified atrial fibrillation: Secondary | ICD-10-CM | POA: Diagnosis not present

## 2011-12-09 DIAGNOSIS — H543 Unqualified visual loss, both eyes: Secondary | ICD-10-CM | POA: Diagnosis not present

## 2011-12-09 DIAGNOSIS — M726 Necrotizing fasciitis: Secondary | ICD-10-CM | POA: Diagnosis not present

## 2011-12-10 DIAGNOSIS — H543 Unqualified visual loss, both eyes: Secondary | ICD-10-CM | POA: Diagnosis not present

## 2011-12-10 DIAGNOSIS — I4891 Unspecified atrial fibrillation: Secondary | ICD-10-CM | POA: Diagnosis not present

## 2011-12-10 DIAGNOSIS — M726 Necrotizing fasciitis: Secondary | ICD-10-CM | POA: Diagnosis not present

## 2011-12-10 DIAGNOSIS — M069 Rheumatoid arthritis, unspecified: Secondary | ICD-10-CM | POA: Diagnosis not present

## 2011-12-10 DIAGNOSIS — F329 Major depressive disorder, single episode, unspecified: Secondary | ICD-10-CM | POA: Diagnosis not present

## 2011-12-11 DIAGNOSIS — H543 Unqualified visual loss, both eyes: Secondary | ICD-10-CM | POA: Diagnosis not present

## 2011-12-11 DIAGNOSIS — M069 Rheumatoid arthritis, unspecified: Secondary | ICD-10-CM | POA: Diagnosis not present

## 2011-12-11 DIAGNOSIS — I4891 Unspecified atrial fibrillation: Secondary | ICD-10-CM | POA: Diagnosis not present

## 2011-12-11 DIAGNOSIS — F329 Major depressive disorder, single episode, unspecified: Secondary | ICD-10-CM | POA: Diagnosis not present

## 2011-12-11 DIAGNOSIS — M726 Necrotizing fasciitis: Secondary | ICD-10-CM | POA: Diagnosis not present

## 2011-12-12 DIAGNOSIS — H543 Unqualified visual loss, both eyes: Secondary | ICD-10-CM | POA: Diagnosis not present

## 2011-12-12 DIAGNOSIS — M069 Rheumatoid arthritis, unspecified: Secondary | ICD-10-CM | POA: Diagnosis not present

## 2011-12-12 DIAGNOSIS — F329 Major depressive disorder, single episode, unspecified: Secondary | ICD-10-CM | POA: Diagnosis not present

## 2011-12-12 DIAGNOSIS — M726 Necrotizing fasciitis: Secondary | ICD-10-CM | POA: Diagnosis not present

## 2011-12-12 DIAGNOSIS — I4891 Unspecified atrial fibrillation: Secondary | ICD-10-CM | POA: Diagnosis not present

## 2011-12-13 DIAGNOSIS — M726 Necrotizing fasciitis: Secondary | ICD-10-CM | POA: Diagnosis not present

## 2011-12-13 DIAGNOSIS — M069 Rheumatoid arthritis, unspecified: Secondary | ICD-10-CM | POA: Diagnosis not present

## 2011-12-13 DIAGNOSIS — I4891 Unspecified atrial fibrillation: Secondary | ICD-10-CM | POA: Diagnosis not present

## 2011-12-13 DIAGNOSIS — H543 Unqualified visual loss, both eyes: Secondary | ICD-10-CM | POA: Diagnosis not present

## 2011-12-13 DIAGNOSIS — F329 Major depressive disorder, single episode, unspecified: Secondary | ICD-10-CM | POA: Diagnosis not present

## 2011-12-14 DIAGNOSIS — I4891 Unspecified atrial fibrillation: Secondary | ICD-10-CM | POA: Diagnosis not present

## 2011-12-14 DIAGNOSIS — H543 Unqualified visual loss, both eyes: Secondary | ICD-10-CM | POA: Diagnosis not present

## 2011-12-14 DIAGNOSIS — M069 Rheumatoid arthritis, unspecified: Secondary | ICD-10-CM | POA: Diagnosis not present

## 2011-12-14 DIAGNOSIS — M726 Necrotizing fasciitis: Secondary | ICD-10-CM | POA: Diagnosis not present

## 2011-12-14 DIAGNOSIS — F329 Major depressive disorder, single episode, unspecified: Secondary | ICD-10-CM | POA: Diagnosis not present

## 2011-12-15 DIAGNOSIS — I4891 Unspecified atrial fibrillation: Secondary | ICD-10-CM | POA: Diagnosis not present

## 2011-12-15 DIAGNOSIS — H543 Unqualified visual loss, both eyes: Secondary | ICD-10-CM | POA: Diagnosis not present

## 2011-12-15 DIAGNOSIS — M726 Necrotizing fasciitis: Secondary | ICD-10-CM | POA: Diagnosis not present

## 2011-12-15 DIAGNOSIS — F329 Major depressive disorder, single episode, unspecified: Secondary | ICD-10-CM | POA: Diagnosis not present

## 2011-12-15 DIAGNOSIS — M069 Rheumatoid arthritis, unspecified: Secondary | ICD-10-CM | POA: Diagnosis not present

## 2011-12-16 DIAGNOSIS — M069 Rheumatoid arthritis, unspecified: Secondary | ICD-10-CM | POA: Diagnosis not present

## 2011-12-16 DIAGNOSIS — F329 Major depressive disorder, single episode, unspecified: Secondary | ICD-10-CM | POA: Diagnosis not present

## 2011-12-16 DIAGNOSIS — I4891 Unspecified atrial fibrillation: Secondary | ICD-10-CM | POA: Diagnosis not present

## 2011-12-16 DIAGNOSIS — M726 Necrotizing fasciitis: Secondary | ICD-10-CM | POA: Diagnosis not present

## 2011-12-16 DIAGNOSIS — H543 Unqualified visual loss, both eyes: Secondary | ICD-10-CM | POA: Diagnosis not present

## 2011-12-17 DIAGNOSIS — H543 Unqualified visual loss, both eyes: Secondary | ICD-10-CM | POA: Diagnosis not present

## 2011-12-17 DIAGNOSIS — M726 Necrotizing fasciitis: Secondary | ICD-10-CM | POA: Diagnosis not present

## 2011-12-17 DIAGNOSIS — M069 Rheumatoid arthritis, unspecified: Secondary | ICD-10-CM | POA: Diagnosis not present

## 2011-12-17 DIAGNOSIS — F329 Major depressive disorder, single episode, unspecified: Secondary | ICD-10-CM | POA: Diagnosis not present

## 2011-12-17 DIAGNOSIS — I4891 Unspecified atrial fibrillation: Secondary | ICD-10-CM | POA: Diagnosis not present

## 2011-12-18 DIAGNOSIS — M069 Rheumatoid arthritis, unspecified: Secondary | ICD-10-CM | POA: Diagnosis not present

## 2011-12-18 DIAGNOSIS — H543 Unqualified visual loss, both eyes: Secondary | ICD-10-CM | POA: Diagnosis not present

## 2011-12-18 DIAGNOSIS — M726 Necrotizing fasciitis: Secondary | ICD-10-CM | POA: Diagnosis not present

## 2011-12-18 DIAGNOSIS — I4891 Unspecified atrial fibrillation: Secondary | ICD-10-CM | POA: Diagnosis not present

## 2011-12-18 DIAGNOSIS — F329 Major depressive disorder, single episode, unspecified: Secondary | ICD-10-CM | POA: Diagnosis not present

## 2011-12-19 ENCOUNTER — Encounter (INDEPENDENT_AMBULATORY_CARE_PROVIDER_SITE_OTHER): Payer: Self-pay | Admitting: Surgery

## 2011-12-19 ENCOUNTER — Ambulatory Visit (INDEPENDENT_AMBULATORY_CARE_PROVIDER_SITE_OTHER): Payer: Medicare Other | Admitting: Surgery

## 2011-12-19 VITALS — BP 120/80 | HR 72 | Temp 97.6°F | Resp 18 | Ht 67.0 in | Wt 199.0 lb

## 2011-12-19 DIAGNOSIS — M726 Necrotizing fasciitis: Secondary | ICD-10-CM

## 2011-12-19 NOTE — Progress Notes (Signed)
[facility]  Ovidio Kin, MD,  FACS [address] [place]  [address] [place], [place]    [address] Phone:  [phone] FAX:  [phone]   Re:   KAMARI BILEK DOB:   01/04/1941 MRN:   [id number]  ASSESSMENT AND PLAN: 1.  Necrotizing fasciitis of the left perineum.   Debridement - 11/01/2011  Has done better than expected.  The wound is about 95% healed.  I made this her last visit, but I would be happy to see her if there is any question.  2. Atrial fibrillation   Seen by Dr. Mendel Ryder. 3. Hypertension.  4. History of breast cancer. 5.  On anticoagulation for the A. Fib.   HISTORY OF PRESENT ILLNESS: Chief Complaint  Patient presents with  . Wound Check   MICHIAH MASSE is a 71 y.o. (DOB: 01-20-1941)  white female who is a patient of Ginette Otto, MD, MD and comes to me today for follow up of necrotizing fasciitis of the left perineum.  She first went to a SNF, but has now been home.  HHC is coming by and changing the dressing.  Her husband is also changing the dressing.  He is here with her today.  She looks good physically.  PHYSICAL EXAM: BP 120/80  Pulse 72  Temp(Src) 97.6 F (36.4 C) (Temporal)  Resp 18  Ht 5\' 7"  (1.702 m)  Wt 199 lb (90.266 kg)  BMI 31.17 kg/m2  Abdomen/perineum:  Left labial and perineal wound that is almost healed.  The wound looks good.  DATA REVIEWED: Hospital records.  Ovidio Kin, MD, FACS Office:  [phone]

## 2011-12-20 DIAGNOSIS — F329 Major depressive disorder, single episode, unspecified: Secondary | ICD-10-CM | POA: Diagnosis not present

## 2011-12-20 DIAGNOSIS — M726 Necrotizing fasciitis: Secondary | ICD-10-CM | POA: Diagnosis not present

## 2011-12-20 DIAGNOSIS — M069 Rheumatoid arthritis, unspecified: Secondary | ICD-10-CM | POA: Diagnosis not present

## 2011-12-20 DIAGNOSIS — I4891 Unspecified atrial fibrillation: Secondary | ICD-10-CM | POA: Diagnosis not present

## 2011-12-20 DIAGNOSIS — H543 Unqualified visual loss, both eyes: Secondary | ICD-10-CM | POA: Diagnosis not present

## 2011-12-25 DIAGNOSIS — M069 Rheumatoid arthritis, unspecified: Secondary | ICD-10-CM | POA: Diagnosis not present

## 2011-12-25 DIAGNOSIS — M726 Necrotizing fasciitis: Secondary | ICD-10-CM | POA: Diagnosis not present

## 2011-12-25 DIAGNOSIS — F329 Major depressive disorder, single episode, unspecified: Secondary | ICD-10-CM | POA: Diagnosis not present

## 2011-12-25 DIAGNOSIS — I4891 Unspecified atrial fibrillation: Secondary | ICD-10-CM | POA: Diagnosis not present

## 2011-12-25 DIAGNOSIS — H543 Unqualified visual loss, both eyes: Secondary | ICD-10-CM | POA: Diagnosis not present

## 2011-12-26 DIAGNOSIS — I4891 Unspecified atrial fibrillation: Secondary | ICD-10-CM | POA: Diagnosis not present

## 2011-12-26 DIAGNOSIS — Z79899 Other long term (current) drug therapy: Secondary | ICD-10-CM | POA: Diagnosis not present

## 2011-12-26 DIAGNOSIS — I10 Essential (primary) hypertension: Secondary | ICD-10-CM | POA: Diagnosis not present

## 2011-12-26 DIAGNOSIS — E7800 Pure hypercholesterolemia, unspecified: Secondary | ICD-10-CM | POA: Diagnosis not present

## 2011-12-26 DIAGNOSIS — Z7901 Long term (current) use of anticoagulants: Secondary | ICD-10-CM | POA: Diagnosis not present

## 2011-12-28 ENCOUNTER — Other Ambulatory Visit: Payer: Self-pay | Admitting: Interventional Cardiology

## 2011-12-28 ENCOUNTER — Encounter (HOSPITAL_COMMUNITY): Payer: Self-pay

## 2011-12-28 DIAGNOSIS — I4891 Unspecified atrial fibrillation: Secondary | ICD-10-CM | POA: Diagnosis not present

## 2012-01-02 DIAGNOSIS — M069 Rheumatoid arthritis, unspecified: Secondary | ICD-10-CM | POA: Diagnosis not present

## 2012-01-02 DIAGNOSIS — M726 Necrotizing fasciitis: Secondary | ICD-10-CM | POA: Diagnosis not present

## 2012-01-02 DIAGNOSIS — I4891 Unspecified atrial fibrillation: Secondary | ICD-10-CM | POA: Diagnosis not present

## 2012-01-02 DIAGNOSIS — H543 Unqualified visual loss, both eyes: Secondary | ICD-10-CM | POA: Diagnosis not present

## 2012-01-02 DIAGNOSIS — F329 Major depressive disorder, single episode, unspecified: Secondary | ICD-10-CM | POA: Diagnosis not present

## 2012-01-04 ENCOUNTER — Encounter (HOSPITAL_COMMUNITY): Admission: RE | Payer: Self-pay | Source: Ambulatory Visit

## 2012-01-04 ENCOUNTER — Ambulatory Visit (HOSPITAL_COMMUNITY)
Admission: RE | Admit: 2012-01-04 | Payer: Medicare Other | Source: Ambulatory Visit | Admitting: Interventional Cardiology

## 2012-01-04 SURGERY — CARDIOVERSION
Anesthesia: Monitor Anesthesia Care

## 2012-01-08 DIAGNOSIS — H543 Unqualified visual loss, both eyes: Secondary | ICD-10-CM | POA: Diagnosis not present

## 2012-01-08 DIAGNOSIS — I4891 Unspecified atrial fibrillation: Secondary | ICD-10-CM | POA: Diagnosis not present

## 2012-01-08 DIAGNOSIS — F329 Major depressive disorder, single episode, unspecified: Secondary | ICD-10-CM | POA: Diagnosis not present

## 2012-01-08 DIAGNOSIS — M726 Necrotizing fasciitis: Secondary | ICD-10-CM | POA: Diagnosis not present

## 2012-01-08 DIAGNOSIS — M069 Rheumatoid arthritis, unspecified: Secondary | ICD-10-CM | POA: Diagnosis not present

## 2012-01-15 DIAGNOSIS — M069 Rheumatoid arthritis, unspecified: Secondary | ICD-10-CM | POA: Diagnosis not present

## 2012-01-15 DIAGNOSIS — I4891 Unspecified atrial fibrillation: Secondary | ICD-10-CM | POA: Diagnosis not present

## 2012-01-15 DIAGNOSIS — H543 Unqualified visual loss, both eyes: Secondary | ICD-10-CM | POA: Diagnosis not present

## 2012-01-15 DIAGNOSIS — M726 Necrotizing fasciitis: Secondary | ICD-10-CM | POA: Diagnosis not present

## 2012-01-15 DIAGNOSIS — F329 Major depressive disorder, single episode, unspecified: Secondary | ICD-10-CM | POA: Diagnosis not present

## 2012-01-23 DIAGNOSIS — Z7901 Long term (current) use of anticoagulants: Secondary | ICD-10-CM | POA: Diagnosis not present

## 2012-01-23 DIAGNOSIS — I4891 Unspecified atrial fibrillation: Secondary | ICD-10-CM | POA: Diagnosis not present

## 2012-01-24 ENCOUNTER — Encounter (HOSPITAL_COMMUNITY): Payer: Self-pay | Admitting: Pharmacy Technician

## 2012-01-24 DIAGNOSIS — E039 Hypothyroidism, unspecified: Secondary | ICD-10-CM | POA: Diagnosis not present

## 2012-01-24 DIAGNOSIS — E7800 Pure hypercholesterolemia, unspecified: Secondary | ICD-10-CM | POA: Diagnosis not present

## 2012-01-24 DIAGNOSIS — Z79899 Other long term (current) drug therapy: Secondary | ICD-10-CM | POA: Diagnosis not present

## 2012-01-24 DIAGNOSIS — Z0000 Encounter for general adult medical examination without abnormal findings: Secondary | ICD-10-CM | POA: Diagnosis not present

## 2012-01-24 DIAGNOSIS — D649 Anemia, unspecified: Secondary | ICD-10-CM | POA: Diagnosis not present

## 2012-01-27 DIAGNOSIS — Z7901 Long term (current) use of anticoagulants: Secondary | ICD-10-CM | POA: Diagnosis not present

## 2012-01-27 DIAGNOSIS — M069 Rheumatoid arthritis, unspecified: Secondary | ICD-10-CM | POA: Diagnosis not present

## 2012-01-27 DIAGNOSIS — F329 Major depressive disorder, single episode, unspecified: Secondary | ICD-10-CM | POA: Diagnosis not present

## 2012-01-27 DIAGNOSIS — Z5181 Encounter for therapeutic drug level monitoring: Secondary | ICD-10-CM | POA: Diagnosis not present

## 2012-01-27 DIAGNOSIS — I4891 Unspecified atrial fibrillation: Secondary | ICD-10-CM | POA: Diagnosis not present

## 2012-01-28 ENCOUNTER — Other Ambulatory Visit: Payer: Self-pay | Admitting: Cardiology

## 2012-01-28 DIAGNOSIS — Z7901 Long term (current) use of anticoagulants: Secondary | ICD-10-CM | POA: Diagnosis not present

## 2012-01-28 DIAGNOSIS — I4891 Unspecified atrial fibrillation: Secondary | ICD-10-CM | POA: Diagnosis not present

## 2012-01-28 NOTE — H&P (Signed)
PRE CARDIOVERSION WORK UP/LABS/HS.       HPI:  General:  [name] is George 71 yo female followed by Dr [name] with George hx of developing atrial fibrillation with RVR while in hospital 11/01/11. She has not had symptoms since discharge. Coumadin was started as an inpatient and has been followed by our coumadin clinic. Amiodarone was also started 12/05/11. Echocardiogram with EF 60-65%. She states she remains unaware that she is in the Atrial fibrillation..  Patient denies chest pain, palpitations, dizziness, syncope, swelling, nor PND. Using CPAP, occasionally will have George sensation where you feel you need to take George deep George breath.       ROS:  as noted in HPI, no GI complaints, no black or bloody BMs, no neurological changes, her legs have been swelling, problems with legs with chornic knee pain. no falls nor problems with gait.       Medical History: Chronic low back pain, Diverticulosis, Malrotation right:, Left breast cancer, Hypertension, Hypercholesterolemia goal ldl < 160, Meckel's diverticulum, chiropracter Dr. Lucretia George, gynecology-Dr. Elana George, dermatology-Dr. Lonni George, cardiology-Dr. Katrinka George, orthopedic-Dr. Madelon George, ophthalmology-Dr. 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 Felipa George. Magrinat and Dr. Jamey George, Depression, Hypothyroidism, 10/2011 atrial fibrillation, pt/inr checked by [name].        Surgical History: rectal sphincterotomy-repair , total abdominal hysterectomy-abnormal bleeding 1986, left lumpectomy-breast cancer 1999, infant-intussusception , right knee arthroscopic surgery-Dr. Madelon George 2009, colonoscopy - Dr [name] 08/16/2005, right mastectomy Dr. Derrell George 05/2010, left knee arthrocopic Dr [name] 05/2011, I/D perineum fourniers gangrene Dr [name] 10/2011.        Family History: Father: deceased 69 yrs Coronary artery disease Mother: deceased 56 yrs Dementia Brother 1: deceased 14 yrs ruptured AAA Brother2: deceased 57 yrs stroke, cad Sister 1: alive 63 yrs  Arthritis Sister 2: alive 9 yrs Arthritis        Social History:  General: History of smoking cigarettes: Never smoked. no Smoking. Alcohol: yes, Rare. Caffeine: yes. no Recreational drug use. Occupation: unemployed, Retired Haematologist. Marital Status: married.  Moved to the [place] from [place] at age 37 grew up in [place].       Medications: Amiodarone HCl 200 MG Tablet 2 tablet Once George day, VESIcare 10 MG Tablet TAKE 1 TABLET BY MOUTH ONCE George DAY as needed, Lipitor 40 MG Tablet 1 tablet once George day---followed by Dr [name], Colace 100 MG Capsule 1 capsule as needed once George day, Tums 500 MG Tablet Chewable one tablet once George day, Multiple Vitamin Tablet one tablet once George day, MiraLax Powder as directed , Hydrochlorothiazide 25 MG Tablet TAKE 1 TABLET ONCE George DAY , Diltiazem HCl ER Beads 180 MG Capsule Extended Release 24 Hour 1 capsule Once George day, Aspir-81 81 MG Tablet Delayed Release 1 tablet Once George day, K-Dur 20 meq Tablet 2 tablets daily, Warfarin Sodium 2.5 MG Tablet 3 tablets Once George day or as directed, Paroxetine HCl 40 MG Tablet TAKE 1 TABLET ONCE George DAY , Levothyroxine Sodium 25 MCG Tablet 1 tablet every morning on an empty stomach Once George day, Medication List reviewed and reconciled with the patient       Allergies: Benazepril HCl: cough, Diclofenac Sodium: oral swelling.       Objective:     Vitals: Wt 201.8, Wt change 5.6 lb, Ht 67, BMI 31.60, Pulse sitting 72, BP sitting 144/80.       Examination:  Cardiology Exam:  GENERAL APPEARANCE: pleasant, NAD, comfortable, with mild pallor. HEENT: normal. CAROTID UPSTROKE: no bruit,  upstrokes intact. JVD: flat. HEART: irregularly irregular rate and rhythm, normal S1S2, no rub, no gallop, or click. LUNGS: clear to auscultation, no wheezing/rhonchi/rales. ABDOMEN: soft, non-tender, no hepatomegaly, no masses palpated. EXTREMITIES: no leg edema. PERIPHERAL PULSES: 2+, bilateral. NEUROLOGIC: grossly intact, cranial nerves intact, gait WNL.  MOOD: normal.        Assessment:     Assessment:  1. Atrial fibrillation - 427.31 (Primary)    Plan:     1. Atrial fibrillation Continue Amiodarone HCl Tablet, 200 MG, 2 tablet, Orally, Once George day ; Continue Diltiazem HCl ER Beads Capsule Extended Release 24 Hour, 180 MG, 1 capsule, Orally, Once George day ; Continue Aspir-81 Tablet Delayed Release, 81 MG, 1 tablet, Orally, Once George day ; Continue K-Dur Tablet, 20 meq, 2 tablets, Orally, daily ; Continue Warfarin Sodium Tablet, 2.5 MG, 3 tablets, Orally, Once George day or as directed .  LAB: CBC with Diff    WBC 8.1 4.0-11.0 - K/ul    RBC 3.83 4.20-5.40 - M/uL L   HGB 11.4 12.0-16.0 - g/dL L   HCT 16.1 09.6-04.5 - % L   MCH 29.8 27.0-33.0 - pg    MPV 8.1 7.5-10.7 - fL    MCV 88.5 81.0-99.0 - fL    MCHC 33.6 32.0-36.0 - g/dL    RDW 40.9 81.1-91.4 - % H   NRBC# 0.00 -    PLT 325 150-400 - K/uL    NEUT % 74.4 43.3-71.9 - % H   NRBC% 0.00 - %    LYMPH% 9.2 16.8-43.5 - % L   MONO % 11.3 4.6-12.4 - %    EOS % 4.4 0.0-7.8 - %    BASO % 0.7 0.0-1.0 - %    NEUT # 6.0 1.9-7.2 - K/uL    LYMPH# 0.70 1.10-2.70 - K/uL L   MONO # 0.9 0.3-0.8 - K/uL H   EOS # 0.4 0.0-0.6 - K/uL    BASO # 0.1 0.0-0.1 - K/uL     [name] 01/28/2012 01:12:11 PM > ok for cardioversion    LAB: Comp Metabolic Panel    GLUCOSE 95 70-99 - mg/dL    BUN 19 7-82 - mg/dL    CREATININE 9.56 2.13-0.86 - mg/dl    eGFR (NON-AFRICAN AMERICAN) 58 >60 - calc L   eGFR (AFRICAN AMERICAN) 70 >60 - calc    SODIUM 138 136-145 - mmol/L    POTASSIUM 3.9 3.5-5.5 - mmol/L    CHLORIDE 101 98-107 - mmol/L    C02 30 22-32 - mg/dL    ANION GAP 57.8 4.6-96.2 - mmol/L    CALCIUM 9.6 8.6-10.3 - mg/dL    T PROTEIN 6.8 9.5-2.8 - g/dL    ALBUMIN 3.9 4.1-3.2 - g/dL    T.BILI 0.4 0.3-1.0 - mg/dL    ALP 91 44-010 - U/L    AST 26 0-39 - U/L    ALT 32 0-52 - U/L     [name] 01/28/2012 01:12:34 PM > ok for cardioversion    Diagnostic Imaging:EKG Atrial fibrillation rate 84,  [name] 01/28/2012 08:26:40 AM >  Plan to proceed with Cardioversion, Reviewed indication and nature of electrical cardioversion, including the risk of stroke, skin burn, mechanical injury, cardiac arrest, and death. The incidence of life threatening arrhythmias is less than 1% He is aware that an anesthesiologist will provide sedation/anesthesia.  INRS reviewed with [facility] and have been therapeutic for upcoming DCCV. INR today 2.2.  Immunizations:        Labs:        Procedure Codes: 16109 EKG I AND R, 85025 ECL CBC PLATELET DIFF, 80053 ECL COMP METABOLIC PANEL, 36415 BLOOD COLLECTION ROUTINE VENIPUNCTURE       Preventive:         Follow Up: HS pending cardioversion (Reason: Atrial fibrillation)      Provider: Michaell Cowing. Emelda Fear, NP  Patient: [name], [name] DOB: 1940-09-26 Date: 01/28/2012

## 2012-01-29 ENCOUNTER — Other Ambulatory Visit: Payer: Self-pay | Admitting: Interventional Cardiology

## 2012-02-01 DIAGNOSIS — M069 Rheumatoid arthritis, unspecified: Secondary | ICD-10-CM | POA: Diagnosis not present

## 2012-02-01 DIAGNOSIS — Z7901 Long term (current) use of anticoagulants: Secondary | ICD-10-CM | POA: Diagnosis not present

## 2012-02-01 DIAGNOSIS — Z5181 Encounter for therapeutic drug level monitoring: Secondary | ICD-10-CM | POA: Diagnosis not present

## 2012-02-01 DIAGNOSIS — F329 Major depressive disorder, single episode, unspecified: Secondary | ICD-10-CM | POA: Diagnosis not present

## 2012-02-01 DIAGNOSIS — I4891 Unspecified atrial fibrillation: Secondary | ICD-10-CM | POA: Diagnosis not present

## 2012-02-04 ENCOUNTER — Ambulatory Visit (HOSPITAL_COMMUNITY)
Admission: RE | Admit: 2012-02-04 | Discharge: 2012-02-04 | Disposition: A | Discharge status: Home / Self Care | Payer: Medicare Other | Source: Ambulatory Visit | Attending: Interventional Cardiology | Admitting: Interventional Cardiology

## 2012-02-04 ENCOUNTER — Encounter (HOSPITAL_COMMUNITY)
Admission: RE | Disposition: A | Discharge status: Home / Self Care | Payer: Self-pay | Source: Ambulatory Visit | Attending: Interventional Cardiology

## 2012-02-04 ENCOUNTER — Encounter (HOSPITAL_COMMUNITY): Payer: Self-pay | Admitting: Anesthesiology

## 2012-02-04 ENCOUNTER — Ambulatory Visit (HOSPITAL_COMMUNITY): Payer: Medicare Other | Admitting: Anesthesiology

## 2012-02-04 DIAGNOSIS — I10 Essential (primary) hypertension: Secondary | ICD-10-CM | POA: Insufficient documentation

## 2012-02-04 DIAGNOSIS — G4730 Sleep apnea, unspecified: Secondary | ICD-10-CM | POA: Diagnosis not present

## 2012-02-04 DIAGNOSIS — I4891 Unspecified atrial fibrillation: Secondary | ICD-10-CM | POA: Diagnosis not present

## 2012-02-04 DIAGNOSIS — E039 Hypothyroidism, unspecified: Secondary | ICD-10-CM | POA: Diagnosis not present

## 2012-02-04 HISTORY — PX: CARDIOVERSION: SHX1299

## 2012-02-04 SURGERY — CARDIOVERSION
Anesthesia: General | Wound class: Clean

## 2012-02-04 MED ORDER — AMIODARONE HCL 200 MG PO TABS: 200.0000 mg | ORAL_TABLET | Freq: Every day | ORAL | Status: DC

## 2012-02-04 MED ORDER — SODIUM CHLORIDE 0.9 % IV SOLN: INTRAVENOUS | Status: DC

## 2012-02-04 MED ORDER — DILTIAZEM HCL ER 180 MG PO CP24: 180.0000 mg | ORAL_CAPSULE | Freq: Every day | ORAL | Status: DC

## 2012-02-04 MED ORDER — PROPOFOL 10 MG/ML IV BOLUS
INTRAVENOUS | Status: DC | PRN
  Administered 2012-02-04: 80 mg via INTRAVENOUS

## 2012-02-04 MED ORDER — LIDOCAINE HCL (CARDIAC) 20 MG/ML IV SOLN
INTRAVENOUS | Status: DC | PRN
  Administered 2012-02-04: 100 mg via INTRAVENOUS

## 2012-02-04 NOTE — Preoperative (Signed)
Beta Blockers   Reason not to administer Beta Blockers:Not Applicable 

## 2012-02-04 NOTE — Anesthesia Postprocedure Evaluation (Signed)
  Anesthesia Post-op Note  Patient: [name]  Procedure(s) Performed: Procedure(s) (LRB): CARDIOVERSION (N/A)  Patient Location: Short Stay  Anesthesia Type: General  Level of Consciousness: awake, alert  and oriented  Airway and Oxygen Therapy: Patient Spontanous Breathing  Post-op Pain: none  Post-op Assessment: Post-op Vital signs reviewed, Patient's Cardiovascular Status Stable, Respiratory Function Stable, Patent Airway, No signs of Nausea or vomiting, Adequate PO intake, Pain level controlled, No headache, No backache, No residual numbness and No residual motor weakness  Post-op Vital Signs: Reviewed and stable  Complications: No apparent anesthesia complications

## 2012-02-04 NOTE — CV Procedure (Signed)
Electrical Cardioversion Procedure Note [name] [id number] Jul 02, 1941  Procedure: Electrical Cardioversion Indications:  Atrial Fibrillation  Time Out: Verified patient identification, verified procedure,medications/allergies/relevent history reviewed, required imaging and test results available.  Performed  Procedure Details  The patient was NPO after midnight. Anesthesia was administered at the beside  by Dr.Singer with 80mg  of propofol.  Cardioversion was done with synchronized biphasic defibrillation with AP pads with 200watts.  The patient converted to normal sinus rhythm. The patient tolerated the procedure well   IMPRESSION:  Successful cardioversion of atrial fibrillation to NSR.    Lesleigh Noe 02/04/2012, 1:35 PM

## 2012-02-04 NOTE — Anesthesia Preprocedure Evaluation (Addendum)
Anesthesia Evaluation  Patient identified by MRN, date of birth, ID band Patient awake    Reviewed: Allergy & Precautions, H&P , NPO status , Patient's Chart, lab work & pertinent test results  History of Anesthesia Complications Negative for: history of anesthetic complications  Airway Mallampati: II TM Distance: >3 FB Neck ROM: Full    Dental  (+) Teeth Intact and Dental Advisory Given   Pulmonary sleep apnea and Continuous Positive Airway Pressure Ventilation ,  breath sounds clear to auscultation        Cardiovascular hypertension, Pt. on medications + dysrhythmias Atrial Fibrillation Rhythm:Irregular     Neuro/Psych negative neurological ROS  negative psych ROS   GI/Hepatic negative GI ROS, Neg liver ROS,   Endo/Other  Hypothyroidism   Renal/GU negative Renal ROS     Musculoskeletal negative musculoskeletal ROS (+)   Abdominal   Peds  Hematology negative hematology ROS (+)   Anesthesia Other Findings   Reproductive/Obstetrics negative OB ROS                         Anesthesia Physical Anesthesia Plan  ASA: III  Anesthesia Plan: MAC   Post-op Pain Management:    Induction: Intravenous  Airway Management Planned: Mask  Additional Equipment:   Intra-op Plan:   Post-operative Plan: Extubation in OR  Informed Consent: I have reviewed the patients History and Physical, chart, labs and discussed the procedure including the risks, benefits and alternatives for the proposed anesthesia with the patient or authorized representative who has indicated his/her understanding and acceptance.   Dental advisory given  Plan Discussed with: CRNA, Anesthesiologist and Surgeon  Anesthesia Plan Comments:         Anesthesia Quick Evaluation

## 2012-02-04 NOTE — Transfer of Care (Signed)
Immediate Anesthesia Transfer of Care Note  Patient: [name]  Procedure(s) Performed: Procedure(s) (LRB): CARDIOVERSION (N/A)  Patient Location: Short Stay  Anesthesia Type: General  Level of Consciousness: awake, alert  and oriented  Airway & Oxygen Therapy: Patient Spontanous Breathing and Patient connected to nasal cannula oxygen  Post-op Assessment: Report given to PACU RN and Post -op Vital signs reviewed and stable  Post vital signs: Reviewed and stable  Complications: No apparent anesthesia complications

## 2012-02-04 NOTE — Progress Notes (Signed)
1340 assumed care of patient. Awake and alert with out complaints.  Spouse at bed side.  Dr Katrinka Blazing in to see patient.   1350 02 discontinued.  Does not wish to take po fluids at this time

## 2012-02-04 NOTE — H&P (Signed)
This is updated history and physical. The patient developed atrial fibrillation while hospitalized with a gastrointestinal emergency in May. She has been anticoagulated and loaded with amiodarone. INR is 2.7. LV function was normal by echo. She has exertional fatigue. She denies dyspnea. Elective cardioversion is being performed to reestablish sinus rhythm. No new complaints. This is a late entry.

## 2012-02-05 ENCOUNTER — Encounter (HOSPITAL_COMMUNITY): Payer: Self-pay | Admitting: Interventional Cardiology

## 2012-02-05 DIAGNOSIS — M069 Rheumatoid arthritis, unspecified: Secondary | ICD-10-CM | POA: Diagnosis not present

## 2012-02-05 DIAGNOSIS — Z5181 Encounter for therapeutic drug level monitoring: Secondary | ICD-10-CM | POA: Diagnosis not present

## 2012-02-05 DIAGNOSIS — Z7901 Long term (current) use of anticoagulants: Secondary | ICD-10-CM | POA: Diagnosis not present

## 2012-02-05 DIAGNOSIS — I4891 Unspecified atrial fibrillation: Secondary | ICD-10-CM | POA: Diagnosis not present

## 2012-02-05 DIAGNOSIS — F329 Major depressive disorder, single episode, unspecified: Secondary | ICD-10-CM | POA: Diagnosis not present

## 2012-02-08 DIAGNOSIS — M069 Rheumatoid arthritis, unspecified: Secondary | ICD-10-CM | POA: Diagnosis not present

## 2012-02-08 DIAGNOSIS — I4891 Unspecified atrial fibrillation: Secondary | ICD-10-CM | POA: Diagnosis not present

## 2012-02-08 DIAGNOSIS — Z5181 Encounter for therapeutic drug level monitoring: Secondary | ICD-10-CM | POA: Diagnosis not present

## 2012-02-08 DIAGNOSIS — F329 Major depressive disorder, single episode, unspecified: Secondary | ICD-10-CM | POA: Diagnosis not present

## 2012-02-08 DIAGNOSIS — Z7901 Long term (current) use of anticoagulants: Secondary | ICD-10-CM | POA: Diagnosis not present

## 2012-02-12 DIAGNOSIS — I4891 Unspecified atrial fibrillation: Secondary | ICD-10-CM | POA: Diagnosis not present

## 2012-02-12 DIAGNOSIS — I10 Essential (primary) hypertension: Secondary | ICD-10-CM | POA: Diagnosis not present

## 2012-02-14 DIAGNOSIS — Z7901 Long term (current) use of anticoagulants: Secondary | ICD-10-CM | POA: Diagnosis not present

## 2012-02-14 DIAGNOSIS — M069 Rheumatoid arthritis, unspecified: Secondary | ICD-10-CM | POA: Diagnosis not present

## 2012-02-14 DIAGNOSIS — I4891 Unspecified atrial fibrillation: Secondary | ICD-10-CM | POA: Diagnosis not present

## 2012-02-14 DIAGNOSIS — F329 Major depressive disorder, single episode, unspecified: Secondary | ICD-10-CM | POA: Diagnosis not present

## 2012-02-14 DIAGNOSIS — H26499 Other secondary cataract, unspecified eye: Secondary | ICD-10-CM | POA: Diagnosis not present

## 2012-02-14 DIAGNOSIS — Z5181 Encounter for therapeutic drug level monitoring: Secondary | ICD-10-CM | POA: Diagnosis not present

## 2012-02-26 DIAGNOSIS — M069 Rheumatoid arthritis, unspecified: Secondary | ICD-10-CM | POA: Diagnosis not present

## 2012-02-26 DIAGNOSIS — F329 Major depressive disorder, single episode, unspecified: Secondary | ICD-10-CM | POA: Diagnosis not present

## 2012-02-26 DIAGNOSIS — Z5181 Encounter for therapeutic drug level monitoring: Secondary | ICD-10-CM | POA: Diagnosis not present

## 2012-02-26 DIAGNOSIS — I4891 Unspecified atrial fibrillation: Secondary | ICD-10-CM | POA: Diagnosis not present

## 2012-02-26 DIAGNOSIS — Z7901 Long term (current) use of anticoagulants: Secondary | ICD-10-CM | POA: Diagnosis not present

## 2012-03-07 DIAGNOSIS — I4891 Unspecified atrial fibrillation: Secondary | ICD-10-CM | POA: Diagnosis not present

## 2012-03-07 DIAGNOSIS — Z5181 Encounter for therapeutic drug level monitoring: Secondary | ICD-10-CM | POA: Diagnosis not present

## 2012-03-07 DIAGNOSIS — M069 Rheumatoid arthritis, unspecified: Secondary | ICD-10-CM | POA: Diagnosis not present

## 2012-03-07 DIAGNOSIS — Z7901 Long term (current) use of anticoagulants: Secondary | ICD-10-CM | POA: Diagnosis not present

## 2012-03-07 DIAGNOSIS — F329 Major depressive disorder, single episode, unspecified: Secondary | ICD-10-CM | POA: Diagnosis not present

## 2012-03-14 DIAGNOSIS — F329 Major depressive disorder, single episode, unspecified: Secondary | ICD-10-CM | POA: Diagnosis not present

## 2012-03-14 DIAGNOSIS — Z7901 Long term (current) use of anticoagulants: Secondary | ICD-10-CM | POA: Diagnosis not present

## 2012-03-14 DIAGNOSIS — Z5181 Encounter for therapeutic drug level monitoring: Secondary | ICD-10-CM | POA: Diagnosis not present

## 2012-03-14 DIAGNOSIS — I4891 Unspecified atrial fibrillation: Secondary | ICD-10-CM | POA: Diagnosis not present

## 2012-03-14 DIAGNOSIS — M069 Rheumatoid arthritis, unspecified: Secondary | ICD-10-CM | POA: Diagnosis not present

## 2012-03-17 DIAGNOSIS — IMO0002 Reserved for concepts with insufficient information to code with codable children: Secondary | ICD-10-CM | POA: Diagnosis not present

## 2012-03-17 DIAGNOSIS — S83289A Other tear of lateral meniscus, current injury, unspecified knee, initial encounter: Secondary | ICD-10-CM | POA: Diagnosis not present

## 2012-03-21 DIAGNOSIS — M069 Rheumatoid arthritis, unspecified: Secondary | ICD-10-CM | POA: Diagnosis not present

## 2012-03-21 DIAGNOSIS — F329 Major depressive disorder, single episode, unspecified: Secondary | ICD-10-CM | POA: Diagnosis not present

## 2012-03-21 DIAGNOSIS — I4891 Unspecified atrial fibrillation: Secondary | ICD-10-CM | POA: Diagnosis not present

## 2012-03-21 DIAGNOSIS — Z5181 Encounter for therapeutic drug level monitoring: Secondary | ICD-10-CM | POA: Diagnosis not present

## 2012-03-21 DIAGNOSIS — Z7901 Long term (current) use of anticoagulants: Secondary | ICD-10-CM | POA: Diagnosis not present

## 2012-03-25 DIAGNOSIS — E039 Hypothyroidism, unspecified: Secondary | ICD-10-CM | POA: Diagnosis not present

## 2012-03-26 DIAGNOSIS — I10 Essential (primary) hypertension: Secondary | ICD-10-CM | POA: Diagnosis not present

## 2012-03-26 DIAGNOSIS — E039 Hypothyroidism, unspecified: Secondary | ICD-10-CM | POA: Diagnosis not present

## 2012-03-26 DIAGNOSIS — R32 Unspecified urinary incontinence: Secondary | ICD-10-CM | POA: Diagnosis not present

## 2012-03-26 DIAGNOSIS — Z7901 Long term (current) use of anticoagulants: Secondary | ICD-10-CM | POA: Diagnosis not present

## 2012-03-26 DIAGNOSIS — Z79899 Other long term (current) drug therapy: Secondary | ICD-10-CM | POA: Diagnosis not present

## 2012-03-26 DIAGNOSIS — I4891 Unspecified atrial fibrillation: Secondary | ICD-10-CM | POA: Diagnosis not present

## 2012-04-03 DIAGNOSIS — M1710 Unilateral primary osteoarthritis, unspecified knee: Secondary | ICD-10-CM | POA: Diagnosis not present

## 2012-04-03 DIAGNOSIS — M25569 Pain in unspecified knee: Secondary | ICD-10-CM | POA: Diagnosis not present

## 2012-04-10 DIAGNOSIS — I4891 Unspecified atrial fibrillation: Secondary | ICD-10-CM | POA: Diagnosis not present

## 2012-04-10 DIAGNOSIS — Z7901 Long term (current) use of anticoagulants: Secondary | ICD-10-CM | POA: Diagnosis not present

## 2012-04-25 DIAGNOSIS — J40 Bronchitis, not specified as acute or chronic: Secondary | ICD-10-CM | POA: Diagnosis not present

## 2012-04-28 DIAGNOSIS — E039 Hypothyroidism, unspecified: Secondary | ICD-10-CM | POA: Diagnosis not present

## 2012-05-08 DIAGNOSIS — I4891 Unspecified atrial fibrillation: Secondary | ICD-10-CM | POA: Diagnosis not present

## 2012-05-08 DIAGNOSIS — Z7901 Long term (current) use of anticoagulants: Secondary | ICD-10-CM | POA: Diagnosis not present

## 2012-05-15 DIAGNOSIS — H26499 Other secondary cataract, unspecified eye: Secondary | ICD-10-CM | POA: Diagnosis not present

## 2012-05-15 DIAGNOSIS — Z961 Presence of intraocular lens: Secondary | ICD-10-CM | POA: Diagnosis not present

## 2012-05-20 DIAGNOSIS — I4891 Unspecified atrial fibrillation: Secondary | ICD-10-CM | POA: Diagnosis not present

## 2012-05-20 DIAGNOSIS — E7800 Pure hypercholesterolemia, unspecified: Secondary | ICD-10-CM | POA: Diagnosis not present

## 2012-05-20 DIAGNOSIS — Z7901 Long term (current) use of anticoagulants: Secondary | ICD-10-CM | POA: Diagnosis not present

## 2012-05-20 DIAGNOSIS — I10 Essential (primary) hypertension: Secondary | ICD-10-CM | POA: Diagnosis not present

## 2012-05-29 DIAGNOSIS — I4891 Unspecified atrial fibrillation: Secondary | ICD-10-CM | POA: Diagnosis not present

## 2012-05-29 DIAGNOSIS — Z7901 Long term (current) use of anticoagulants: Secondary | ICD-10-CM | POA: Diagnosis not present

## 2012-06-04 DIAGNOSIS — Z853 Personal history of malignant neoplasm of breast: Secondary | ICD-10-CM | POA: Diagnosis not present

## 2012-06-11 DIAGNOSIS — H26499 Other secondary cataract, unspecified eye: Secondary | ICD-10-CM | POA: Diagnosis not present

## 2012-06-17 DIAGNOSIS — Z7901 Long term (current) use of anticoagulants: Secondary | ICD-10-CM | POA: Diagnosis not present

## 2012-06-17 DIAGNOSIS — I4891 Unspecified atrial fibrillation: Secondary | ICD-10-CM | POA: Diagnosis not present

## 2012-06-23 DIAGNOSIS — E039 Hypothyroidism, unspecified: Secondary | ICD-10-CM | POA: Diagnosis not present

## 2012-07-04 DIAGNOSIS — I872 Venous insufficiency (chronic) (peripheral): Secondary | ICD-10-CM | POA: Diagnosis not present

## 2012-07-04 DIAGNOSIS — K5900 Constipation, unspecified: Secondary | ICD-10-CM | POA: Diagnosis not present

## 2012-07-04 DIAGNOSIS — I10 Essential (primary) hypertension: Secondary | ICD-10-CM | POA: Diagnosis not present

## 2012-07-04 DIAGNOSIS — K921 Melena: Secondary | ICD-10-CM | POA: Diagnosis not present

## 2012-07-08 DIAGNOSIS — I10 Essential (primary) hypertension: Secondary | ICD-10-CM | POA: Diagnosis not present

## 2012-07-08 DIAGNOSIS — E782 Mixed hyperlipidemia: Secondary | ICD-10-CM | POA: Diagnosis not present

## 2012-07-08 DIAGNOSIS — Z7901 Long term (current) use of anticoagulants: Secondary | ICD-10-CM | POA: Diagnosis not present

## 2012-07-08 DIAGNOSIS — I4891 Unspecified atrial fibrillation: Secondary | ICD-10-CM | POA: Diagnosis not present

## 2012-08-28 DIAGNOSIS — I10 Essential (primary) hypertension: Secondary | ICD-10-CM | POA: Diagnosis not present

## 2012-08-28 DIAGNOSIS — M1710 Unilateral primary osteoarthritis, unspecified knee: Secondary | ICD-10-CM | POA: Diagnosis not present

## 2012-08-28 DIAGNOSIS — R609 Edema, unspecified: Secondary | ICD-10-CM | POA: Diagnosis not present

## 2012-08-28 DIAGNOSIS — Z79899 Other long term (current) drug therapy: Secondary | ICD-10-CM | POA: Diagnosis not present

## 2012-09-04 DIAGNOSIS — I10 Essential (primary) hypertension: Secondary | ICD-10-CM | POA: Diagnosis not present

## 2012-09-04 DIAGNOSIS — Z79899 Other long term (current) drug therapy: Secondary | ICD-10-CM | POA: Diagnosis not present

## 2012-09-19 DIAGNOSIS — I10 Essential (primary) hypertension: Secondary | ICD-10-CM | POA: Diagnosis not present

## 2012-09-23 ENCOUNTER — Other Ambulatory Visit: Payer: Self-pay | Admitting: Physician Assistant

## 2012-09-23 ENCOUNTER — Encounter (HOSPITAL_COMMUNITY): Payer: Self-pay | Admitting: Pharmacy Technician

## 2012-09-24 DIAGNOSIS — R609 Edema, unspecified: Secondary | ICD-10-CM | POA: Diagnosis not present

## 2012-09-24 DIAGNOSIS — I10 Essential (primary) hypertension: Secondary | ICD-10-CM | POA: Diagnosis not present

## 2012-09-25 ENCOUNTER — Encounter (HOSPITAL_COMMUNITY)
Admission: RE | Admit: 2012-09-25 | Discharge: 2012-09-25 | Disposition: A | Discharge status: Home / Self Care | Payer: Medicare Other | Source: Ambulatory Visit | Attending: Physician Assistant | Admitting: Physician Assistant

## 2012-09-25 ENCOUNTER — Encounter (HOSPITAL_COMMUNITY)
Admission: RE | Admit: 2012-09-25 | Discharge: 2012-09-25 | Disposition: A | Discharge status: Home / Self Care | Payer: Medicare Other | Source: Ambulatory Visit | Attending: Orthopedic Surgery | Admitting: Orthopedic Surgery

## 2012-09-25 ENCOUNTER — Encounter (HOSPITAL_COMMUNITY): Payer: Self-pay

## 2012-09-25 DIAGNOSIS — M1710 Unilateral primary osteoarthritis, unspecified knee: Secondary | ICD-10-CM | POA: Diagnosis not present

## 2012-09-25 DIAGNOSIS — S058X9A Other injuries of unspecified eye and orbit, initial encounter: Secondary | ICD-10-CM | POA: Diagnosis not present

## 2012-09-25 DIAGNOSIS — Z853 Personal history of malignant neoplasm of breast: Secondary | ICD-10-CM | POA: Diagnosis not present

## 2012-09-25 DIAGNOSIS — Z01818 Encounter for other preprocedural examination: Secondary | ICD-10-CM | POA: Diagnosis not present

## 2012-09-25 DIAGNOSIS — Z7982 Long term (current) use of aspirin: Secondary | ICD-10-CM | POA: Diagnosis not present

## 2012-09-25 DIAGNOSIS — G4730 Sleep apnea, unspecified: Secondary | ICD-10-CM | POA: Diagnosis present

## 2012-09-25 DIAGNOSIS — Z01812 Encounter for preprocedural laboratory examination: Secondary | ICD-10-CM | POA: Diagnosis not present

## 2012-09-25 DIAGNOSIS — Z79899 Other long term (current) drug therapy: Secondary | ICD-10-CM | POA: Diagnosis not present

## 2012-09-25 DIAGNOSIS — G8918 Other acute postprocedural pain: Secondary | ICD-10-CM | POA: Diagnosis not present

## 2012-09-25 DIAGNOSIS — E039 Hypothyroidism, unspecified: Secondary | ICD-10-CM | POA: Diagnosis present

## 2012-09-25 DIAGNOSIS — E785 Hyperlipidemia, unspecified: Secondary | ICD-10-CM | POA: Diagnosis not present

## 2012-09-25 DIAGNOSIS — I10 Essential (primary) hypertension: Secondary | ICD-10-CM | POA: Diagnosis not present

## 2012-09-25 DIAGNOSIS — Z7901 Long term (current) use of anticoagulants: Secondary | ICD-10-CM | POA: Diagnosis not present

## 2012-09-25 DIAGNOSIS — Z96659 Presence of unspecified artificial knee joint: Secondary | ICD-10-CM | POA: Diagnosis not present

## 2012-09-25 DIAGNOSIS — I4891 Unspecified atrial fibrillation: Secondary | ICD-10-CM | POA: Diagnosis not present

## 2012-09-25 HISTORY — DX: Other specified postprocedural states: Z98.890

## 2012-09-25 HISTORY — DX: Hypothyroidism, unspecified: E03.9

## 2012-09-25 HISTORY — DX: Personal history of other medical treatment: Z92.89

## 2012-09-25 LAB — COMPREHENSIVE METABOLIC PANEL
AST: 23 U/L (ref 0–37)
BUN: 17 mg/dL (ref 6–23)
CO2: 32 mEq/L (ref 19–32)
Chloride: 102 mEq/L (ref 96–112)
Creatinine, Ser: 0.81 mg/dL (ref 0.50–1.10)
GFR calc Af Amer: 82 mL/min — ABNORMAL LOW (ref >90)
GFR calc non Af Amer: 70 mL/min — ABNORMAL LOW (ref >90)
Glucose, Bld: 66 mg/dL — ABNORMAL LOW (ref 70–99)
Total Bilirubin: 0.2 mg/dL — ABNORMAL LOW (ref 0.3–1.2)

## 2012-09-25 LAB — URINALYSIS, ROUTINE W REFLEX MICROSCOPIC
Glucose, UA: NEGATIVE mg/dL
Ketones, ur: NEGATIVE mg/dL
pH: 6 (ref 5.0–8.0)

## 2012-09-25 LAB — URINE MICROSCOPIC-ADD ON

## 2012-09-25 LAB — CBC WITH DIFFERENTIAL/PLATELET
Basophils Absolute: 0 10*3/uL (ref 0.0–0.1)
HCT: 37.2 % (ref 36.0–46.0)
Hemoglobin: 12.5 g/dL (ref 12.0–15.0)
Lymphocytes Relative: 30 % (ref 12–46)
Lymphs Abs: 2.3 10*3/uL (ref 0.7–4.0)
Monocytes Absolute: 1 10*3/uL (ref 0.1–1.0)
Monocytes Relative: 12 % (ref 3–12)
Neutro Abs: 4.2 10*3/uL (ref 1.7–7.7)
RBC: 4.35 MIL/uL (ref 3.87–5.11)
WBC: 7.6 10*3/uL (ref 4.0–10.5)

## 2012-09-25 LAB — PROTIME-INR: INR: 0.97 (ref 0.00–1.49)

## 2012-09-25 LAB — SURGICAL PCR SCREEN: MRSA, PCR: NEGATIVE

## 2012-09-25 LAB — APTT: aPTT: 34 seconds (ref 24–37)

## 2012-09-25 NOTE — Pre-Procedure Instructions (Signed)
Allyssia Skluzacek ZOXWRUE  09/25/2012   Your procedure is scheduled on: Friday, March 7th   Report to [facility] at  5:30 AM.  Call this number if you have problems the morning of surgery: [phone]   Remember:   Do not eat food or drink liquids after midnight Thursday.   Take these medicines the morning of surgery with A SIP OF WATER:  Synthroid, paxil   Do not wear jewelry, make-up or nail polish.  Do not wear lotions, powders, or perfumes. You may NOT wear deodorant.  Do not shave underarms nor legs 48 hours prior to surgery.               Do not bring valuables to the hospital.              Contacts, dentures or bridgework may not be worn into surgery.  Leave suitcase in the car. After surgery it may be brought to your room.   For patients admitted to the hospital, checkout time is 11:00 AM the day of discharge.   Patients discharged the day of surgery will not be allowed to drive home.   Name and phone number of your driver:  RUDY  Hergert   Special Instructions: Shower using CHG 2 nights before surgery and the night before surgery.  If you shower the day of surgery use CHG.  Use special wash - you have one bottle of CHG for all showers.  You should use approximately 1/3 of the bottle for each shower.   Please read over the following fact sheets that you were given: Pain Booklet, Coughing and Deep Breathing, Blood Transfusion Information, MRSA Information and Surgical Site Infection Prevention

## 2012-09-25 NOTE — Progress Notes (Signed)
09/25/12   LAST OFFICE VISIT TO DR. HANK SMITH WAS THIS PAST Friday...Marland KitchenDA

## 2012-09-26 ENCOUNTER — Encounter (HOSPITAL_COMMUNITY): Payer: Self-pay

## 2012-09-26 NOTE — Progress Notes (Signed)
Anesthesia Chart Review:  Patient is a 72 year old female scheduled for left TKA by Dr. Madelon Lips on 10/03/12.  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 PCP is Dr. Pete Glatter.  Cardiologist is Dr. Verdis [facility]).  He has cleared patient for this procedure.  EKG on 09/19/12 [facility]) showed SB @ 57 bpm.    Echo on 11/07/11 showed: - Left ventricle: The cavity size was normal. Systolic function was normal. The estimated ejection fraction was in the range of 60% to 65%. The study is not technically sufficient to allow evaluation of LV diastolic function. - Right atrium: The atrium was moderately to severely dilated. - Atrial septum: No defect or patent foramen ovale was identified. - Tricuspid valve: Moderate regurgitation. - Pulmonary arteries: Systolic pressure was moderately increased. PA peak pressure: 43mm Hg (S).  Cardiac cath on 09/21/03 showed coronaries, normal left ventricular function.  She has a history of an abnormal Cardiolite X 2.  The first in 2004 followed by a cardiac cath showing normal coronaries. Her last stress test was on 04/02/07 and showed a small region of apical ischemia identical to the study in 2004, so no further cardiac evaluation was recommended at that time.  CXR on 09/25/12 showed no active disease.  Preoperative labs noted.  Urine culture is still pending.  If no acute changes then anticipate she can proceed as planned.  Shonna Chock, PA-C 09/26/12 [phone]

## 2012-09-27 LAB — URINE CULTURE: Colony Count: 85000

## 2012-10-02 MED ORDER — CEFAZOLIN SODIUM-DEXTROSE 2-3 GM-% IV SOLR
2.0000 g | INTRAVENOUS | Status: AC
  Administered 2012-10-03: 2 g via INTRAVENOUS
  Filled 2012-10-02: qty 50

## 2012-10-02 NOTE — H&P (Signed)
TOTAL KNEE ADMISSION H&P  Patient is being admitted for left total knee arthroplasty.  Subjective:  Chief Complaint:left knee pain.  HPI: [name], 72 y.o. female, has a history of pain and functional disability in the left knee due to arthritis and has failed non-surgical conservative treatments for greater than 12 weeks to includeNSAID's and/or analgesics, corticosteriod injections, viscosupplementation injections and activity modification.  Onset of symptoms was gradual, starting 5 years ago with gradually worsening course since that time. The patient noted prior procedures on the knee to include  arthroscopy and menisectomy on the left knee(s).  Patient currently rates pain in the left knee(s) at 8 out of 10 with activity. Patient has night pain, worsening of pain with activity and weight bearing, pain that interferes with activities of daily living and joint swelling.  Patient has evidence of periarticular osteophytes and joint space narrowing by imaging studies.There is no active infection.  Patient Active Problem List   Diagnosis Date Noted  . Hypertension 11/01/2011  . Breast cancer 11/01/2011  . Necrotizing fasciitis 11/01/2011  . Hyperlipidemia 11/01/2011  . Leukocytosis 11/01/2011  . Sepsis 11/01/2011  . 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 Hypertension   . Hyperlipidemia   . Cancer     breast cancer  . Anal stenosis   . Dysrhythmia     atrial fibrilation  . Arthritis   . Atrial fibrillation   . History of cardioversion     02/04/2012  . Sleep apnea     cpap---DON'T KNOW SETTINGS  . Hypothyroidism     Past Surgical History  Procedure Laterality Date  . Anal dilation      multiple  . Abdominal hysterectomy    . Incision and drainage perirectal abscess  11/01/2011    Procedure: IRRIGATION AND DEBRIDEMENT PERIRECTAL ABSCESS;  Surgeon: Kandis Cocking, MD;  Location: [facility] OR;  Service: General;  Laterality: N/A;   Rigid Sigmiodoscopy ; Irrigation and Debridement left perineum and labia.  . Breast surgery      right mastectomy; left lumpectomy  . Joint replacement      right knee  . Colon surgery  1986  . Cardioversion  02/04/2012    Procedure: CARDIOVERSION;  Surgeon: Lesleigh Noe, MD;  Location: [facility] OR;  Service: Cardiovascular;  Laterality: N/A;     (Not in a hospital admission) Allergies  Allergen Reactions  . Benazepril Cough  . Diclofenac Itching    Feels difficult to swallow, but no tongue swelling or SOB. (per pt, this isn't an issue)    History  Substance Use Topics  . Smoking status: Never Smoker   . Smokeless tobacco: Never Used  . Alcohol Use: 8.4 oz/week    14 Glasses of wine per week     Comment: ocassional glass of wine    Family History  Problem Relation Age of Onset  . Cancer Maternal Aunt     breast     Review of Systems  Constitutional: Negative.   HENT: Positive for hearing loss. Negative for ear pain, nosebleeds, congestion, sore throat, tinnitus and ear discharge.   Eyes: Negative.   Respiratory: Positive for cough and shortness of breath. Negative for hemoptysis, sputum production, wheezing and stridor.   Cardiovascular: Positive for leg swelling. Negative for chest pain, palpitations and orthopnea.  Gastrointestinal: Positive for constipation. Negative for heartburn, nausea, vomiting, abdominal pain, diarrhea and blood in stool.  Genitourinary: Positive for frequency. Negative for dysuria, urgency, hematuria  and flank pain.  Musculoskeletal: Positive for joint pain. Negative for falls.  Skin: Negative.   Neurological: Negative.  Negative for headaches.  Endo/Heme/Allergies: Negative.   Psychiatric/Behavioral: Negative.     Objective:  Physical Exam  Constitutional: She is oriented to person, place, and time. She appears well-developed and well-nourished. No distress.  HENT:  Head: Normocephalic and atraumatic.  Nose: Nose normal.  Eyes: Conjunctivae  and EOM are normal. Pupils are equal, round, and reactive to light.  Neck: Normal range of motion. Neck supple.  Cardiovascular: Normal rate, regular rhythm and normal heart sounds.   Respiratory: Effort normal and breath sounds normal. No respiratory distress. She has no wheezes. She has no rales. She exhibits no tenderness.  GI: Soft. Bowel sounds are normal. She exhibits no distension. There is no tenderness.  Musculoskeletal:       Left knee: She exhibits decreased range of motion and swelling. She exhibits no deformity. Tenderness found. Medial joint line and lateral joint line tenderness noted.  Lymphadenopathy:    She has no cervical adenopathy.  Neurological: She is alert and oriented to person, place, and time. No cranial nerve deficit.  Skin: Skin is warm and dry. No rash noted. No erythema.  Psychiatric: She has a normal mood and affect. Her behavior is normal.    Vital signs in last 24 hours: @VSRANGES @  Labs:   Estimated body mass index is 31.16 kg/(m^2) as calculated from the following:   Height as of 12/19/11: 5\' 7"  (1.702 m).   Weight as of 12/19/11: 90.266 kg (199 lb).   Imaging Review Plain radiographs demonstrate severe degenerative joint disease of the left knee(s). The overall alignment isneutral. The bone quality appears to be good for age and reported activity level.  Assessment/Plan:  End stage arthritis, left knee   The patient history, physical examination, clinical judgment of the provider and imaging studies are consistent with end stage degenerative joint disease of the left knee(s) and total knee arthroplasty is deemed medically necessary. The treatment options including medical management, injection therapy arthroscopy and arthroplasty were discussed at length. The risks and benefits of total knee arthroplasty were presented and reviewed. The risks due to aseptic loosening, infection, stiffness, patella tracking problems, thromboembolic complications and  other imponderables were discussed. The patient acknowledged the explanation, agreed to proceed with the plan and consent was signed. Patient is being admitted for inpatient treatment for surgery, pain control, PT, OT, prophylactic antibiotics, VTE prophylaxis, progressive ambulation and ADL's and discharge planning. The patient is planning to be discharged home with home health services [place].

## 2012-10-03 ENCOUNTER — Encounter (HOSPITAL_COMMUNITY): Payer: Self-pay | Admitting: *Deleted

## 2012-10-03 ENCOUNTER — Inpatient Hospital Stay (HOSPITAL_COMMUNITY): Payer: Medicare Other | Admitting: Certified Registered Nurse Anesthetist

## 2012-10-03 ENCOUNTER — Encounter (HOSPITAL_COMMUNITY): Payer: Self-pay | Admitting: Vascular Surgery

## 2012-10-03 ENCOUNTER — Inpatient Hospital Stay (HOSPITAL_COMMUNITY)
Admission: RE | Admit: 2012-10-03 | Discharge: 2012-10-06 | DRG: 470 | Disposition: A | Discharge status: Home Health | Payer: Medicare Other | Source: Ambulatory Visit | Attending: Orthopedic Surgery | Admitting: Orthopedic Surgery

## 2012-10-03 ENCOUNTER — Encounter (HOSPITAL_COMMUNITY)
Admission: RE | Disposition: A | Discharge status: Home Health | Payer: Self-pay | Source: Ambulatory Visit | Attending: Orthopedic Surgery

## 2012-10-03 DIAGNOSIS — E039 Hypothyroidism, unspecified: Secondary | ICD-10-CM | POA: Diagnosis present

## 2012-10-03 DIAGNOSIS — I4891 Unspecified atrial fibrillation: Secondary | ICD-10-CM | POA: Diagnosis not present

## 2012-10-03 DIAGNOSIS — E785 Hyperlipidemia, unspecified: Secondary | ICD-10-CM | POA: Diagnosis present

## 2012-10-03 DIAGNOSIS — Z853 Personal history of malignant neoplasm of breast: Secondary | ICD-10-CM

## 2012-10-03 DIAGNOSIS — Z79899 Other long term (current) drug therapy: Secondary | ICD-10-CM

## 2012-10-03 DIAGNOSIS — M1712 Unilateral primary osteoarthritis, left knee: Secondary | ICD-10-CM | POA: Diagnosis present

## 2012-10-03 DIAGNOSIS — G8918 Other acute postprocedural pain: Secondary | ICD-10-CM | POA: Diagnosis not present

## 2012-10-03 DIAGNOSIS — M1710 Unilateral primary osteoarthritis, unspecified knee: Secondary | ICD-10-CM | POA: Diagnosis not present

## 2012-10-03 DIAGNOSIS — Z96659 Presence of unspecified artificial knee joint: Secondary | ICD-10-CM

## 2012-10-03 DIAGNOSIS — I10 Essential (primary) hypertension: Secondary | ICD-10-CM | POA: Diagnosis not present

## 2012-10-03 DIAGNOSIS — Z7982 Long term (current) use of aspirin: Secondary | ICD-10-CM

## 2012-10-03 DIAGNOSIS — G4730 Sleep apnea, unspecified: Secondary | ICD-10-CM | POA: Diagnosis present

## 2012-10-03 DIAGNOSIS — Z01812 Encounter for preprocedural laboratory examination: Secondary | ICD-10-CM

## 2012-10-03 DIAGNOSIS — Z7901 Long term (current) use of anticoagulants: Secondary | ICD-10-CM

## 2012-10-03 HISTORY — PX: TOTAL KNEE ARTHROPLASTY: SHX125

## 2012-10-03 LAB — CBC
HCT: 32 % — ABNORMAL LOW (ref 36.0–46.0)
MCHC: 32.8 g/dL (ref 30.0–36.0)
Platelets: 260 10*3/uL (ref 150–400)
RDW: 15.5 % (ref 11.5–15.5)
WBC: 10.3 10*3/uL (ref 4.0–10.5)

## 2012-10-03 LAB — CREATININE, SERUM
GFR calc Af Amer: >90 mL/min (ref >90)
GFR calc non Af Amer: 83 mL/min — ABNORMAL LOW (ref >90)

## 2012-10-03 SURGERY — ARTHROPLASTY, KNEE, TOTAL
Anesthesia: General | Site: Knee | Laterality: Left | Wound class: Clean

## 2012-10-03 MED ORDER — ACETAMINOPHEN 10 MG/ML IV SOLN
1000.0000 mg | Freq: Four times a day (QID) | INTRAVENOUS | Status: DC
  Administered 2012-10-03: 1000 mg via INTRAVENOUS

## 2012-10-03 MED ORDER — ACETAMINOPHEN 10 MG/ML IV SOLN
1000.0000 mg | Freq: Four times a day (QID) | INTRAVENOUS | Status: AC
  Administered 2012-10-03 – 2012-10-04 (×4): 1000 mg via INTRAVENOUS
  Filled 2012-10-03 (×4): qty 100

## 2012-10-03 MED ORDER — DOCUSATE SODIUM 100 MG PO CAPS
300.0000 mg | ORAL_CAPSULE | Freq: Every day | ORAL | Status: DC
  Administered 2012-10-03 – 2012-10-05 (×3): 300 mg via ORAL
  Filled 2012-10-03 (×4): qty 3

## 2012-10-03 MED ORDER — METHOCARBAMOL 100 MG/ML IJ SOLN: 500.0000 mg | Freq: Four times a day (QID) | INTRAVENOUS | Status: DC | PRN

## 2012-10-03 MED ORDER — ACETAMINOPHEN 10 MG/ML IV SOLN
INTRAVENOUS | Status: AC
  Filled 2012-10-03: qty 100

## 2012-10-03 MED ORDER — ACETAMINOPHEN 650 MG RE SUPP: 650.0000 mg | Freq: Four times a day (QID) | RECTAL | Status: DC | PRN

## 2012-10-03 MED ORDER — LEVOTHYROXINE SODIUM 25 MCG PO TABS
25.0000 ug | ORAL_TABLET | Freq: Every day | ORAL | Status: DC
  Administered 2012-10-04 – 2012-10-06 (×3): 25 ug via ORAL
  Filled 2012-10-03 (×4): qty 1

## 2012-10-03 MED ORDER — SODIUM CHLORIDE 0.9 % IR SOLN
Status: DC | PRN
  Administered 2012-10-03: 1000 mL

## 2012-10-03 MED ORDER — CHLORHEXIDINE GLUCONATE 4 % EX LIQD: 60.0000 mL | Freq: Once | CUTANEOUS | Status: DC

## 2012-10-03 MED ORDER — METHOCARBAMOL 500 MG PO TABS
500.0000 mg | ORAL_TABLET | Freq: Four times a day (QID) | ORAL | Status: DC | PRN
  Administered 2012-10-05: 500 mg via ORAL
  Filled 2012-10-03: qty 1

## 2012-10-03 MED ORDER — ADULT MULTIVITAMIN W/MINERALS CH
1.0000 | ORAL_TABLET | Freq: Every day | ORAL | Status: DC
  Administered 2012-10-03 – 2012-10-06 (×4): 1 via ORAL
  Filled 2012-10-03 (×4): qty 1

## 2012-10-03 MED ORDER — METOCLOPRAMIDE HCL 5 MG/ML IJ SOLN: 5.0000 mg | Freq: Three times a day (TID) | INTRAMUSCULAR | Status: DC | PRN

## 2012-10-03 MED ORDER — ENOXAPARIN SODIUM 30 MG/0.3ML ~~LOC~~ SOLN
30.0000 mg | Freq: Two times a day (BID) | SUBCUTANEOUS | Status: DC
  Administered 2012-10-04 – 2012-10-06 (×5): 30 mg via SUBCUTANEOUS
  Filled 2012-10-03 (×7): qty 0.3

## 2012-10-03 MED ORDER — BUPIVACAINE-EPINEPHRINE PF 0.5-1:200000 % IJ SOLN
INTRAMUSCULAR | Status: DC | PRN
  Administered 2012-10-03: 30 mL

## 2012-10-03 MED ORDER — CEFAZOLIN SODIUM 1-5 GM-% IV SOLN
1.0000 g | Freq: Four times a day (QID) | INTRAVENOUS | Status: AC
  Administered 2012-10-03 – 2012-10-05 (×8): 1 g via INTRAVENOUS
  Filled 2012-10-03 (×8): qty 50

## 2012-10-03 MED ORDER — OXYCODONE HCL 5 MG PO TABS: ORAL_TABLET | ORAL | Status: DC

## 2012-10-03 MED ORDER — SODIUM CHLORIDE 0.9 % IV SOLN: INTRAVENOUS | Status: DC

## 2012-10-03 MED ORDER — ONDANSETRON HCL 4 MG/2ML IJ SOLN: 4.0000 mg | Freq: Four times a day (QID) | INTRAMUSCULAR | Status: DC | PRN

## 2012-10-03 MED ORDER — ACETAMINOPHEN 325 MG PO TABS: 650.0000 mg | ORAL_TABLET | Freq: Four times a day (QID) | ORAL | Status: DC | PRN

## 2012-10-03 MED ORDER — MIDAZOLAM HCL 5 MG/5ML IJ SOLN
INTRAMUSCULAR | Status: DC | PRN
  Administered 2012-10-03: 2 mg via INTRAVENOUS

## 2012-10-03 MED ORDER — DARIFENACIN HYDROBROMIDE ER 15 MG PO TB24
15.0000 mg | ORAL_TABLET | Freq: Every day | ORAL | Status: DC
  Administered 2012-10-03 – 2012-10-06 (×4): 15 mg via ORAL
  Filled 2012-10-03 (×4): qty 1

## 2012-10-03 MED ORDER — PAROXETINE HCL 20 MG PO TABS
40.0000 mg | ORAL_TABLET | ORAL | Status: DC
  Administered 2012-10-04 – 2012-10-06 (×3): 40 mg via ORAL
  Filled 2012-10-03 (×4): qty 2

## 2012-10-03 MED ORDER — METOCLOPRAMIDE HCL 10 MG PO TABS: 5.0000 mg | ORAL_TABLET | Freq: Three times a day (TID) | ORAL | Status: DC | PRN

## 2012-10-03 MED ORDER — FUROSEMIDE 20 MG PO TABS
20.0000 mg | ORAL_TABLET | Freq: Every day | ORAL | Status: DC
  Administered 2012-10-03 – 2012-10-06 (×4): 20 mg via ORAL
  Filled 2012-10-03 (×4): qty 1

## 2012-10-03 MED ORDER — ACETAMINOPHEN 10 MG/ML IV SOLN
1000.0000 mg | Freq: Four times a day (QID) | INTRAVENOUS | Status: DC
  Filled 2012-10-03 (×3): qty 100

## 2012-10-03 MED ORDER — PHENOL 1.4 % MT LIQD: 1.0000 | OROMUCOSAL | Status: DC | PRN

## 2012-10-03 MED ORDER — PROPOFOL 10 MG/ML IV BOLUS
INTRAVENOUS | Status: DC | PRN
  Administered 2012-10-03: 150 mg via INTRAVENOUS

## 2012-10-03 MED ORDER — POTASSIUM CHLORIDE CRYS ER 20 MEQ PO TBCR
20.0000 meq | EXTENDED_RELEASE_TABLET | Freq: Every day | ORAL | Status: DC
  Administered 2012-10-03 – 2012-10-06 (×4): 20 meq via ORAL
  Filled 2012-10-03 (×4): qty 1

## 2012-10-03 MED ORDER — FENTANYL CITRATE 0.05 MG/ML IJ SOLN
INTRAMUSCULAR | Status: DC | PRN
  Administered 2012-10-03: 25 ug via INTRAVENOUS
  Administered 2012-10-03: 50 ug via INTRAVENOUS
  Administered 2012-10-03: 150 ug via INTRAVENOUS
  Administered 2012-10-03 (×3): 50 ug via INTRAVENOUS
  Administered 2012-10-03 (×2): 25 ug via INTRAVENOUS
  Administered 2012-10-03: 50 ug via INTRAVENOUS
  Administered 2012-10-03: 25 ug via INTRAVENOUS

## 2012-10-03 MED ORDER — SODIUM CHLORIDE 0.9 % IV SOLN
INTRAVENOUS | Status: DC
  Administered 2012-10-03: 12:00:00 via INTRAVENOUS

## 2012-10-03 MED ORDER — ONDANSETRON HCL 4 MG PO TABS: 4.0000 mg | ORAL_TABLET | Freq: Four times a day (QID) | ORAL | Status: DC | PRN

## 2012-10-03 MED ORDER — CALCIUM CARBONATE ANTACID 500 MG PO CHEW
1.0000 | CHEWABLE_TABLET | Freq: Every day | ORAL | Status: DC
  Administered 2012-10-03 – 2012-10-06 (×4): 200 mg via ORAL
  Filled 2012-10-03 (×4): qty 1

## 2012-10-03 MED ORDER — POLYETHYLENE GLYCOL 3350 17 G PO PACK: 17.0000 g | PACK | Freq: Every day | ORAL | Status: DC | PRN

## 2012-10-03 MED ORDER — LACTATED RINGERS IV SOLN
INTRAVENOUS | Status: DC | PRN
  Administered 2012-10-03 (×2): via INTRAVENOUS

## 2012-10-03 MED ORDER — HYDROMORPHONE HCL PF 1 MG/ML IJ SOLN: 0.5000 mg | INTRAMUSCULAR | Status: DC | PRN

## 2012-10-03 MED ORDER — MENTHOL 3 MG MT LOZG: 1.0000 | LOZENGE | OROMUCOSAL | Status: DC | PRN

## 2012-10-03 MED ORDER — DILTIAZEM HCL ER 180 MG PO CP24
180.0000 mg | ORAL_CAPSULE | Freq: Every day | ORAL | Status: DC
  Administered 2012-10-03 – 2012-10-06 (×4): 180 mg via ORAL
  Filled 2012-10-03 (×4): qty 1

## 2012-10-03 MED ORDER — ONDANSETRON HCL 4 MG/2ML IJ SOLN
INTRAMUSCULAR | Status: DC | PRN
  Administered 2012-10-03: 4 mg via INTRAVENOUS

## 2012-10-03 MED ORDER — LIDOCAINE HCL (CARDIAC) 20 MG/ML IV SOLN
INTRAVENOUS | Status: DC | PRN
  Administered 2012-10-03: 100 mg via INTRAVENOUS

## 2012-10-03 MED ORDER — ENOXAPARIN SODIUM 30 MG/0.3ML ~~LOC~~ SOLN: 30.0000 mg | Freq: Two times a day (BID) | SUBCUTANEOUS | Status: DC

## 2012-10-03 MED ORDER — OXYCODONE HCL 5 MG PO TABS
5.0000 mg | ORAL_TABLET | ORAL | Status: DC | PRN
  Administered 2012-10-04 – 2012-10-06 (×6): 10 mg via ORAL
  Filled 2012-10-03 (×6): qty 2

## 2012-10-03 MED ORDER — ATORVASTATIN CALCIUM 40 MG PO TABS
40.0000 mg | ORAL_TABLET | Freq: Every day | ORAL | Status: DC
  Administered 2012-10-03 – 2012-10-06 (×4): 40 mg via ORAL
  Filled 2012-10-03 (×4): qty 1

## 2012-10-03 SURGICAL SUPPLY — 58 items
BANDAGE ELASTIC 4 VELCRO ST LF (GAUZE/BANDAGES/DRESSINGS) ×2 IMPLANT
BANDAGE ELASTIC 6 VELCRO ST LF (GAUZE/BANDAGES/DRESSINGS) ×2 IMPLANT
BANDAGE ESMARK 6X9 LF (GAUZE/BANDAGES/DRESSINGS) ×1 IMPLANT
BLADE SAGITTAL 25.0X1.19X90 (BLADE) ×2 IMPLANT
BLADE SAW SAG 90X13X1.27 (BLADE) ×2 IMPLANT
BNDG ESMARK 6X9 LF (GAUZE/BANDAGES/DRESSINGS) ×2
BOWL SMART MIX CTS (DISPOSABLE) ×2 IMPLANT
CEMENT HV SMART SET (Cement) ×4 IMPLANT
CLOTH BEACON ORANGE TIMEOUT ST (SAFETY) ×2 IMPLANT
COVER SURGICAL LIGHT HANDLE (MISCELLANEOUS) ×2 IMPLANT
CUFF TOURNIQUET SINGLE 34IN LL (TOURNIQUET CUFF) ×2 IMPLANT
CUFF TOURNIQUET SINGLE 44IN (TOURNIQUET CUFF) IMPLANT
DRAPE INCISE IOBAN 66X45 STRL (DRAPES) IMPLANT
DRAPE ORTHO SPLIT 77X108 STRL (DRAPES) ×2
DRAPE SURG ORHT 6 SPLT 77X108 (DRAPES) ×2 IMPLANT
DRAPE U-SHAPE 47X51 STRL (DRAPES) ×2 IMPLANT
DRSG ADAPTIC 3X8 NADH LF (GAUZE/BANDAGES/DRESSINGS) ×2 IMPLANT
DRSG PAD ABDOMINAL 8X10 ST (GAUZE/BANDAGES/DRESSINGS) ×2 IMPLANT
DURAPREP 26ML APPLICATOR (WOUND CARE) ×2 IMPLANT
ELECT REM PT RETURN 9FT ADLT (ELECTROSURGICAL) ×2
ELECTRODE REM PT RTRN 9FT ADLT (ELECTROSURGICAL) ×1 IMPLANT
EVACUATOR 1/8 PVC DRAIN (DRAIN) ×2 IMPLANT
FACESHIELD LNG OPTICON STERILE (SAFETY) ×2 IMPLANT
FLOSEAL 10ML (HEMOSTASIS) IMPLANT
GLOVE BIOGEL PI IND STRL 8 (GLOVE) ×4 IMPLANT
GLOVE BIOGEL PI INDICATOR 8 (GLOVE) ×4
GLOVE ORTHO TXT STRL SZ7.5 (GLOVE) ×6 IMPLANT
GLOVE SURG ORTHO 8.0 STRL STRW (GLOVE) ×6 IMPLANT
GOWN PREVENTION PLUS XLARGE (GOWN DISPOSABLE) ×6 IMPLANT
GOWN PREVENTION PLUS XXLARGE (GOWN DISPOSABLE) ×2 IMPLANT
GOWN STRL NON-REIN LRG LVL3 (GOWN DISPOSABLE) ×4 IMPLANT
HANDPIECE INTERPULSE COAX TIP (DISPOSABLE) ×1
HOOD PEEL AWAY FACE SHEILD DIS (HOOD) ×2 IMPLANT
IMMOBILIZER KNEE 22 UNIV (SOFTGOODS) IMPLANT
KIT BASIN OR (CUSTOM PROCEDURE TRAY) ×2 IMPLANT
KIT ROOM TURNOVER OR (KITS) ×2 IMPLANT
MANIFOLD NEPTUNE II (INSTRUMENTS) ×2 IMPLANT
NEEDLE 22X1 1/2 (OR ONLY) (NEEDLE) IMPLANT
NS IRRIG 1000ML POUR BTL (IV SOLUTION) ×2 IMPLANT
PACK TOTAL JOINT (CUSTOM PROCEDURE TRAY) ×2 IMPLANT
PAD ARMBOARD 7.5X6 YLW CONV (MISCELLANEOUS) ×4 IMPLANT
PAD CAST 4YDX4 CTTN HI CHSV (CAST SUPPLIES) ×1 IMPLANT
PADDING CAST COTTON 4X4 STRL (CAST SUPPLIES) ×1
PADDING CAST COTTON 6X4 STRL (CAST SUPPLIES) ×2 IMPLANT
SET HNDPC FAN SPRY TIP SCT (DISPOSABLE) ×1 IMPLANT
SPONGE GAUZE 4X4 12PLY (GAUZE/BANDAGES/DRESSINGS) ×2 IMPLANT
STAPLER VISISTAT 35W (STAPLE) ×2 IMPLANT
SUCTION FRAZIER TIP 10 FR DISP (SUCTIONS) ×2 IMPLANT
SUT ETHIBOND NAB CT1 #1 30IN (SUTURE) ×6 IMPLANT
SUT VIC AB 0 CT1 27 (SUTURE) ×1
SUT VIC AB 0 CT1 27XBRD ANBCTR (SUTURE) ×1 IMPLANT
SUT VIC AB 2-0 CT1 27 (SUTURE) ×2
SUT VIC AB 2-0 CT1 TAPERPNT 27 (SUTURE) ×2 IMPLANT
SYR CONTROL 10ML LL (SYRINGE) IMPLANT
TOWEL OR 17X24 6PK STRL BLUE (TOWEL DISPOSABLE) ×2 IMPLANT
TOWEL OR 17X26 10 PK STRL BLUE (TOWEL DISPOSABLE) ×2 IMPLANT
TRAY FOLEY CATH 14FR (SET/KITS/TRAYS/PACK) ×2 IMPLANT
WATER STERILE IRR 1000ML POUR (IV SOLUTION) ×4 IMPLANT

## 2012-10-03 NOTE — Anesthesia Procedure Notes (Addendum)
Anesthesia Regional Block:  Femoral nerve block  Pre-Anesthetic Checklist: ,, timeout performed, Correct Patient, Correct Site, Correct Laterality, Correct Procedure, Correct Position, site marked, Risks and benefits discussed,  Surgical consent,  Pre-op evaluation,  At surgeon's request and post-op pain management  Laterality: Left  Prep: chloraprep       Needles:  Injection technique: Single-shot  Needle Type: Echogenic Stimulator Needle          Additional Needles:  Procedures: ultrasound guided (picture in chart) and nerve stimulator Femoral nerve block  Nerve Stimulator or Paresthesia:  Response: 0.4 mA,   Additional Responses:   Narrative:  Start time: 10/03/2012 7:00 AM End time: 10/03/2012 7:15 AM Injection made incrementally with aspirations every 5 mL.  Performed by: Personally  Anesthesiologist: Arta Bruce MD  Additional Notes: Monitors applied. Patient sedated. Sterile prep and drape,hand hygiene and sterile gloves were used. Relevant anatomy identified.Needle position confirmed.Local anesthetic injected incrementally after negative aspiration. Local anesthetic spread visualized around nerve(s). Vascular puncture avoided. No complications. Image printed for medical record.The patient tolerated the procedure well.       Femoral nerve block Procedure Name: LMA Insertion Date/Time: 10/03/2012 7:49 AM Performed by: Orvilla Fus A Pre-anesthesia Checklist: Patient identified, Timeout performed, Emergency Drugs available, Suction available and Patient being monitored Patient Re-evaluated:Patient Re-evaluated prior to inductionOxygen Delivery Method: Circle system utilized Preoxygenation: Pre-oxygenation with 100% oxygen Intubation Type: IV induction Ventilation: Mask ventilation without difficulty LMA: LMA inserted LMA Size: 4.0 Placement Confirmation: positive ETCO2 and breath sounds checked- equal and bilateral Tube secured with: Tape Dental Injury: Teeth and  Oropharynx as per pre-operative assessment

## 2012-10-03 NOTE — Progress Notes (Signed)
Orthopedic Tech Progress Note Patient Details:  [name] [facility] Nov 22, 1940 [id number] CPM applied to Left LE with appropriate settings. OHF applied to bed.  CPM Left Knee CPM Left Knee: On Left Knee Flexion (Degrees): 60 Left Knee Extension (Degrees): 0   Asia R Thompson 10/03/2012, 11:10 AM

## 2012-10-03 NOTE — Brief Op Note (Signed)
10/03/2012  10:00 AM  PATIENT:  [name]  72 y.o. female  PRE-OPERATIVE DIAGNOSIS:  OA LEFT KNEE  POST-OPERATIVE DIAGNOSIS:  OA left knee  PROCEDURE:  Procedure(s): LEFT TOTAL KNEE ARTHROPLASTY (Left)  SURGEON:  Surgeon(s) and Role:    * W D Carloyn Manner., MD - Primary  PHYSICIAN ASSISTANT:   ASSISTANTS: Margart Sickles, PA-C   ANESTHESIA:   regional and general  EBL:  Total I/O In: 1400 [I.V.:1400] Out: 95 [Urine:75; Blood:20]  BLOOD ADMINISTERED:none  DRAINS: hemovac left knee self suction  LOCAL MEDICATIONS USED:  NONE  SPECIMEN:  No Specimen  DISPOSITION OF SPECIMEN:  N/A  COUNTS:  YES  TOURNIQUET:   Total Tourniquet Time Documented: Thigh (Left) - 60 minutes Total: Thigh (Left) - 60 minutes   DICTATION: .Other Dictation: Dictation Number unknown  PLAN OF CARE: Admit to inpatient   PATIENT DISPOSITION:  PACU - hemodynamically stable.   Delay start of Pharmacological VTE agent (>24hrs) due to surgical blood loss or risk of bleeding: yes

## 2012-10-03 NOTE — Transfer of Care (Signed)
Immediate Anesthesia Transfer of Care Note  Patient: [name]  Procedure(s) Performed: Procedure(s): LEFT TOTAL KNEE ARTHROPLASTY (Left)  Patient Location: PACU  Anesthesia Type:General  Level of Consciousness: awake, alert  and patient cooperative  Airway & Oxygen Therapy: Patient Spontanous Breathing and Patient connected to nasal cannula oxygen  Post-op Assessment: Report given to PACU RN, Post -op Vital signs reviewed and stable and Patient moving all extremities  Post vital signs: Reviewed and stable  Complications: No apparent anesthesia complications

## 2012-10-03 NOTE — Evaluation (Signed)
Physical Therapy Evaluation Patient Details Name: [name] MRN: [id number] DOB: 27-Aug-1940 Today's Date: 10/03/2012 Time: 7829-5621 PT Time Calculation (min): 25 min  PT Assessment / Plan / Recommendation Clinical Impression  Patient is a 72 yo female s/p Lt. TKA.  Patient did well with mobility today. Will benefit from acute PT to maximize independence prior to return home with husband.  Recommend HHPT at discharge for continued therapy.    PT Assessment  Patient needs continued PT services    Follow Up Recommendations  Home health PT;Supervision/Assistance - 24 hour    Does the patient have the potential to tolerate intense rehabilitation      Barriers to Discharge None      Equipment Recommendations   (3-in-1 BSC)    Recommendations for Other Services     Frequency 7X/week    Precautions / Restrictions Precautions Precautions: Knee Precaution Booklet Issued: Yes (comment) Precaution Comments: Reviewed precautions with patient and husband. Required Braces or Orthoses: Knee Immobilizer - Left Knee Immobilizer - Left: On when out of bed or walking Restrictions Weight Bearing Restrictions: Yes LLE Weight Bearing: Weight bearing as tolerated   Pertinent Vitals/Pain       Mobility  Bed Mobility Bed Mobility: Supine to Sit;Sitting - Scoot to Edge of Bed Supine to Sit: 4: Min assist;HOB elevated Sitting - Scoot to Edge of Bed: 4: Min guard Details for Bed Mobility Assistance: Instructed patient on donning KI.  Verbal cues for technique.  Assist with moving LLE off of bed.  Patient sat at EOB x 6 minutes with good balance. Transfers Transfers: Sit to Stand;Stand to [employer] Transfers Sit to Stand: 4: Min assist;With upper extremity assist;From bed Stand to Sit: 4: Min assist;With upper extremity assist;With armrests;To chair/3-in-1 Stand Pivot Transfers: 4: Min assist Details for Transfer Assistance: Verbal cues for hand placement and placement of LLE  during transfers.  Cues for proper technique.  Assist to rise to standing.  Patient able to take several steps to pivot to chair. Ambulation/Gait Ambulation/Gait Assistance: Not tested (comment)    Exercises Total Joint Exercises Ankle Circles/Pumps: AROM;Both;10 reps;Seated   PT Diagnosis: Difficulty walking;Generalized weakness  PT Problem List: Decreased strength;Decreased range of motion;Decreased activity tolerance;Decreased mobility;Decreased knowledge of use of DME;Decreased knowledge of precautions PT Treatment Interventions: DME instruction;Gait training;Stair training;Functional mobility training;Therapeutic exercise;Patient/family education   PT Goals Acute Rehab PT Goals PT Goal Formulation: With patient/family Time For Goal Achievement: 10/10/12 Potential to Achieve Goals: Good Pt will go Supine/Side to Sit: Independently;with HOB 0 degrees PT Goal: Supine/Side to Sit - Progress: Goal set today Pt will go Sit to Supine/Side: Independently;with HOB 0 degrees PT Goal: Sit to Supine/Side - Progress: Goal set today Pt will go Sit to Stand: with supervision;with upper extremity assist PT Goal: Sit to Stand - Progress: Goal set today Pt will Ambulate: 51 - 150 feet;with supervision;with rolling walker PT Goal: Ambulate - Progress: Goal set today Pt will Go Up / Down Stairs: 1-2 stairs;with min assist;with least restrictive assistive device PT Goal: Up/Down Stairs - Progress: Goal set today Pt will Perform Home Exercise Program: with supervision, verbal cues required/provided PT Goal: Perform Home Exercise Program - Progress: Goal set today  Visit Information  Last PT Received On: 10/03/12 Assistance Needed: +1    Subjective Data  Subjective: "I'm not in any pain right now" Patient Stated Goal: To walk.  To go home.   Prior Functioning  Home Living Lives With: Spouse Available Help at Discharge: Family;Available 24 hours/day  Type of Home: House Home Access: Stairs to  enter [employer] of Steps: 2 Entrance Stairs-Rails: None Home Layout: Two level;Able to live on main level with bedroom/bathroom Bathroom Shower/Tub: Health visitor: Standard Bathroom Accessibility: Yes How Accessible: Accessible via walker Home Adaptive Equipment: Walker - rolling;Straight cane Prior Function Level of Independence: Independent Able to Take Stairs?: Yes Driving: Yes Vocation: Retired Musician: Surveyor, mining Overall Cognitive Status: Appears within functional limits for tasks assessed/performed Arousal/Alertness: Awake/alert Orientation Level: Appears intact for tasks assessed Behavior During Session:  [facility] for tasks performed    Extremity/Trunk Assessment Right Upper Extremity Assessment RUE ROM/Strength/Tone: WFL for tasks assessed RUE Sensation: WFL - Light Touch Left Upper Extremity Assessment LUE ROM/Strength/Tone: WFL for tasks assessed LUE Sensation: WFL - Light Touch Right Lower Extremity Assessment RLE ROM/Strength/Tone: WFL for tasks assessed RLE Sensation: WFL - Light Touch Left Lower Extremity Assessment LLE ROM/Strength/Tone: Deficits;Unable to fully assess;Due to pain LLE ROM/Strength/Tone Deficits: Able to assist with moving LLE off of bed.   Balance Balance Balance Assessed: Yes Static Sitting Balance Static Sitting - Balance Support: No upper extremity supported;Feet supported Static Sitting - Level of Assistance: 5: Stand by assistance Static Sitting - Comment/# of Minutes: 7  End of Session PT - End of Session Equipment Utilized During Treatment: Gait belt;Left knee immobilizer;Oxygen Activity Tolerance: Patient tolerated treatment well Patient left: in chair;with call bell/phone within reach;with family/visitor present Nurse Communication: Mobility status CPM Left Knee CPM Left Knee: Off  GP     Vena Austria 10/03/2012, 3:44 PM  Durenda Hurt. Renaldo Fiddler, [facility] Acute Rehab  Services Pager [phone]

## 2012-10-03 NOTE — Anesthesia Preprocedure Evaluation (Addendum)
Anesthesia Evaluation  Patient identified by MRN, date of birth, ID band Patient awake    Reviewed: Allergy & Precautions, H&P , NPO status , Patient's Chart, lab work & pertinent test results, reviewed documented beta blocker date and time   History of Anesthesia Complications Negative for: history of anesthetic complications  Airway Mallampati: II TM Distance: >3 FB Neck ROM: Full    Dental  (+) Teeth Intact and Dental Advisory Given   Pulmonary sleep apnea and Continuous Positive Airway Pressure Ventilation ,  breath sounds clear to auscultation  Pulmonary exam normal       Cardiovascular hypertension, Pt. on medications + dysrhythmias Atrial Fibrillation Rhythm:Regular Rate:Normal     Neuro/Psych negative neurological ROS  negative psych ROS   GI/Hepatic negative GI ROS, Neg liver ROS,   Endo/Other  Hypothyroidism   Renal/GU negative Renal ROS     Musculoskeletal negative musculoskeletal ROS (+)   Abdominal Normal abdominal exam  (+)  Abdomen: soft. Bowel sounds: normal.  Peds  Hematology negative hematology ROS (+)   Anesthesia Other Findings   Reproductive/Obstetrics negative OB ROS                        Anesthesia Physical Anesthesia Plan  ASA: II  Anesthesia Plan: General   Post-op Pain Management:    Induction: Intravenous  Airway Management Planned: LMA  Additional Equipment:   Intra-op Plan:   Post-operative Plan: Extubation in OR  Informed Consent: I have reviewed the patients History and Physical, chart, labs and discussed the procedure including the risks, benefits and alternatives for the proposed anesthesia with the patient or authorized representative who has indicated his/her understanding and acceptance.   Dental advisory given  Plan Discussed with: CRNA and Surgeon  Anesthesia Plan Comments:        Anesthesia Quick Evaluation

## 2012-10-03 NOTE — Preoperative (Signed)
Beta Blockers   Reason not to administer Beta Blockers:Not Applicable 

## 2012-10-03 NOTE — Interval H&P Note (Signed)
History and Physical Interval Note:  10/03/2012 7:38 AM  Montine Circle  has presented today for surgery, with the diagnosis of OA LEFT KNEE  The various methods of treatment have been discussed with the patient and family. After consideration of risks, benefits and other options for treatment, the patient has consented to  Procedure(s): LEFT TOTAL KNEE ARTHROPLASTY (Left) as a surgical intervention .  The patient's history has been reviewed, patient examined, no change in status, stable for surgery.  I have reviewed the patient's chart and labs.  Questions were answered to the patient's satisfaction.     CAFFREY JR,W D

## 2012-10-03 NOTE — H&P (View-Only) (Signed)
TOTAL KNEE ADMISSION H&P  Patient is being admitted for left total knee arthroplasty.  Subjective:  Chief Complaint:left knee pain.  HPI: [name], 71 y.o. female, has a history of pain and functional disability in the left knee due to arthritis and has failed non-surgical conservative treatments for greater than 12 weeks to includeNSAID's and/or analgesics, corticosteriod injections, viscosupplementation injections and activity modification.  Onset of symptoms was gradual, starting 5 years ago with gradually worsening course since that time. The patient noted prior procedures on the knee to include  arthroscopy and menisectomy on the left knee(s).  Patient currently rates pain in the left knee(s) at 8 out of 10 with activity. Patient has night pain, worsening of pain with activity and weight bearing, pain that interferes with activities of daily living and joint swelling.  Patient has evidence of periarticular osteophytes and joint space narrowing by imaging studies.There is no active infection.  Patient Active Problem List   Diagnosis Date Noted  . Hypertension 11/01/2011  . Breast cancer 11/01/2011  . Necrotizing fasciitis 11/01/2011  . Hyperlipidemia 11/01/2011  . Leukocytosis 11/01/2011  . Sepsis 11/01/2011  . 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 Hypertension   . Hyperlipidemia   . Cancer     breast cancer  . Anal stenosis   . Dysrhythmia     atrial fibrilation  . Arthritis   . Atrial fibrillation   . History of cardioversion     02/04/2012  . Sleep apnea     cpap---DON'T KNOW SETTINGS  . Hypothyroidism     Past Surgical History  Procedure Laterality Date  . Anal dilation      multiple  . Abdominal hysterectomy    . Incision and drainage perirectal abscess  11/01/2011    Procedure: IRRIGATION AND DEBRIDEMENT PERIRECTAL ABSCESS;  Surgeon: David H Newman, MD;  Location: [facility] OR;  Service: General;  Laterality: N/A;   Rigid Sigmiodoscopy ; Irrigation and Debridement left perineum and labia.  . Breast surgery      right mastectomy; left lumpectomy  . Joint replacement      right knee  . Colon surgery  1986  . Cardioversion  02/04/2012    Procedure: CARDIOVERSION;  Surgeon: Henry W Smith III, MD;  Location: [facility] OR;  Service: Cardiovascular;  Laterality: N/A;     (Not in a hospital admission) Allergies  Allergen Reactions  . Benazepril Cough  . Diclofenac Itching    Feels difficult to swallow, but no tongue swelling or SOB. (per pt, this isn't an issue)    History  Substance Use Topics  . Smoking status: Never Smoker   . Smokeless tobacco: Never Used  . Alcohol Use: 8.4 oz/week    14 Glasses of wine per week     Comment: ocassional glass of wine    Family History  Problem Relation Age of Onset  . Cancer Maternal Aunt     breast     Review of Systems  Constitutional: Negative.   HENT: Positive for hearing loss. Negative for ear pain, nosebleeds, congestion, sore throat, tinnitus and ear discharge.   Eyes: Negative.   Respiratory: Positive for cough and shortness of breath. Negative for hemoptysis, sputum production, wheezing and stridor.   Cardiovascular: Positive for leg swelling. Negative for chest pain, palpitations and orthopnea.  Gastrointestinal: Positive for constipation. Negative for heartburn, nausea, vomiting, abdominal pain, diarrhea and blood in stool.  Genitourinary: Positive for frequency. Negative for dysuria, urgency, hematuria   and flank pain.  Musculoskeletal: Positive for joint pain. Negative for falls.  Skin: Negative.   Neurological: Negative.  Negative for headaches.  Endo/Heme/Allergies: Negative.   Psychiatric/Behavioral: Negative.     Objective:  Physical Exam  Constitutional: She is oriented to person, place, and time. She appears well-developed and well-nourished. No distress.  HENT:  Head: Normocephalic and atraumatic.  Nose: Nose normal.  Eyes: Conjunctivae  and EOM are normal. Pupils are equal, round, and reactive to light.  Neck: Normal range of motion. Neck supple.  Cardiovascular: Normal rate, regular rhythm and normal heart sounds.   Respiratory: Effort normal and breath sounds normal. No respiratory distress. She has no wheezes. She has no rales. She exhibits no tenderness.  GI: Soft. Bowel sounds are normal. She exhibits no distension. There is no tenderness.  Musculoskeletal:       Left knee: She exhibits decreased range of motion and swelling. She exhibits no deformity. Tenderness found. Medial joint line and lateral joint line tenderness noted.  Lymphadenopathy:    She has no cervical adenopathy.  Neurological: She is alert and oriented to person, place, and time. No cranial nerve deficit.  Skin: Skin is warm and dry. No rash noted. No erythema.  Psychiatric: She has a normal mood and affect. Her behavior is normal.    Vital signs in last 24 hours: @VSRANGES@  Labs:   Estimated body mass index is 31.16 kg/(m^2) as calculated from the following:   Height as of 12/19/11: 5' 7" (1.702 m).   Weight as of 12/19/11: 90.266 kg (199 lb).   Imaging Review Plain radiographs demonstrate severe degenerative joint disease of the left knee(s). The overall alignment isneutral. The bone quality appears to be good for age and reported activity level.  Assessment/Plan:  End stage arthritis, left knee   The patient history, physical examination, clinical judgment of the provider and imaging studies are consistent with end stage degenerative joint disease of the left knee(s) and total knee arthroplasty is deemed medically necessary. The treatment options including medical management, injection therapy arthroscopy and arthroplasty were discussed at length. The risks and benefits of total knee arthroplasty were presented and reviewed. The risks due to aseptic loosening, infection, stiffness, patella tracking problems, thromboembolic complications and  other imponderables were discussed. The patient acknowledged the explanation, agreed to proceed with the plan and consent was signed. Patient is being admitted for inpatient treatment for surgery, pain control, PT, OT, prophylactic antibiotics, VTE prophylaxis, progressive ambulation and ADL's and discharge planning. The patient is planning to be discharged home with home health services [facility].    

## 2012-10-03 NOTE — Progress Notes (Signed)
UR COMPLETED  

## 2012-10-04 LAB — CBC
MCV: 86.5 fL (ref 78.0–100.0)
Platelets: 252 10*3/uL (ref 150–400)
RBC: 3.42 MIL/uL — ABNORMAL LOW (ref 3.87–5.11)
RDW: 15.6 % — ABNORMAL HIGH (ref 11.5–15.5)
WBC: 10.7 10*3/uL — ABNORMAL HIGH (ref 4.0–10.5)

## 2012-10-04 LAB — BASIC METABOLIC PANEL
CO2: 27 mEq/L (ref 19–32)
Chloride: 103 mEq/L (ref 96–112)
Creatinine, Ser: 0.72 mg/dL (ref 0.50–1.10)
GFR calc Af Amer: >90 mL/min (ref >90)
Potassium: 3.7 mEq/L (ref 3.5–5.1)
Sodium: 137 mEq/L (ref 135–145)

## 2012-10-04 NOTE — Progress Notes (Signed)
Physical Therapy Treatment Patient Details Name: [name] MRN: [id number] DOB: 1941/07/17 Today's Date: 10/04/2012 Time: 1027-2536 PT Time Calculation (min): 34 min  PT Assessment / Plan / Recommendation Comments on Treatment Session  Continues to improve with ambulation - greater distance this pm.    Follow Up Recommendations  Home health PT;Supervision/Assistance - 24 hour     Does the patient have the potential to tolerate intense rehabilitation     Barriers to Discharge        Equipment Recommendations   (3-in-1 BSC)    Recommendations for Other Services    Frequency 7X/week   Plan Discharge plan remains appropriate;Frequency remains appropriate    Precautions / Restrictions Precautions Precautions: Knee Required Braces or Orthoses: Knee Immobilizer - Left Knee Immobilizer - Left: On when out of bed or walking Restrictions Weight Bearing Restrictions: Yes LLE Weight Bearing: Weight bearing as tolerated   Pertinent Vitals/Pain     Mobility  Bed Mobility Bed Mobility: Supine to Sit;Sitting - Scoot to [place] of Bed;Sit to Supine Supine to Sit: 4: Min assist;With rails;HOB elevated Sitting - Scoot to Edge of Bed: 4: Min guard Sit to Supine: 4: Min assist;HOB flat Details for Bed Mobility Assistance: Reviewed technique to sit at EOB.  Assist at end of transition to maintain balance and scoot to EOB.  Assist with LLE when returning to supine. Transfers Transfers: Sit to Stand;Stand to Sit Sit to Stand: 4: Min assist;From elevated surface;With upper extremity assist;From bed Stand to Sit: 4: Min assist;With upper extremity assist;To bed Details for Transfer Assistance: Repeated verbal cues to use correct technique.  Assist to rise to standing. Ambulation/Gait Ambulation/Gait Assistance: 4: Min assist Ambulation Distance (Feet): 104 Feet Assistive device: Rolling walker Ambulation/Gait Assistance Details: Verbal cues for correct gait sequence.  Improved distance  with gait this pm. Gait Pattern: Step-to pattern;Decreased stance time - left;Decreased step length - right;Antalgic;Trunk flexed Gait velocity: Slow gait speed    Exercises Total Joint Exercises Ankle Circles/Pumps: AROM;Both;10 reps;Supine Quad Sets: AROM;Left;10 reps;Supine Short Arc Quad: AAROM;Left;10 reps;Supine Heel Slides: AAROM;Left;10 reps;Supine     PT Goals Acute Rehab PT Goals PT Goal: Supine/Side to Sit - Progress: Progressing toward goal PT Goal: Sit to Supine/Side - Progress: Progressing toward goal PT Goal: Sit to Stand - Progress: Progressing toward goal PT Goal: Ambulate - Progress: Progressing toward goal PT Goal: Perform Home Exercise Program - Progress: Progressing toward goal  Visit Information  Last PT Received On: 10/04/12 Assistance Needed: +1    Subjective Data  Subjective: "I've been to the bathroom"   Cognition  Cognition Overall Cognitive Status: Appears within functional limits for tasks assessed/performed Arousal/Alertness: Awake/alert Orientation Level: Appears intact for tasks assessed Behavior During Session: [facility] for tasks performed Cognition - Other Comments: Some difficulty following instructions this pm.    Balance     End of Session PT - End of Session Equipment Utilized During Treatment: Gait belt;Left knee immobilizer Activity Tolerance: Patient tolerated treatment well Patient left: in bed;in CPM;with call bell/phone within reach;with family/visitor present Nurse Communication: Mobility status (In CPM)   GP     Vena Austria 10/04/2012, 2:38 PM

## 2012-10-04 NOTE — Op Note (Signed)
NAMEIRLENE, CRUDUP NO.:  [id number]  MEDICAL RECORD NO.:  [id number]  LOCATION:  5N01C                        FACILITY:  [facility]  PHYSICIAN:  Dyke Brackett, M.D.    DATE OF BIRTH:  May 25, 1941  DATE OF PROCEDURE:  10/03/2012 DATE OF DISCHARGE:                              OPERATIVE REPORT   INDICATIONS:  A 72 year old, severe valgus deformity.  Left knee arthritis thought amenable to hospitalization replacement.  PREOPERATIVE DIAGNOSIS:  Osteoarthritis, left knee with valgus deformity.  POSTOPERATIVE DIAGNOSIS:  Osteoarthritis, left knee with valgus deformity.  OPERATION:  Left total knee replacement (sigma cemented knee size 3 femur, tibia, 12.5-bearing, 35-mm all poly patella).  SURGEON:  Dyke Brackett, M.D.  ASSISTANT:  Margart Sickles, PA-C  ANESTHESIA:  General with nerve block.  TOURNIQUET TIME:  59 minutes.  DESCRIPTION OF PROCEDURE:  Sterile prep and drape, exsanguination of leg, inflation of tourniquet to 350, straight skin incision in medial parapatellar approach can be made.  We cut about 2 to 3 mm below the most diseased lateral compartment.  Prior to this, we cut 10 eventually 11 mm off the distal femur, a 5 degree valgus inclination.  Extension gap was measured at 10 and then 12.5 mm.  Car[place] was cut for the tibial prosthesis as well.  Prior to this that we did place the referencing guide with size 3 with a 12.5 mm spacer block on the sizer for the femur.  We then pinned 2 pins in the femur and then replaced this with the anterior-posterior chamfer cuts, with appropriate degree of external rotation.  We made all cuts mentioned above, then matched the flexion gap at 12.5 mm to the extension gap.  Keel hole that was cut for the tibia, followed by placement of the tibial trial.  Box cut was made on the femur.  We then trialed the femur and tibia with a 10 and 12-1/2 mm. We obtained excellent parameters, relative stability, full  extension, good balance, and ligaments with 12.5 mm bearing.  Patella was cut leaving about 15 to 16 native patella plus 3 PEG all poly patella and then trials were again placed on this and all parameters deemed to be acceptable.  Final components were inserted with femur, tibia followed by femur and patella.  We did elect to place a trial bearing.  Cement was allowed to harden.  Trial bearing was removed.  No excess cement was noted back of the knee.  We then released the tourniquet.  No excess bleeding was noted.  Small bleeders were coagulated.  Final bearing was placed with a Hemovac superolaterally.  Closure was affected with #1 Ethibond, 0 and 2-0 Vicryl, and skin clips.  She was taken to recovery room stable condition.  Compressive sterile dressing and knee immobilizer was applied.     Dyke Brackett, M.D.     WDC/MEDQ  D:  10/03/2012  T:  10/04/2012  Job:  [id number]

## 2012-10-04 NOTE — Progress Notes (Signed)
Physical Therapy Treatment Patient Details Name: [name] MRN: [id number] DOB: February 12, 1941 Today's Date: 10/04/2012 Time: 6295-2841 PT Time Calculation (min): 27 min  PT Assessment / Plan / Recommendation Comments on Treatment Session  Patient making slow improvement with mobility.  Needed reminders for mobility technique.    Follow Up Recommendations  Home health PT;Supervision/Assistance - 24 hour     Does the patient have the potential to tolerate intense rehabilitation     Barriers to Discharge        Equipment Recommendations   (3-in-1 BSC)    Recommendations for Other Services    Frequency 7X/week   Plan Discharge plan remains appropriate;Frequency remains appropriate    Precautions / Restrictions Precautions Precautions: Knee Required Braces or Orthoses: Knee Immobilizer - Left Knee Immobilizer - Left: On when out of bed or walking Restrictions Weight Bearing Restrictions: Yes LLE Weight Bearing: Weight bearing as tolerated   Pertinent Vitals/Pain     Mobility  Bed Mobility Bed Mobility: Not assessed Supine to Sit: 4: Min assist;With rails;HOB elevated Sitting - Scoot to Edge of Bed: 4: Min guard Details for Bed Mobility Assistance: Reviewed donning KI with patient and husband.  Verbal cues for technique to move to sitting.  Assist with LLE. Transfers Transfers: Sit to Stand;Stand to Sit Sit to Stand: 4: Min assist;With upper extremity assist;From chair/3-in-1 Stand to Sit: 4: Min assist;With upper extremity assist;To chair/3-in-1 Details for Transfer Assistance: min v/c for hand placement Ambulation/Gait Ambulation/Gait Assistance: 4: Min assist Ambulation Distance (Feet): 6 Feet Assistive device: Rolling walker Ambulation/Gait Assistance Details: Verbal cues for safe use of RW and gait sequence.  Assist to maneuver RW in turns. Gait Pattern: Step-to pattern;Decreased stance time - left;Decreased step length - right;Antalgic;Trunk flexed Gait  velocity: Slow gait speed    Exercises Total Joint Exercises Ankle Circles/Pumps: AROM;15 reps;Seated (pt c/o dizziness) Quad Sets: AROM;Left;10 reps;Supine Short Arc Quad: AAROM;Left;10 reps;Supine Heel Slides: AAROM;Left;10 reps;Supine Hip ABduction/ADduction: AAROM;Left;10 reps;Supine     PT Goals Acute Rehab PT Goals PT Goal: Supine/Side to Sit - Progress: Progressing toward goal PT Goal: Sit to Stand - Progress: Progressing toward goal PT Goal: Ambulate - Progress: Progressing toward goal  Visit Information  Last PT Received On: 10/04/12 Assistance Needed: +1    Subjective Data  Subjective: "This is my first time walking"   Cognition  Cognition Overall Cognitive Status: Appears within functional limits for tasks assessed/performed Arousal/Alertness: Awake/alert Orientation Level: Appears intact for tasks assessed Behavior During Session: [facility] for tasks performed Cognition - Other Comments: delayed response    Balance     End of Session PT - End of Session Equipment Utilized During Treatment: Gait belt;Left knee immobilizer Activity Tolerance: Patient tolerated treatment well Patient left: in chair;with call bell/phone within reach;with family/visitor present Nurse Communication: Mobility status   GP     Vena Austria 10/04/2012, 12:55 PM Durenda Hurt. Renaldo Fiddler, [facility] Acute Rehab Services Pager [phone]

## 2012-10-04 NOTE — Progress Notes (Signed)
Seen in room 5N01  Postop day # 1  Afebrile, vital signs stable, total drainage 450 cc yesterday  S: very little pain, has been up with PT  O: comfortably seated  A: satisfactory  P: cont PT

## 2012-10-04 NOTE — Clinical Social Work Note (Signed)
CSW consult for SNF. PT recommendation for HHPT noted. CSW signing off as no other CSW needs identified at this time.  Josie Barnes, MSW, LCSWA [phone] (Weekends 8:00am-4:30pm)    

## 2012-10-04 NOTE — Progress Notes (Addendum)
CARE MANAGEMENT NOTE 10/04/2012  Patient:  [name], [name]   Account Number:  [id number]  Date Initiated:  10/04/2012  Documentation initiated by:  Vance Peper  Subjective/Objective Assessment:   72 yr old female s/p left total knee arthroplasty.     Action/Plan:   Patient preoperatively setup with [facility]. no changes. CPM has been delivered to the home. Patient has rolling walker and 3in1.   Anticipated DC Date:  10/06/2012   Anticipated DC Plan:  HOME W HOME HEALTH SERVICES      DC Planning Services  CM consult      [facility] Choice  HOME HEALTH   Choice offered to / List presented to:  C-1 Patient        HH arranged  HH-2 PT      [facility] agency  [facility]   Status of service: completed Medicare Important Message given?   (If response is "NO", the following Medicare IM given date fields will be blank) Date Medicare IM given:   Date Additional Medicare IM given:    Discharge Disposition:  HOME W HOME HEALTH SERVICES  Per UR Regulation:    If discussed at Long Length of Stay Meetings, dates discussed:    Comments:

## 2012-10-04 NOTE — Evaluation (Signed)
Occupational Therapy Evaluation Patient Details Name: [name] MRN: [id number] DOB: 1940-08-01 Today's Date: 10/04/2012 Time: 3875-6433 OT Time Calculation (min): 27 min  OT Assessment / Plan / Recommendation Clinical Impression  72 yo female HOH s/p Lt TKA that could benefit from skilled OT acutely. Recommend HHOT for d/c planning    OT Assessment  Patient needs continued OT Services    Follow Up Recommendations  Home health OT    Barriers to Discharge      Equipment Recommendations  None recommended by OT    Recommendations for Other Services    Frequency  Min 2X/week    Precautions / Restrictions Precautions Precautions: Knee Required Braces or Orthoses: Knee Immobilizer - Left Knee Immobilizer - Left: On when out of bed or walking Restrictions LLE Weight Bearing: Weight bearing as tolerated   Pertinent Vitals/Pain 7 ot 10    ADL  Eating/Feeding: Set up Where Assessed - Eating/Feeding: Chair Lower Body Dressing: Minimal assistance Where Assessed - Lower Body Dressing: Supported sit to stand (using AE) Toilet Transfer: Minimal assistance Toilet Transfer Method: Sit to stand Toilet Transfer Equipment: Raised toilet seat with arms (or 3-in-1 over toilet) Equipment Used: Gait belt;Knee Immobilizer;Rolling walker;Reacher Transfers/Ambulation Related to ADLs: Pt educated on gait sequence with RW for toilet transfers and shower transfers. Pt provided hand out for shower transfer with visual demonstration. Pt currently not safe to attempt an actual shower transfer.  ADL Comments: pt simulated shower transfer on flat surface without actual stepping over . Pt with poor sequence. Pt required repetition of instructions and husband also stating instructions. Pt with delayed response    OT Diagnosis: Generalized weakness;Acute pain  OT Problem List: Decreased strength;Decreased activity tolerance;Impaired balance (sitting and/or standing);Decreased safety  awareness;Decreased knowledge of use of DME or AE;Decreased knowledge of precautions;Pain OT Treatment Interventions: Self-care/ADL training;DME and/or AE instruction;Therapeutic activities;Patient/family education;Balance training   OT Goals Acute Rehab OT Goals OT Goal Formulation: With patient/family Time For Goal Achievement: 10/18/12 Potential to Achieve Goals: Good ADL Goals Pt Will Perform Lower Body Dressing: with modified independence;Sit to stand from chair;with adaptive equipment ADL Goal: Lower Body Dressing - Progress: Goal set today Pt Will Perform Tub/Shower Transfer: with set-up;Ambulation ADL Goal: Tub/Shower Transfer - Progress: Goal set today Additional ADL Goal #1: Pt will complete bed mobilty MOD i as precursor to adls  Visit Information  Last OT Received On: 10/04/12 Assistance Needed: +1    Subjective Data  Subjective: "I put the Lt one first" Patient Stated Goal: to return home with husband   Prior Functioning     Home Living Lives With: Spouse Available Help at Discharge: Family;Available 24 hours/day Type of Home: House Home Access: Stairs to enter [employer] of Steps: 2 Entrance Stairs-Rails: None Home Layout: Two level;Able to live on main level with bedroom/bathroom Bathroom Shower/Tub: Health visitor: Standard Bathroom Accessibility: Yes How Accessible: Accessible via walker Home Adaptive Equipment: Walker - rolling;Straight cane Prior Function Level of Independence: Independent Able to Take Stairs?: Yes Driving: Yes Vocation: Retired Musician: H[employer] Dominant Hand: Right         Vision/Perception Vision - History Baseline Vision: Wears contacts Patient Visual Report: No change from baseline   Cognition  Cognition Overall Cognitive Status: Appears within functional limits for tasks assessed/performed Arousal/Alertness: Awake/alert Orientation Level: Appears intact for tasks  assessed Behavior During Session: [facility] for tasks performed Cognition - Other Comments: delayed response    Extremity/Trunk Assessment Right Upper Extremity Assessment RUE ROM/Strength/Tone: [facility] for  tasks assessed RUE Sensation: WFL - Light Touch RUE Coordination: WFL - gross/fine motor Left Upper Extremity Assessment LUE ROM/Strength/Tone: WFL for tasks assessed LUE Sensation: WFL - Light Touch LUE Coordination: WFL - gross/fine motor Trunk Assessment Trunk Assessment: Normal     Mobility Bed Mobility Bed Mobility: Not assessed Transfers Sit to Stand: 4: Min assist;With upper extremity assist;From chair/3-in-1 Stand to Sit: 4: Min assist;With upper extremity assist;To chair/3-in-1 Details for Transfer Assistance: min v/c for hand placement     Exercise Total Joint Exercises Ankle Circles/Pumps: AROM;15 reps;Seated (pt c/o dizziness)   Balance     End of Session OT - End of Session Activity Tolerance: Patient tolerated treatment well Patient left: in chair;with call bell/phone within reach Nurse Communication: Mobility status;Precautions  GO     Lucile Shutters 10/04/2012, 11:47 AM Pager: [phone]

## 2012-10-05 LAB — CBC
HCT: 25.2 % — ABNORMAL LOW (ref 36.0–46.0)
MCV: 85.7 fL (ref 78.0–100.0)
RBC: 2.94 MIL/uL — ABNORMAL LOW (ref 3.87–5.11)
RDW: 15.6 % — ABNORMAL HIGH (ref 11.5–15.5)
WBC: 11.7 10*3/uL — ABNORMAL HIGH (ref 4.0–10.5)

## 2012-10-05 NOTE — Progress Notes (Signed)
Physical Therapy Treatment Patient Details Name: [name] MRN: [id number] DOB: 1941/03/12 Today's Date: 10/05/2012 Time: 1191-4782 PT Time Calculation (min): 25 min  PT Assessment / Plan / Recommendation Comments on Treatment Session  Limited gait this am due to increased knee pain. Needs cues for sequencing with gait. Less assitance needed with transfers today.    Follow Up Recommendations  Home health PT;Supervision/Assistance - 24 hour           Equipment Recommendations  None recommended by PT       Frequency 7X/week   Plan Discharge plan remains appropriate;Frequency remains appropriate    Precautions / Restrictions Precautions Precautions: Knee Required Braces or Orthoses: Knee Immobilizer - Left Knee Immobilizer - Left: On when out of bed or walking Restrictions LLE Weight Bearing: Weight bearing as tolerated       Mobility  Bed Mobility Bed Mobility: Not assessed Transfers Sit to Stand: 4: Min guard;From chair/3-in-1;With upper extremity assist;With armrests Stand to Sit: 4: Min guard;To chair/3-in-1;With upper extremity assist;With armrests Details for Transfer Assistance: sit-stand transfer perfomed x3 reps with cues for hand placement with 2/3 reps. cues for LLE placement with all 3 reps. Ambulation/Gait Ambulation/Gait Assistance: 4: Min guard Ambulation Distance (Feet): 16 Feet (16 feet x2) Assistive device: Rolling walker Ambulation/Gait Assistance Details: mod cues for posture, seqence, to incr right step lenght/height (pt shuffles foot fwd most steps) and to incr weight bearing on right LE. Gait Pattern: Step-to pattern;Decreased stride length;Decreased stance time - right;Decreased step length - left;Decreased step length - right;Shuffle;Trunk flexed;Antalgic Gait velocity: decreased    Exercises Total Joint Exercises Ankle Circles/Pumps: AROM;Both;10 reps;Seated Quad Sets: AAROM;Both;10 reps;Seated Straight Leg Raises:  AAROM;Right;Strengthening;5 reps;Seated    PT Goals Acute Rehab PT Goals PT Goal: Sit to Stand - Progress: Progressing toward goal PT Goal: Ambulate - Progress: Progressing toward goal PT Goal: Perform Home Exercise Program - Progress: Progressing toward goal  Visit Information  Last PT Received On: 10/05/12 Assistance Needed: +1    Subjective Data  Subjective: No new complaints, agreeable to therapy at this time.   Cognition  Cognition Overall Cognitive Status: Appears within functional limits for tasks assessed/performed Orientation Level: Appears intact for tasks assessed Behavior During Session: [facility] for tasks performed       End of Session PT - End of Session Equipment Utilized During Treatment: Gait belt;Left knee immobilizer Activity Tolerance: Patient limited by pain Patient left: in chair;with call bell/phone within reach;with nursing in room;with family/visitor present Nurse Communication: Mobility status;Patient requests pain meds   GP     Sallyanne Kuster 10/05/2012, 2:09 PM  Sallyanne Kuster, PTA Office- [phone]

## 2012-10-05 NOTE — Progress Notes (Signed)
Seen in room 5N01, POD#2  Vital signs stable afebrile  S:Not at all prepared to go home today, needs help to bathroom  O: Seated in chair with legs up, serous drainage in hemovac  A: satisfactory  P: no discharge today. Disc drain

## 2012-10-06 ENCOUNTER — Encounter (HOSPITAL_COMMUNITY): Payer: Self-pay | Admitting: Orthopedic Surgery

## 2012-10-06 LAB — CBC
HCT: 25.2 % — ABNORMAL LOW (ref 36.0–46.0)
Hemoglobin: 8.4 g/dL — ABNORMAL LOW (ref 12.0–15.0)
MCH: 28.5 pg (ref 26.0–34.0)
RBC: 2.95 MIL/uL — ABNORMAL LOW (ref 3.87–5.11)

## 2012-10-06 NOTE — Anesthesia Postprocedure Evaluation (Signed)
  Anesthesia Post-op Note  Patient: [name]  Procedure(s) Performed: Procedure(s): LEFT TOTAL KNEE ARTHROPLASTY (Left)  Pt discharged when post-op visit attempted.

## 2012-10-06 NOTE — Progress Notes (Signed)
Physical Therapy Treatment Patient Details Name: PAULLA MCCLASKEY MRN: [id number] DOB: 1940/11/15 Today's Date: 10/06/2012 Time: 4098-1191 PT Time Calculation (min): 13 min  PT Assessment / Plan / Recommendation Comments on Treatment Session  Patient seen again once more prior to discharge home to review steps and go over stair handout.     Follow Up Recommendations  Home health PT;Supervision/Assistance - 24 hour     Does the patient have the potential to tolerate intense rehabilitation     Barriers to Discharge        Equipment Recommendations  None recommended by PT    Recommendations for Other Services    Frequency 7X/week   Plan Discharge plan remains appropriate;Frequency remains appropriate    Precautions / Restrictions Precautions Precautions: Knee Required Braces or Orthoses: Knee Immobilizer - Left (ok to attempt without KI today per PA) Knee Immobilizer - Left: On when out of bed or walking Restrictions Weight Bearing Restrictions: Yes LLE Weight Bearing: Weight bearing as tolerated   Pertinent Vitals/Pain     Mobility  Bed Mobility Bed Mobility: Not assessed Supine to Sit: 4: Min assist;HOB flat Sitting - Scoot to Edge of Bed: 4: Min assist Details for Bed Mobility Assistance: A for LLE Transfers Sit to Stand: 4: Min guard;From chair/3-in-1;With upper extremity assist Stand to Sit: 4: Min guard;With armrests;With upper extremity assist;To chair/3-in-1 Details for Transfer Assistance: Cues for safe hand placement Ambulation/Gait Ambulation/Gait Assistance: 4: Min guard Ambulation Distance (Feet): 20 Feet Assistive device: Rolling walker Ambulation/Gait Assistance Details: CUes for posture and sequence Gait Pattern: Step-to pattern;Decreased stride length;Decreased stance time - right;Decreased step length - left;Decreased step length - right;Trunk flexed Stairs: Yes Stairs Assistance: 4: Min assist Stairs Assistance Details (indicate cue type and  reason): A for RW. Husband assisting patient this session Stair Management Technique: No rails;With walker;Step to pattern;Backwards Number of Stairs: 2    Exercises Total Joint Exercises Quad Sets: Both;10 reps;Seated;AROM Short Arc QuadBarbaraann Boys;Left;10 reps;Supine Heel Slides: AAROM;Left;10 reps;Supine Hip ABduction/ADduction: AAROM;Left;10 reps;Supine Straight Leg Raises: AAROM;Right;Strengthening;5 reps;Seated   PT Diagnosis:    PT Problem List:   PT Treatment Interventions:     PT Goals Acute Rehab PT Goals PT Goal: Sit to Stand - Progress: Progressing toward goal PT Goal: Ambulate - Progress: Progressing toward goal PT Goal: Up/Down Stairs - Progress: Progressing toward goal  Visit Information  Last PT Received On: 10/06/12 Assistance Needed: +1    Subjective Data      Cognition  Cognition Overall Cognitive Status: Appears within functional limits for tasks assessed/performed Arousal/Alertness: Awake/alert Orientation Level: Appears intact for tasks assessed Behavior During Session: [facility] for tasks performed    Balance     End of Session PT - End of Session Equipment Utilized During Treatment: Gait belt Activity Tolerance: Patient tolerated treatment well Patient left: in chair;with family/visitor present Nurse Communication: Mobility status CPM Left Knee CPM Left Knee: Off   GP     Fredrich Birks 10/06/2012, 11:17 AM 10/06/2012 Fredrich Birks PTA [phone] pager [phone] office

## 2012-10-06 NOTE — Progress Notes (Signed)
Physical Therapy Treatment Patient Details Name: [name] MRN: [id number] DOB: [birth year] Today's Date: 10/06/2012 Time: 0856-     PT Assessment / Plan / Recommendation Comments on Treatment Session  Patient progressing well and able to tolerate increase gait and stair training. Will see once more for assurance on steps prior to discharge home    Follow Up Recommendations  Home health PT;Supervision/Assistance - 24 hour     Does the patient have the potential to tolerate intense rehabilitation     Barriers to Discharge        Equipment Recommendations       Recommendations for Other Services    Frequency 7X/week   Plan Discharge plan remains appropriate;Frequency remains appropriate    Precautions / Restrictions Precautions Precautions: Knee Required Braces or Orthoses: Knee Immobilizer - Left (ok to attempt without KI today per PA) Knee Immobilizer - Left: On when out of bed or walking Restrictions Weight Bearing Restrictions: Yes LLE Weight Bearing: Weight bearing as tolerated   Pertinent Vitals/Pain no apparent distress     Mobility  Bed Mobility Bed Mobility: Not assessed Supine to Sit: 4: Min assist;HOB flat Sitting - Scoot to Edge of Bed: 4: Min assist Details for Bed Mobility Assistance: A for LLE Transfers Sit to Stand: 4: Min guard;From chair/3-in-1;With upper extremity assist Stand to Sit: 4: Min guard;With armrests;With upper extremity assist;To chair/3-in-1 Details for Transfer Assistance: Cues for safe hand placement Ambulation/Gait Ambulation/Gait Assistance: 4: Min guard Ambulation Distance (Feet): 50 Feet Assistive device: Rolling walker Ambulation/Gait Assistance Details: CUes for posture and sequence Gait Pattern: Step-to pattern;Decreased stride length;Decreased stance time - right;Decreased step length - left;Decreased step length - right;Trunk flexed Stairs: Yes Stairs Assistance: 4: Min assist Stairs Assistance Details (indicate  cue type and reason): A for RW. Cues for technique. Husband present and able to practice with patient as well Stair Management Technique: No rails;With walker;Step to pattern;Backwards Number of Stairs: 4    Exercises Total Joint Exercises Quad Sets: Both;10 reps;Seated;AROM Short Arc QuadBarbaraann Boys;Left;10 reps;Supine Heel Slides: AAROM;Left;10 reps;Supine Hip ABduction/ADduction: AAROM;Left;10 reps;Supine Straight Leg Raises: AAROM;Right;Strengthening;5 reps;Seated   PT Diagnosis:    PT Problem List:   PT Treatment Interventions:     PT Goals    Visit Information  Last PT Received On: 10/06/12 Assistance Needed: +1    Subjective Data      Cognition  Cognition Overall Cognitive Status: Appears within functional limits for tasks assessed/performed Arousal/Alertness: Awake/alert Orientation Level: Appears intact for tasks assessed Behavior During Session: [facility] for tasks performed    Balance     End of Session PT - End of Session Equipment Utilized During Treatment: Gait belt Activity Tolerance: Patient tolerated treatment well Patient left: in chair;with family/visitor present Nurse Communication: Mobility status CPM Left Knee CPM Left Knee: Off   GP     Fredrich Birks 10/06/2012, 9:23 AM 10/06/2012 Fredrich Birks PTA [phone] pager [phone] office

## 2012-10-06 NOTE — Progress Notes (Signed)
Occupational Therapy Treatment Patient Details Name: [name] MRN: [id number] DOB: [birth year] Today's Date: 10/06/2012 Time: 7829-5621 OT Time Calculation (min): 23 min  OT Assessment / Plan / Recommendation Comments on Treatment Session This 72 yo making progress. Will benefit from [facility].    Follow Up Recommendations  Home health OT       Equipment Recommendations  None recommended by OT       Frequency Min 2X/week   Plan Discharge plan remains appropriate    Precautions / Restrictions Precautions Precautions: Knee Required Braces or Orthoses: Knee Immobilizer - Left (Per PA today she can try with it off) Knee Immobilizer - Left: On when out of bed or walking Restrictions Weight Bearing Restrictions: Yes LLE Weight Bearing: Weight bearing as tolerated       ADL  Tub/Shower Transfer: Performed;Min guard Tub/Shower Transfer Method: Science writer: Walk in shower Equipment Used: Gait belt;Rolling walker ADL Comments: Educated on in with the good leg and out with the operated leg. Also recommended a 3n1 if she decides she needs a seat in shower v. using her RW and 2 grab bars. She has a toliet riser at home      OT Goals ADL Goals ADL Goal: Web designer - Progress: Progressing toward goals ADL Goal: Additional Goal #1 - Progress: Progressing toward goals  Visit Information  Last OT Received On: 10/06/12 Assistance Needed: +1    Subjective Data  Subjective: I'm going home today      Cognition  Cognition Overall Cognitive Status: Appears within functional limits for tasks assessed/performed Arousal/Alertness: Awake/alert Orientation Level: Appears intact for tasks assessed Behavior During Session: [facility] for tasks performed    Mobility  Bed Mobility Bed Mobility: Supine to Sit;Sitting - Scoot to Edge of Bed Supine to Sit: 4: Min assist;HOB flat Sitting - Scoot to [place] of Bed: 4: Min assist Details for Bed Mobility  Assistance: A for LLE Transfers Transfers: Sit to Stand;Stand to Sit Sit to Stand: 4: Min assist;From elevated surface;With upper extremity assist;From bed Stand to Sit: 4: Min assist;With upper extremity assist;With armrests;To chair/3-in-1 Details for Transfer Assistance: Cues for safe hand placement          End of Session OT - End of Session Equipment Utilized During Treatment: Gait belt Activity Tolerance: Patient tolerated treatment well Patient left: in chair;with call bell/phone within reach CPM Left Knee CPM Left Knee: [address]      [name] [phone] 10/06/2012, 8:38 AM

## 2012-10-06 NOTE — Progress Notes (Signed)
Subjective: 3 Days Post-Op Procedure(s) (LRB): LEFT TOTAL KNEE ARTHROPLASTY (Left) Patient reports pain as mild and moderate.    Objective: Vital signs in last 24 hours: Temp:  [98.4 F (36.9 C)-99.3 F (37.4 C)] 98.4 F (36.9 C) (03/10 0611) Pulse Rate:  [75-91] 75 (03/10 0611) Resp:  [16-18] 18 (03/10 0611) BP: (122-143)/(45-58) 135/50 mmHg (03/10 0611) SpO2:  [91 %-95 %] 95 % (03/10 0611)  Intake/Output from previous day: 03/09 0701 - 03/10 0700 In: 300 [P.O.:300] Out: 150 [Drains:150] Intake/Output this shift: Total I/O In: 240 [P.O.:240] Out: -    Recent Labs  10/03/12 1526 10/04/12 0515 10/05/12 0615  HGB 10.5* 9.6* 8.4*    Recent Labs  10/04/12 0515 10/05/12 0615  WBC 10.7* 11.7*  RBC 3.42* 2.94*  HCT 29.6* 25.2*  PLT 252 218    Recent Labs  10/03/12 1526 10/04/12 0515  NA  --  137  K  --  3.7  CL  --  103  CO2  --  27  BUN  --  9  CREATININE 0.72 0.72  GLUCOSE  --  134*  CALCIUM  --  8.3*   No results found for this basename: LABPT, INR,  in the last 72 hours  Neurovascular intact Sensation intact distally Intact pulses distally Dorsiflexion/Plantar flexion intact Incision: dressing C/D/I and scant drainage No cellulitis present Compartment soft Dressing changed  Assessment/Plan: 3 Days Post-Op Procedure(s) (LRB): LEFT TOTAL KNEE ARTHROPLASTY (Left) 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

## 2012-10-06 NOTE — Discharge Summary (Signed)
PATIENT ID: [name]        MRN:  [id number]          DOB/AGE: [birth year] / 72 y.o.    DISCHARGE SUMMARY  ADMISSION DATE:    10/03/2012 DISCHARGE DATE:   10/06/2012   ADMISSION DIAGNOSIS: OA LEFT KNEE    DISCHARGE DIAGNOSIS:  OA LEFT KNEE    ADDITIONAL DIAGNOSIS: Active Problems:   Osteoarthritis of left knee  Past Medical History  Diagnosis Date  . Hypertension   . Hyperlipidemia   . Cancer     breast cancer  . Anal stenosis   . Dysrhythmia     atrial fibrilation  . Arthritis   . Atrial fibrillation   . History of cardioversion     02/04/2012  . Sleep apnea     cpap---DON'T KNOW SETTINGS  . Hypothyroidism     PROCEDURE: Procedure(s): LEFT TOTAL KNEE ARTHROPLASTY  Left  on 10/03/2012  CONSULTS:   PT/OT  HISTORY: see H&P   HOSPITAL COURSE:  [name] is a 72 y.o. admitted on 10/03/2012 and found to have a diagnosis of OA LEFT KNEE.  After appropriate laboratory studies were obtained  they were taken to the operating room on 10/03/2012 and underwent  Left Procedure(s): LEFT TOTAL KNEE ARTHROPLASTY.   They were given perioperative antibiotics:  Anti-infectives   Start     Dose/Rate Route Frequency Ordered Stop   10/03/12 1500  ceFAZolin (ANCEF) IVPB 1 g/50 mL premix    Comments:  To cover positive urine culture while inpatient   1 g 100 mL/hr over 30 Minutes Intravenous Every 6 hours 10/03/12 1301 10/05/12 1010   10/03/12 0600  ceFAZolin (ANCEF) IVPB 2 g/50 mL premix     2 g 100 mL/hr over 30 Minutes Intravenous On call to O.R. 10/02/12 1415 10/03/12 0750    .  Tolerated the procedure well.  Placed with a foley intraoperatively.  Given Ofirmev at induction and for 24 hours.    POD #1, allowed out of bed to a chair.  PT for ambulation and exercise program.  Foley D/C'd in morning.  IV saline locked.  O2 discontionued.   POD #2, continued PT and ambulation. Hemovac pulled. Dressing changed.   The remainder of the hospital course was dedicated to  ambulation and strengthening.   The patient was discharged on 3 Days Post-Op in  Stable condition.  Blood products given:  none  DIAGNOSTIC STUDIES: Recent vital signs: Patient Vitals for the past 24 hrs:  BP Temp Pulse Resp SpO2  10/06/12 0611 135/50 mmHg 98.4 F (36.9 C) 75 18 95 %  10/05/12 2139 122/45 mmHg 99.3 F (37.4 C) 79 18 93 %  10/05/12 1626 - - - 18 -  10/05/12 1338 143/48 mmHg 98.7 F (37.1 C) 91 16 91 %  10/05/12 1200 - - - 16 95 %  10/05/12 0940 130/58 mmHg - - - -       Recent laboratory studies:  Recent Labs  10/03/12 1526 10/04/12 0515 10/05/12 0615 10/06/12 0833  WBC 10.3 10.7* 11.7* 12.1*  HGB 10.5* 9.6* 8.4* 8.4*  HCT 32.0* 29.6* 25.2* 25.2*  PLT 260 252 218 249    Recent Labs  10/03/12 1526 10/04/12 0515  NA  --  137  K  --  3.7  CL  --  103  CO2  --  27  BUN  --  9  CREATININE 0.72 0.72  GLUCOSE  --  134*  CALCIUM  --  8.3*   Lab Results  Component Value Date   INR 0.97 09/25/2012   INR 2.99* 02/04/2012   INR 2.44* 11/12/2011     Recent Radiographic Studies :  Dg Chest 2 View  09/25/2012  *RADIOLOGY REPORT*  Clinical Data: Preop for left knee arthroplasty  CHEST - 2 VIEW  Comparison: 11/01/2011  Findings: Cardiomediastinal silhouette is stable.  No acute infiltrate or pleural effusion.  No pulmonary edema.  Bony thorax is stable. Surgical clips are noted than left axilla.  IMPRESSION: No active disease.  No significant change.   Original Report Authenticated By: Natasha Mead, M.D.     DISCHARGE INSTRUCTIONS:   DISCHARGE MEDICATIONS:     Medication List    TAKE these medications       acetaminophen 500 MG tablet  Commonly known as:  TYLENOL  Take 500 mg by mouth every 6 (six) hours as needed for pain.     amoxicillin 500 MG capsule  Commonly known as:  AMOXIL  Take 500 mg by mouth 3 (three) times daily.     aspirin EC 81 MG tablet  Take 81 mg by mouth daily.     atorvastatin 40 MG tablet  Commonly known as:  LIPITOR  Take 40  mg by mouth daily.     calcium carbonate 500 MG chewable tablet  Commonly known as:  TUMS - dosed in mg elemental calcium  Chew 1 tablet by mouth daily.     diltiazem 180 MG 24 hr capsule  Commonly known as:  DILACOR XR  Take 180 mg by mouth daily.     docusate sodium 100 MG capsule  Commonly known as:  COLACE  Take 300 mg by mouth at bedtime.     enoxaparin 30 MG/0.3ML injection  Commonly known as:  LOVENOX  Inject 0.3 mLs (30 mg total) into the skin every 12 (twelve) hours.     furosemide 20 MG tablet  Commonly known as:  LASIX  Take 20 mg by mouth daily.     levothyroxine 25 MCG tablet  Commonly known as:  SYNTHROID, LEVOTHROID  Take 1 tablet (25 mcg total) by mouth daily before breakfast.     multivitamin with minerals Tabs  Take 1 tablet by mouth daily.     oxyCODONE 5 MG immediate release tablet  Commonly known as:  ROXICODONE  1-2 tabs po q4-6hrs prn pain     PARoxetine 40 MG tablet  Commonly known as:  PAXIL  Take 40 mg by mouth every morning.     polyethylene glycol packet  Commonly known as:  MIRALAX / GLYCOLAX  Take 17 g by mouth daily as needed (for bowel movement).     potassium chloride SA 20 MEQ tablet  Commonly known as:  K-DUR,KLOR-CON  Take 20 mEq by mouth daily.     solifenacin 10 MG tablet  Commonly known as:  VESICARE  Take 10 mg by mouth daily as needed. Urinary frequency        FOLLOW UP VISIT:       Follow-up Information   Schedule an appointment as soon as possible for a visit with CAFFREY JR,W D, MD. (to be seen on 3/20)    Contact information:   [address] [place] [address] [phone]       DISPOSITION:  Home    CONDITION:  Stable   Margart Sickles 10/06/2012, 9:26 AM

## 2012-10-08 DIAGNOSIS — IMO0001 Reserved for inherently not codable concepts without codable children: Secondary | ICD-10-CM | POA: Diagnosis not present

## 2012-10-08 DIAGNOSIS — Z471 Aftercare following joint replacement surgery: Secondary | ICD-10-CM | POA: Diagnosis not present

## 2012-10-08 DIAGNOSIS — Z7901 Long term (current) use of anticoagulants: Secondary | ICD-10-CM | POA: Diagnosis not present

## 2012-10-09 DIAGNOSIS — Z7901 Long term (current) use of anticoagulants: Secondary | ICD-10-CM | POA: Diagnosis not present

## 2012-10-09 DIAGNOSIS — IMO0001 Reserved for inherently not codable concepts without codable children: Secondary | ICD-10-CM | POA: Diagnosis not present

## 2012-10-13 DIAGNOSIS — Z471 Aftercare following joint replacement surgery: Secondary | ICD-10-CM | POA: Diagnosis not present

## 2012-10-16 DIAGNOSIS — Z471 Aftercare following joint replacement surgery: Secondary | ICD-10-CM | POA: Diagnosis not present

## 2012-10-17 DIAGNOSIS — Z7901 Long term (current) use of anticoagulants: Secondary | ICD-10-CM | POA: Diagnosis not present

## 2012-10-17 DIAGNOSIS — IMO0001 Reserved for inherently not codable concepts without codable children: Secondary | ICD-10-CM | POA: Diagnosis not present

## 2012-10-17 DIAGNOSIS — Z471 Aftercare following joint replacement surgery: Secondary | ICD-10-CM | POA: Diagnosis not present

## 2012-10-17 DIAGNOSIS — N390 Urinary tract infection, site not specified: Secondary | ICD-10-CM | POA: Diagnosis not present

## 2012-10-17 DIAGNOSIS — Z96659 Presence of unspecified artificial knee joint: Secondary | ICD-10-CM | POA: Diagnosis not present

## 2012-10-20 DIAGNOSIS — IMO0001 Reserved for inherently not codable concepts without codable children: Secondary | ICD-10-CM | POA: Diagnosis not present

## 2012-10-20 DIAGNOSIS — Z7901 Long term (current) use of anticoagulants: Secondary | ICD-10-CM | POA: Diagnosis not present

## 2012-10-20 DIAGNOSIS — Z96659 Presence of unspecified artificial knee joint: Secondary | ICD-10-CM | POA: Diagnosis not present

## 2012-10-20 DIAGNOSIS — N390 Urinary tract infection, site not specified: Secondary | ICD-10-CM | POA: Diagnosis not present

## 2012-10-20 DIAGNOSIS — Z471 Aftercare following joint replacement surgery: Secondary | ICD-10-CM | POA: Diagnosis not present

## 2012-10-22 DIAGNOSIS — N390 Urinary tract infection, site not specified: Secondary | ICD-10-CM | POA: Diagnosis not present

## 2012-10-22 DIAGNOSIS — IMO0001 Reserved for inherently not codable concepts without codable children: Secondary | ICD-10-CM | POA: Diagnosis not present

## 2012-10-22 DIAGNOSIS — Z96659 Presence of unspecified artificial knee joint: Secondary | ICD-10-CM | POA: Diagnosis not present

## 2012-10-22 DIAGNOSIS — Z7901 Long term (current) use of anticoagulants: Secondary | ICD-10-CM | POA: Diagnosis not present

## 2012-10-22 DIAGNOSIS — Z471 Aftercare following joint replacement surgery: Secondary | ICD-10-CM | POA: Diagnosis not present

## 2012-10-24 DIAGNOSIS — Z96659 Presence of unspecified artificial knee joint: Secondary | ICD-10-CM | POA: Diagnosis not present

## 2012-10-24 DIAGNOSIS — Z471 Aftercare following joint replacement surgery: Secondary | ICD-10-CM | POA: Diagnosis not present

## 2012-10-24 DIAGNOSIS — N390 Urinary tract infection, site not specified: Secondary | ICD-10-CM | POA: Diagnosis not present

## 2012-10-24 DIAGNOSIS — Z7901 Long term (current) use of anticoagulants: Secondary | ICD-10-CM | POA: Diagnosis not present

## 2012-10-24 DIAGNOSIS — IMO0001 Reserved for inherently not codable concepts without codable children: Secondary | ICD-10-CM | POA: Diagnosis not present

## 2012-10-28 DIAGNOSIS — M1710 Unilateral primary osteoarthritis, unspecified knee: Secondary | ICD-10-CM | POA: Diagnosis not present

## 2012-10-28 DIAGNOSIS — M25569 Pain in unspecified knee: Secondary | ICD-10-CM | POA: Diagnosis not present

## 2012-10-28 DIAGNOSIS — M25669 Stiffness of unspecified knee, not elsewhere classified: Secondary | ICD-10-CM | POA: Diagnosis not present

## 2012-10-29 DIAGNOSIS — M1710 Unilateral primary osteoarthritis, unspecified knee: Secondary | ICD-10-CM | POA: Diagnosis not present

## 2012-10-29 DIAGNOSIS — M25669 Stiffness of unspecified knee, not elsewhere classified: Secondary | ICD-10-CM | POA: Diagnosis not present

## 2012-10-29 DIAGNOSIS — M25569 Pain in unspecified knee: Secondary | ICD-10-CM | POA: Diagnosis not present

## 2012-10-31 ENCOUNTER — Encounter (INDEPENDENT_AMBULATORY_CARE_PROVIDER_SITE_OTHER): Payer: Medicare Other

## 2012-10-31 ENCOUNTER — Other Ambulatory Visit: Payer: Self-pay | Admitting: Sports Medicine

## 2012-10-31 DIAGNOSIS — M79609 Pain in unspecified limb: Secondary | ICD-10-CM

## 2012-10-31 DIAGNOSIS — M79604 Pain in right leg: Secondary | ICD-10-CM

## 2012-10-31 DIAGNOSIS — M25569 Pain in unspecified knee: Secondary | ICD-10-CM | POA: Diagnosis not present

## 2012-10-31 DIAGNOSIS — M1710 Unilateral primary osteoarthritis, unspecified knee: Secondary | ICD-10-CM | POA: Diagnosis not present

## 2012-10-31 DIAGNOSIS — M25669 Stiffness of unspecified knee, not elsewhere classified: Secondary | ICD-10-CM | POA: Diagnosis not present

## 2012-10-31 DIAGNOSIS — M25579 Pain in unspecified ankle and joints of unspecified foot: Secondary | ICD-10-CM | POA: Diagnosis not present

## 2012-10-31 DIAGNOSIS — M7989 Other specified soft tissue disorders: Secondary | ICD-10-CM

## 2012-11-04 DIAGNOSIS — M1710 Unilateral primary osteoarthritis, unspecified knee: Secondary | ICD-10-CM | POA: Diagnosis not present

## 2012-11-04 DIAGNOSIS — M25669 Stiffness of unspecified knee, not elsewhere classified: Secondary | ICD-10-CM | POA: Diagnosis not present

## 2012-11-04 DIAGNOSIS — M25569 Pain in unspecified knee: Secondary | ICD-10-CM | POA: Diagnosis not present

## 2012-11-06 DIAGNOSIS — M25669 Stiffness of unspecified knee, not elsewhere classified: Secondary | ICD-10-CM | POA: Diagnosis not present

## 2012-11-06 DIAGNOSIS — M1710 Unilateral primary osteoarthritis, unspecified knee: Secondary | ICD-10-CM | POA: Diagnosis not present

## 2012-11-06 DIAGNOSIS — M25569 Pain in unspecified knee: Secondary | ICD-10-CM | POA: Diagnosis not present

## 2012-11-07 DIAGNOSIS — M25669 Stiffness of unspecified knee, not elsewhere classified: Secondary | ICD-10-CM | POA: Diagnosis not present

## 2012-11-07 DIAGNOSIS — M25569 Pain in unspecified knee: Secondary | ICD-10-CM | POA: Diagnosis not present

## 2012-11-07 DIAGNOSIS — M1710 Unilateral primary osteoarthritis, unspecified knee: Secondary | ICD-10-CM | POA: Diagnosis not present

## 2012-11-10 DIAGNOSIS — M25569 Pain in unspecified knee: Secondary | ICD-10-CM | POA: Diagnosis not present

## 2012-11-10 DIAGNOSIS — M25669 Stiffness of unspecified knee, not elsewhere classified: Secondary | ICD-10-CM | POA: Diagnosis not present

## 2012-11-10 DIAGNOSIS — M1710 Unilateral primary osteoarthritis, unspecified knee: Secondary | ICD-10-CM | POA: Diagnosis not present

## 2012-11-12 DIAGNOSIS — M25569 Pain in unspecified knee: Secondary | ICD-10-CM | POA: Diagnosis not present

## 2012-11-12 DIAGNOSIS — M25669 Stiffness of unspecified knee, not elsewhere classified: Secondary | ICD-10-CM | POA: Diagnosis not present

## 2012-11-12 DIAGNOSIS — M1710 Unilateral primary osteoarthritis, unspecified knee: Secondary | ICD-10-CM | POA: Diagnosis not present

## 2012-11-17 DIAGNOSIS — M25669 Stiffness of unspecified knee, not elsewhere classified: Secondary | ICD-10-CM | POA: Diagnosis not present

## 2012-11-17 DIAGNOSIS — M1710 Unilateral primary osteoarthritis, unspecified knee: Secondary | ICD-10-CM | POA: Diagnosis not present

## 2012-11-17 DIAGNOSIS — M25569 Pain in unspecified knee: Secondary | ICD-10-CM | POA: Diagnosis not present

## 2012-11-19 DIAGNOSIS — M25669 Stiffness of unspecified knee, not elsewhere classified: Secondary | ICD-10-CM | POA: Diagnosis not present

## 2012-11-19 DIAGNOSIS — M1710 Unilateral primary osteoarthritis, unspecified knee: Secondary | ICD-10-CM | POA: Diagnosis not present

## 2012-11-19 DIAGNOSIS — M25569 Pain in unspecified knee: Secondary | ICD-10-CM | POA: Diagnosis not present

## 2012-11-24 DIAGNOSIS — M25669 Stiffness of unspecified knee, not elsewhere classified: Secondary | ICD-10-CM | POA: Diagnosis not present

## 2012-11-24 DIAGNOSIS — M1710 Unilateral primary osteoarthritis, unspecified knee: Secondary | ICD-10-CM | POA: Diagnosis not present

## 2012-11-24 DIAGNOSIS — M25569 Pain in unspecified knee: Secondary | ICD-10-CM | POA: Diagnosis not present

## 2012-11-26 DIAGNOSIS — M1710 Unilateral primary osteoarthritis, unspecified knee: Secondary | ICD-10-CM | POA: Diagnosis not present

## 2012-11-26 DIAGNOSIS — M25669 Stiffness of unspecified knee, not elsewhere classified: Secondary | ICD-10-CM | POA: Diagnosis not present

## 2012-11-26 DIAGNOSIS — I10 Essential (primary) hypertension: Secondary | ICD-10-CM | POA: Diagnosis not present

## 2012-11-26 DIAGNOSIS — M25569 Pain in unspecified knee: Secondary | ICD-10-CM | POA: Diagnosis not present

## 2012-11-26 DIAGNOSIS — E039 Hypothyroidism, unspecified: Secondary | ICD-10-CM | POA: Diagnosis not present

## 2012-12-01 DIAGNOSIS — M25669 Stiffness of unspecified knee, not elsewhere classified: Secondary | ICD-10-CM | POA: Diagnosis not present

## 2012-12-01 DIAGNOSIS — M25569 Pain in unspecified knee: Secondary | ICD-10-CM | POA: Diagnosis not present

## 2012-12-01 DIAGNOSIS — M1710 Unilateral primary osteoarthritis, unspecified knee: Secondary | ICD-10-CM | POA: Diagnosis not present

## 2012-12-03 DIAGNOSIS — M1710 Unilateral primary osteoarthritis, unspecified knee: Secondary | ICD-10-CM | POA: Diagnosis not present

## 2012-12-03 DIAGNOSIS — M25569 Pain in unspecified knee: Secondary | ICD-10-CM | POA: Diagnosis not present

## 2012-12-03 DIAGNOSIS — M25669 Stiffness of unspecified knee, not elsewhere classified: Secondary | ICD-10-CM | POA: Diagnosis not present

## 2012-12-08 DIAGNOSIS — M1710 Unilateral primary osteoarthritis, unspecified knee: Secondary | ICD-10-CM | POA: Diagnosis not present

## 2012-12-08 DIAGNOSIS — M25669 Stiffness of unspecified knee, not elsewhere classified: Secondary | ICD-10-CM | POA: Diagnosis not present

## 2012-12-08 DIAGNOSIS — M25569 Pain in unspecified knee: Secondary | ICD-10-CM | POA: Diagnosis not present

## 2012-12-10 DIAGNOSIS — M1710 Unilateral primary osteoarthritis, unspecified knee: Secondary | ICD-10-CM | POA: Diagnosis not present

## 2012-12-10 DIAGNOSIS — M25669 Stiffness of unspecified knee, not elsewhere classified: Secondary | ICD-10-CM | POA: Diagnosis not present

## 2012-12-10 DIAGNOSIS — M25569 Pain in unspecified knee: Secondary | ICD-10-CM | POA: Diagnosis not present

## 2012-12-15 DIAGNOSIS — M25569 Pain in unspecified knee: Secondary | ICD-10-CM | POA: Diagnosis not present

## 2012-12-15 DIAGNOSIS — M1710 Unilateral primary osteoarthritis, unspecified knee: Secondary | ICD-10-CM | POA: Diagnosis not present

## 2012-12-15 DIAGNOSIS — M25669 Stiffness of unspecified knee, not elsewhere classified: Secondary | ICD-10-CM | POA: Diagnosis not present

## 2012-12-17 DIAGNOSIS — M545 Low back pain, unspecified: Secondary | ICD-10-CM | POA: Diagnosis not present

## 2012-12-17 DIAGNOSIS — M76899 Other specified enthesopathies of unspecified lower limb, excluding foot: Secondary | ICD-10-CM | POA: Diagnosis not present

## 2012-12-18 DIAGNOSIS — M25669 Stiffness of unspecified knee, not elsewhere classified: Secondary | ICD-10-CM | POA: Diagnosis not present

## 2012-12-18 DIAGNOSIS — M25569 Pain in unspecified knee: Secondary | ICD-10-CM | POA: Diagnosis not present

## 2012-12-18 DIAGNOSIS — M1710 Unilateral primary osteoarthritis, unspecified knee: Secondary | ICD-10-CM | POA: Diagnosis not present

## 2012-12-23 DIAGNOSIS — M1710 Unilateral primary osteoarthritis, unspecified knee: Secondary | ICD-10-CM | POA: Diagnosis not present

## 2012-12-23 DIAGNOSIS — M76899 Other specified enthesopathies of unspecified lower limb, excluding foot: Secondary | ICD-10-CM | POA: Diagnosis not present

## 2012-12-23 DIAGNOSIS — M25669 Stiffness of unspecified knee, not elsewhere classified: Secondary | ICD-10-CM | POA: Diagnosis not present

## 2012-12-23 DIAGNOSIS — M25559 Pain in unspecified hip: Secondary | ICD-10-CM | POA: Diagnosis not present

## 2012-12-23 DIAGNOSIS — M25569 Pain in unspecified knee: Secondary | ICD-10-CM | POA: Diagnosis not present

## 2012-12-25 DIAGNOSIS — Z96659 Presence of unspecified artificial knee joint: Secondary | ICD-10-CM | POA: Diagnosis not present

## 2012-12-25 DIAGNOSIS — Z471 Aftercare following joint replacement surgery: Secondary | ICD-10-CM | POA: Diagnosis not present

## 2012-12-26 DIAGNOSIS — M76899 Other specified enthesopathies of unspecified lower limb, excluding foot: Secondary | ICD-10-CM | POA: Diagnosis not present

## 2012-12-26 DIAGNOSIS — M25559 Pain in unspecified hip: Secondary | ICD-10-CM | POA: Diagnosis not present

## 2012-12-26 DIAGNOSIS — M25569 Pain in unspecified knee: Secondary | ICD-10-CM | POA: Diagnosis not present

## 2012-12-26 DIAGNOSIS — M1710 Unilateral primary osteoarthritis, unspecified knee: Secondary | ICD-10-CM | POA: Diagnosis not present

## 2012-12-26 DIAGNOSIS — M25669 Stiffness of unspecified knee, not elsewhere classified: Secondary | ICD-10-CM | POA: Diagnosis not present

## 2012-12-29 DIAGNOSIS — M25669 Stiffness of unspecified knee, not elsewhere classified: Secondary | ICD-10-CM | POA: Diagnosis not present

## 2012-12-29 DIAGNOSIS — M25569 Pain in unspecified knee: Secondary | ICD-10-CM | POA: Diagnosis not present

## 2012-12-29 DIAGNOSIS — M1710 Unilateral primary osteoarthritis, unspecified knee: Secondary | ICD-10-CM | POA: Diagnosis not present

## 2013-01-06 DIAGNOSIS — M25569 Pain in unspecified knee: Secondary | ICD-10-CM | POA: Diagnosis not present

## 2013-01-06 DIAGNOSIS — M1710 Unilateral primary osteoarthritis, unspecified knee: Secondary | ICD-10-CM | POA: Diagnosis not present

## 2013-01-06 DIAGNOSIS — M25669 Stiffness of unspecified knee, not elsewhere classified: Secondary | ICD-10-CM | POA: Diagnosis not present

## 2013-01-08 DIAGNOSIS — M1710 Unilateral primary osteoarthritis, unspecified knee: Secondary | ICD-10-CM | POA: Diagnosis not present

## 2013-01-08 DIAGNOSIS — M25569 Pain in unspecified knee: Secondary | ICD-10-CM | POA: Diagnosis not present

## 2013-01-08 DIAGNOSIS — M25669 Stiffness of unspecified knee, not elsewhere classified: Secondary | ICD-10-CM | POA: Diagnosis not present

## 2013-01-19 DIAGNOSIS — M1710 Unilateral primary osteoarthritis, unspecified knee: Secondary | ICD-10-CM | POA: Diagnosis not present

## 2013-01-19 DIAGNOSIS — M25669 Stiffness of unspecified knee, not elsewhere classified: Secondary | ICD-10-CM | POA: Diagnosis not present

## 2013-01-19 DIAGNOSIS — M25569 Pain in unspecified knee: Secondary | ICD-10-CM | POA: Diagnosis not present

## 2013-01-21 DIAGNOSIS — M25569 Pain in unspecified knee: Secondary | ICD-10-CM | POA: Diagnosis not present

## 2013-01-21 DIAGNOSIS — M1710 Unilateral primary osteoarthritis, unspecified knee: Secondary | ICD-10-CM | POA: Diagnosis not present

## 2013-01-21 DIAGNOSIS — M25669 Stiffness of unspecified knee, not elsewhere classified: Secondary | ICD-10-CM | POA: Diagnosis not present

## 2013-01-21 IMAGING — CT CT PELVIS W/O CM
2 of 3 series · 13 of 32 positions shown, 18 images · non-contrast
Comparison: Pelvic CT 11/01/2011.

CLINICAL DATA: Increasing leukocytosis and rectal discomfort
status post irrigation & drainage for necrotizing soft tissue
infection.

CT PELVIS WITHOUT CONTRAST
TECHNIQUE: Multidetector CT imaging of the pelvis was performed
following the standard protocol without intravenous contrast.

[Series 2: routine abdomen · axial · 0.98mm/px · z∈[-537,-317]mm · 5 of 67 slices shown, 10 images]
[im 12/67  soft-tissue]
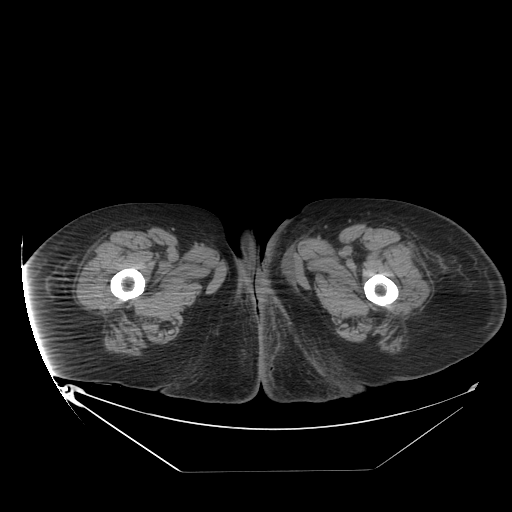
[im 12/67  bone]
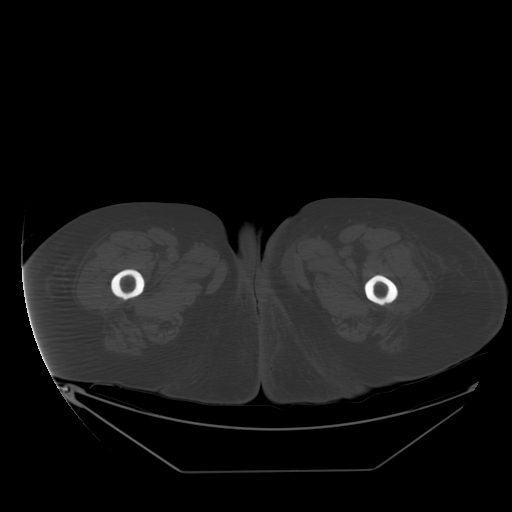
[im 23/67  soft-tissue]
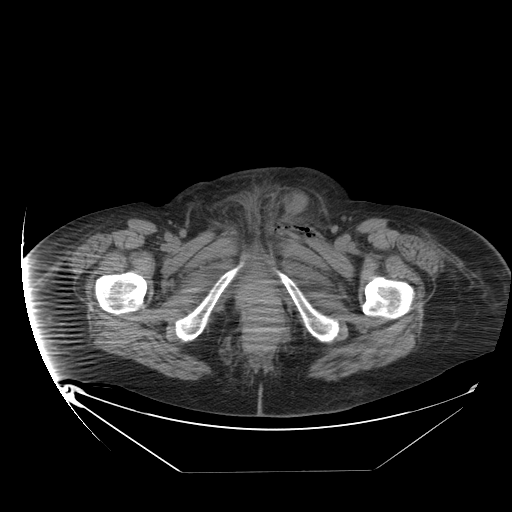
[im 23/67  lung]
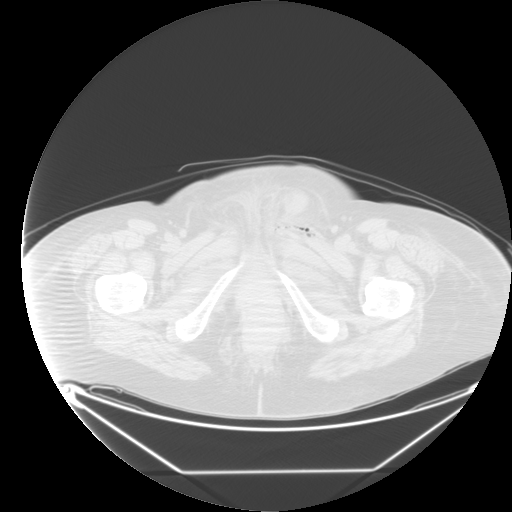
[im 34/67  soft-tissue]
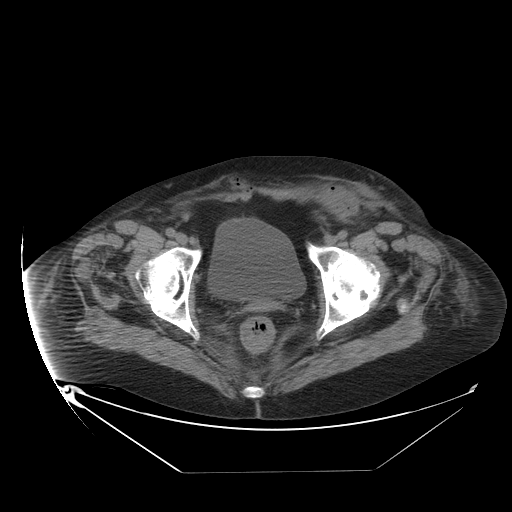
[im 34/67  lung]
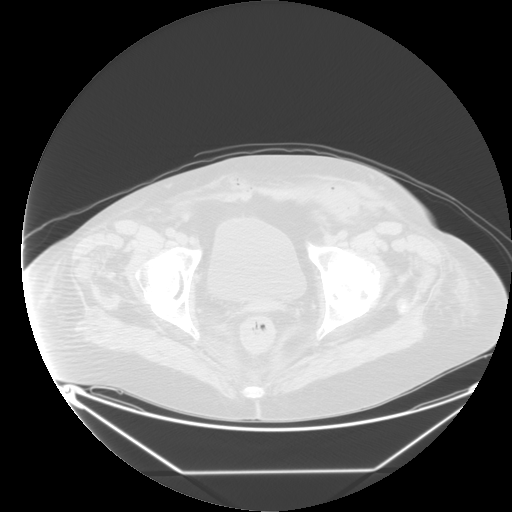
[im 45/67  soft-tissue]
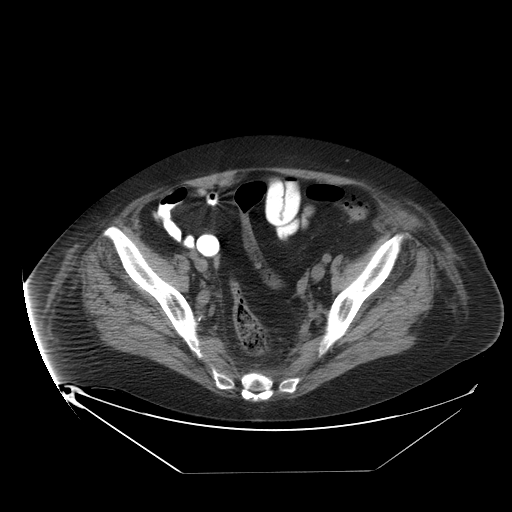
[im 45/67  lung]
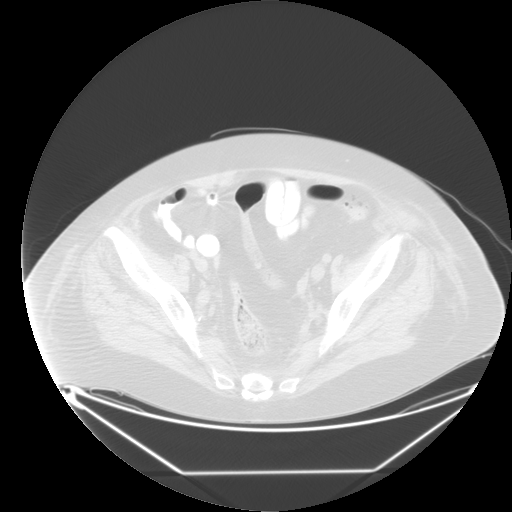
[im 56/67  soft-tissue]
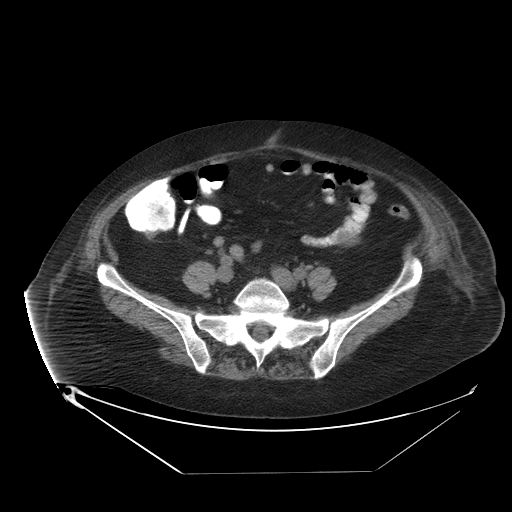
[im 56/67  lung]
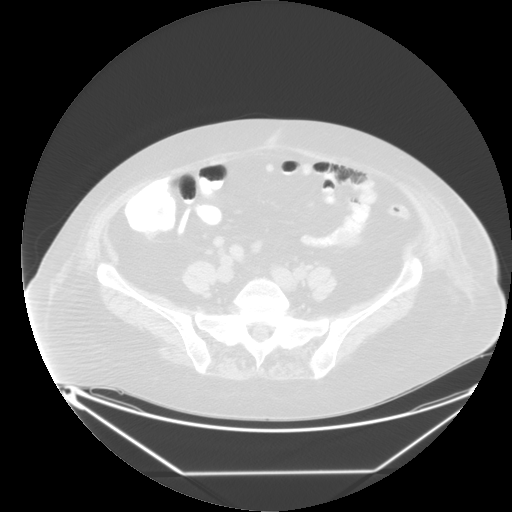

[Series 401: sag · sagittal · 0.98mm/px · 8 of 148 slices shown]
[im 12/148  soft-tissue]
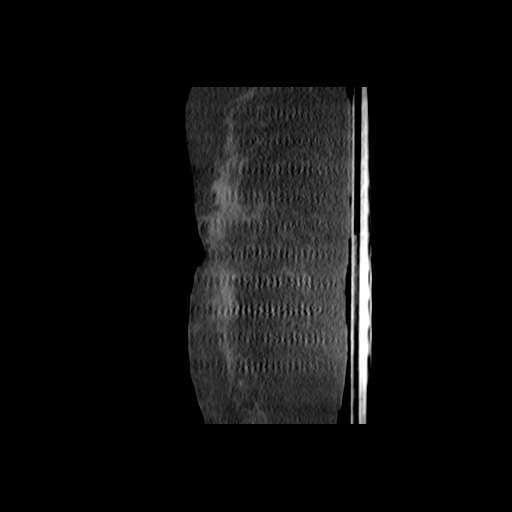
[im 34/148  soft-tissue]
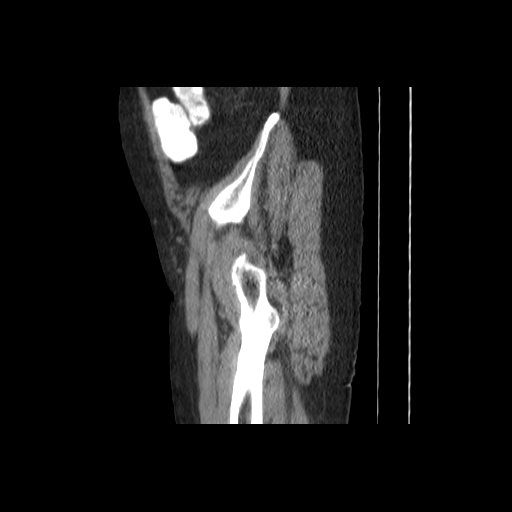
[im 46/148  soft-tissue]
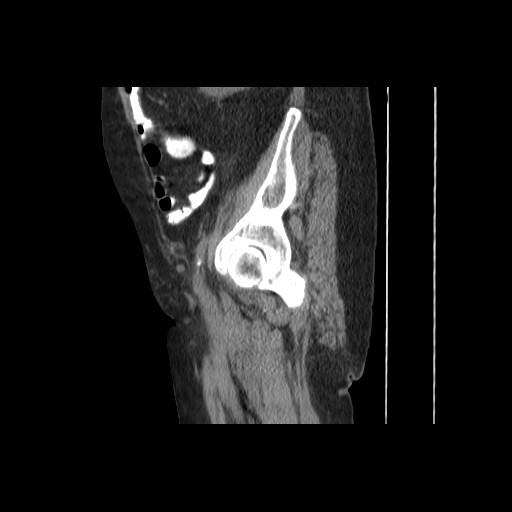
[im 68/148  soft-tissue]
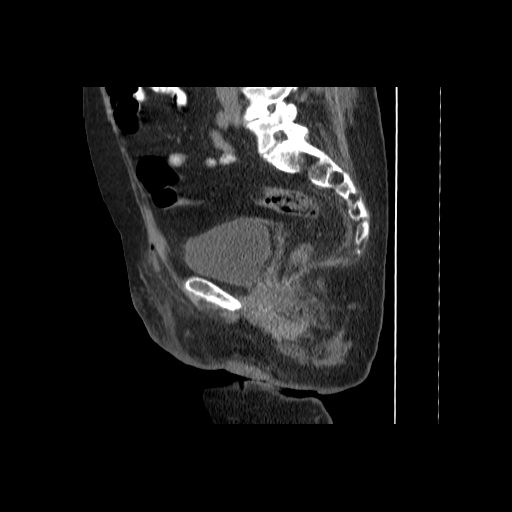
[im 80/148  soft-tissue]
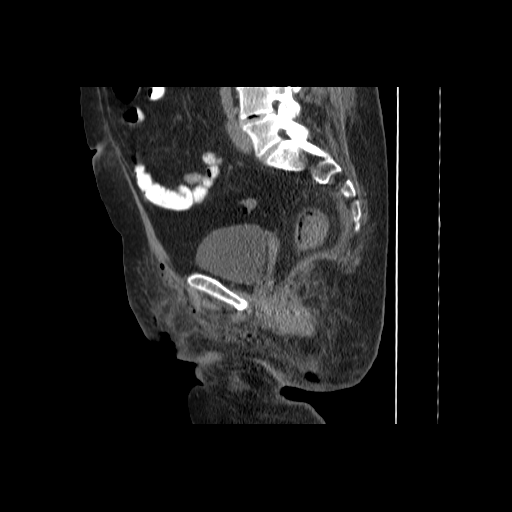
[im 102/148  soft-tissue]
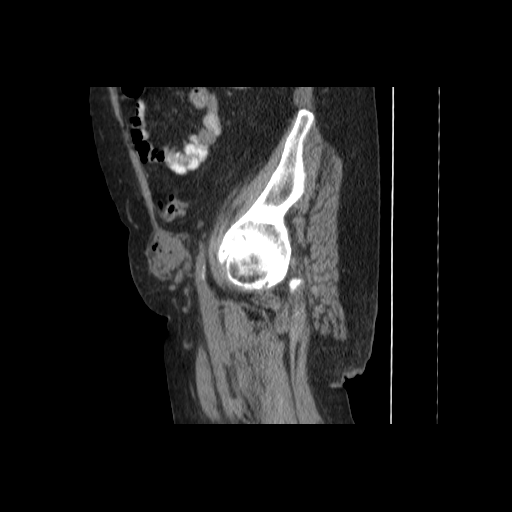
[im 114/148  soft-tissue]
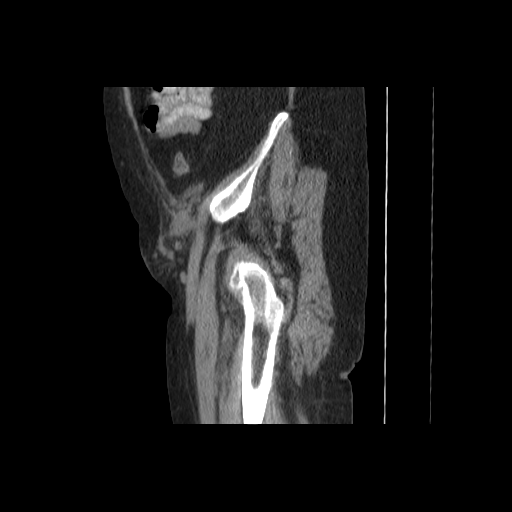
[im 136/148  soft-tissue]
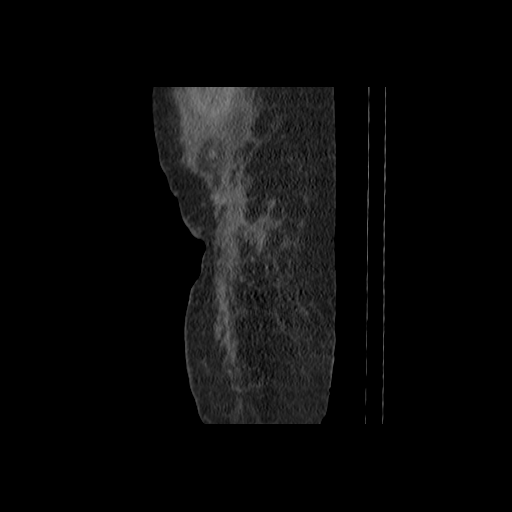

[13 of 32 positions shown; findings below may reference images not displayed]

FINDINGS: There has been interval improvement in the extensive
soft tissue emphysema involving the inguinal and perineal regions,
left greater than right.  There is a small fluid collection within
the left inguinal canal, measuring 4.6 cm on image 33.  There is
also a small amount of fluid within the right ischiorectal fossa,
measuring 3.5 cm on image 37. No progressive cellulitis is evident.

There is no evidence of intrapelvic fluid collection.  The bladder
appears normal.  Sigmoid colon diverticular changes are stable
without surrounding inflammation.

No acute osseous findings are demonstrated.  There are stable
asymmetric degenerative changes of the left hip.  Mild sacroiliac
degenerative changes are stable.
IMPRESSION: 1.  Interval improvement in the perineal and inguinal soft tissue
emphysema.
2.  Small fluid collections in the left inguinal canal and right
ischiofemoral fossa.
3.  No evidence of intrapelvic abscess or osteomyelitis.

## 2013-02-05 DIAGNOSIS — M25559 Pain in unspecified hip: Secondary | ICD-10-CM | POA: Diagnosis not present

## 2013-02-05 DIAGNOSIS — M25469 Effusion, unspecified knee: Secondary | ICD-10-CM | POA: Diagnosis not present

## 2013-02-05 DIAGNOSIS — M1710 Unilateral primary osteoarthritis, unspecified knee: Secondary | ICD-10-CM | POA: Diagnosis not present

## 2013-02-05 DIAGNOSIS — M25569 Pain in unspecified knee: Secondary | ICD-10-CM | POA: Diagnosis not present

## 2013-02-10 DIAGNOSIS — M1710 Unilateral primary osteoarthritis, unspecified knee: Secondary | ICD-10-CM | POA: Diagnosis not present

## 2013-02-11 DIAGNOSIS — M25559 Pain in unspecified hip: Secondary | ICD-10-CM | POA: Diagnosis not present

## 2013-02-16 ENCOUNTER — Ambulatory Visit (HOSPITAL_COMMUNITY)
Admission: RE | Admit: 2013-02-16 | Discharge: 2013-02-16 | Disposition: A | Discharge status: Home / Self Care | Payer: Medicare Other | Source: Ambulatory Visit | Attending: Geriatric Medicine | Admitting: Geriatric Medicine

## 2013-02-16 ENCOUNTER — Other Ambulatory Visit (HOSPITAL_COMMUNITY): Payer: Self-pay | Admitting: Geriatric Medicine

## 2013-02-16 DIAGNOSIS — I872 Venous insufficiency (chronic) (peripheral): Secondary | ICD-10-CM | POA: Diagnosis not present

## 2013-02-16 DIAGNOSIS — M7989 Other specified soft tissue disorders: Secondary | ICD-10-CM | POA: Diagnosis not present

## 2013-02-16 DIAGNOSIS — M25569 Pain in unspecified knee: Secondary | ICD-10-CM | POA: Diagnosis not present

## 2013-02-16 DIAGNOSIS — M25562 Pain in left knee: Secondary | ICD-10-CM

## 2013-02-16 DIAGNOSIS — I4891 Unspecified atrial fibrillation: Secondary | ICD-10-CM | POA: Diagnosis not present

## 2013-02-16 DIAGNOSIS — I10 Essential (primary) hypertension: Secondary | ICD-10-CM | POA: Diagnosis not present

## 2013-02-16 DIAGNOSIS — R609 Edema, unspecified: Secondary | ICD-10-CM | POA: Diagnosis not present

## 2013-02-16 DIAGNOSIS — Z79899 Other long term (current) drug therapy: Secondary | ICD-10-CM | POA: Diagnosis not present

## 2013-02-16 NOTE — Progress Notes (Signed)
VASCULAR LAB PRELIMINARY  PRELIMINARY  PRELIMINARY  PRELIMINARY  Left lower extremity venous Doppler completed.    Preliminary report:  There is no DVT or SVT noted in the left lower extremity.  Phoenicia Pirie, RVT 02/16/2013, 2:58 PM

## 2013-03-09 DIAGNOSIS — M25559 Pain in unspecified hip: Secondary | ICD-10-CM | POA: Diagnosis not present

## 2013-03-19 DIAGNOSIS — E782 Mixed hyperlipidemia: Secondary | ICD-10-CM | POA: Diagnosis not present

## 2013-03-19 DIAGNOSIS — I10 Essential (primary) hypertension: Secondary | ICD-10-CM | POA: Diagnosis not present

## 2013-03-19 DIAGNOSIS — I4891 Unspecified atrial fibrillation: Secondary | ICD-10-CM | POA: Diagnosis not present

## 2013-03-19 DIAGNOSIS — Z7901 Long term (current) use of anticoagulants: Secondary | ICD-10-CM | POA: Diagnosis not present

## 2013-03-25 DIAGNOSIS — I872 Venous insufficiency (chronic) (peripheral): Secondary | ICD-10-CM | POA: Diagnosis not present

## 2013-03-25 DIAGNOSIS — Z1331 Encounter for screening for depression: Secondary | ICD-10-CM | POA: Diagnosis not present

## 2013-03-25 DIAGNOSIS — E7800 Pure hypercholesterolemia, unspecified: Secondary | ICD-10-CM | POA: Diagnosis not present

## 2013-03-25 DIAGNOSIS — Z853 Personal history of malignant neoplasm of breast: Secondary | ICD-10-CM | POA: Diagnosis not present

## 2013-03-25 DIAGNOSIS — Z0000 Encounter for general adult medical examination without abnormal findings: Secondary | ICD-10-CM | POA: Diagnosis not present

## 2013-03-25 DIAGNOSIS — I10 Essential (primary) hypertension: Secondary | ICD-10-CM | POA: Diagnosis not present

## 2013-04-10 DIAGNOSIS — E7800 Pure hypercholesterolemia, unspecified: Secondary | ICD-10-CM | POA: Diagnosis not present

## 2013-04-10 DIAGNOSIS — Z79899 Other long term (current) drug therapy: Secondary | ICD-10-CM | POA: Diagnosis not present

## 2013-04-10 DIAGNOSIS — R0602 Shortness of breath: Secondary | ICD-10-CM | POA: Diagnosis not present

## 2013-04-10 DIAGNOSIS — R609 Edema, unspecified: Secondary | ICD-10-CM | POA: Diagnosis not present

## 2013-04-10 DIAGNOSIS — G4730 Sleep apnea, unspecified: Secondary | ICD-10-CM | POA: Diagnosis not present

## 2013-04-10 DIAGNOSIS — M2550 Pain in unspecified joint: Secondary | ICD-10-CM | POA: Diagnosis not present

## 2013-04-10 DIAGNOSIS — I10 Essential (primary) hypertension: Secondary | ICD-10-CM | POA: Diagnosis not present

## 2013-04-13 DIAGNOSIS — I10 Essential (primary) hypertension: Secondary | ICD-10-CM | POA: Diagnosis not present

## 2013-04-22 ENCOUNTER — Other Ambulatory Visit: Payer: Self-pay | Admitting: Interventional Cardiology

## 2013-04-22 DIAGNOSIS — Z79899 Other long term (current) drug therapy: Secondary | ICD-10-CM

## 2013-04-27 ENCOUNTER — Other Ambulatory Visit (HOSPITAL_COMMUNITY): Payer: Self-pay | Admitting: Geriatric Medicine

## 2013-04-27 DIAGNOSIS — R0600 Dyspnea, unspecified: Secondary | ICD-10-CM

## 2013-04-29 ENCOUNTER — Other Ambulatory Visit (INDEPENDENT_AMBULATORY_CARE_PROVIDER_SITE_OTHER): Payer: Medicare Other

## 2013-04-29 ENCOUNTER — Other Ambulatory Visit (HOSPITAL_COMMUNITY): Payer: Self-pay | Admitting: Geriatric Medicine

## 2013-04-29 ENCOUNTER — Telehealth: Payer: Self-pay

## 2013-04-29 ENCOUNTER — Ambulatory Visit (HOSPITAL_COMMUNITY): Payer: Medicare Other | Attending: Cardiovascular Disease

## 2013-04-29 DIAGNOSIS — I10 Essential (primary) hypertension: Secondary | ICD-10-CM | POA: Insufficient documentation

## 2013-04-29 DIAGNOSIS — I4891 Unspecified atrial fibrillation: Secondary | ICD-10-CM | POA: Diagnosis not present

## 2013-04-29 DIAGNOSIS — I059 Rheumatic mitral valve disease, unspecified: Secondary | ICD-10-CM | POA: Insufficient documentation

## 2013-04-29 DIAGNOSIS — R0602 Shortness of breath: Secondary | ICD-10-CM | POA: Diagnosis not present

## 2013-04-29 DIAGNOSIS — I379 Nonrheumatic pulmonary valve disorder, unspecified: Secondary | ICD-10-CM | POA: Insufficient documentation

## 2013-04-29 DIAGNOSIS — C50919 Malignant neoplasm of unspecified site of unspecified female breast: Secondary | ICD-10-CM | POA: Insufficient documentation

## 2013-04-29 DIAGNOSIS — R609 Edema, unspecified: Secondary | ICD-10-CM | POA: Diagnosis not present

## 2013-04-29 DIAGNOSIS — R0989 Other specified symptoms and signs involving the circulatory and respiratory systems: Secondary | ICD-10-CM | POA: Insufficient documentation

## 2013-04-29 DIAGNOSIS — E785 Hyperlipidemia, unspecified: Secondary | ICD-10-CM | POA: Diagnosis not present

## 2013-04-29 DIAGNOSIS — I079 Rheumatic tricuspid valve disease, unspecified: Secondary | ICD-10-CM | POA: Diagnosis not present

## 2013-04-29 DIAGNOSIS — R0600 Dyspnea, unspecified: Secondary | ICD-10-CM

## 2013-04-29 DIAGNOSIS — Z79899 Other long term (current) drug therapy: Secondary | ICD-10-CM

## 2013-04-29 DIAGNOSIS — R0609 Other forms of dyspnea: Secondary | ICD-10-CM | POA: Insufficient documentation

## 2013-04-29 LAB — BASIC METABOLIC PANEL
Chloride: 105 mEq/L (ref 96–112)
GFR: 85.56 mL/min (ref >60.00)
Glucose, Bld: 92 mg/dL (ref 70–99)
Potassium: 4.2 mEq/L (ref 3.5–5.1)
Sodium: 139 mEq/L (ref 135–145)

## 2013-04-29 LAB — CBC
HCT: 36.7 % (ref 36.0–46.0)
Hemoglobin: 12.4 g/dL (ref 12.0–15.0)
MCV: 87.3 fl (ref 78.0–100.0)
RBC: 4.2 Mil/uL (ref 3.87–5.11)
RDW: 16.3 % — ABNORMAL HIGH (ref 11.5–14.6)
WBC: 7.7 10*3/uL (ref 4.5–10.5)

## 2013-04-29 NOTE — Progress Notes (Signed)
Echocardiogram performed.  

## 2013-04-29 NOTE — Telephone Encounter (Signed)
Message copied by Jarvis Newcomer on Wed Apr 29, 2013  3:24 PM ------      Message from: Verdis Prime      Created: Wed Apr 29, 2013  2:06 PM       Labs are ok. ------

## 2013-04-30 ENCOUNTER — Telehealth: Payer: Self-pay

## 2013-04-30 ENCOUNTER — Other Ambulatory Visit: Payer: Medicare Other

## 2013-04-30 NOTE — Telephone Encounter (Signed)
Pt given lab results.Labs are ok. Pt verbalized understanding.

## 2013-05-15 DIAGNOSIS — I4891 Unspecified atrial fibrillation: Secondary | ICD-10-CM | POA: Diagnosis not present

## 2013-05-15 DIAGNOSIS — M1710 Unilateral primary osteoarthritis, unspecified knee: Secondary | ICD-10-CM | POA: Diagnosis not present

## 2013-05-15 DIAGNOSIS — I872 Venous insufficiency (chronic) (peripheral): Secondary | ICD-10-CM | POA: Diagnosis not present

## 2013-05-15 DIAGNOSIS — I10 Essential (primary) hypertension: Secondary | ICD-10-CM | POA: Diagnosis not present

## 2013-05-20 DIAGNOSIS — M79609 Pain in unspecified limb: Secondary | ICD-10-CM | POA: Diagnosis not present

## 2013-05-20 DIAGNOSIS — M169 Osteoarthritis of hip, unspecified: Secondary | ICD-10-CM | POA: Diagnosis not present

## 2013-05-20 DIAGNOSIS — M25559 Pain in unspecified hip: Secondary | ICD-10-CM | POA: Diagnosis not present

## 2013-05-20 DIAGNOSIS — Z96659 Presence of unspecified artificial knee joint: Secondary | ICD-10-CM | POA: Diagnosis not present

## 2013-05-20 DIAGNOSIS — M25569 Pain in unspecified knee: Secondary | ICD-10-CM | POA: Diagnosis not present

## 2013-05-20 DIAGNOSIS — M545 Low back pain: Secondary | ICD-10-CM | POA: Diagnosis not present

## 2013-05-21 DIAGNOSIS — M2550 Pain in unspecified joint: Secondary | ICD-10-CM | POA: Diagnosis not present

## 2013-05-21 DIAGNOSIS — Z79899 Other long term (current) drug therapy: Secondary | ICD-10-CM | POA: Diagnosis not present

## 2013-05-22 ENCOUNTER — Other Ambulatory Visit: Payer: Self-pay | Admitting: Interventional Cardiology

## 2013-05-28 DIAGNOSIS — M47817 Spondylosis without myelopathy or radiculopathy, lumbosacral region: Secondary | ICD-10-CM | POA: Diagnosis not present

## 2013-05-28 DIAGNOSIS — M519 Unspecified thoracic, thoracolumbar and lumbosacral intervertebral disc disorder: Secondary | ICD-10-CM | POA: Diagnosis not present

## 2013-05-28 DIAGNOSIS — M51379 Other intervertebral disc degeneration, lumbosacral region without mention of lumbar back pain or lower extremity pain: Secondary | ICD-10-CM | POA: Diagnosis not present

## 2013-05-28 DIAGNOSIS — M5137 Other intervertebral disc degeneration, lumbosacral region: Secondary | ICD-10-CM | POA: Diagnosis not present

## 2013-05-28 DIAGNOSIS — M5126 Other intervertebral disc displacement, lumbar region: Secondary | ICD-10-CM | POA: Diagnosis not present

## 2013-06-08 DIAGNOSIS — C50919 Malignant neoplasm of unspecified site of unspecified female breast: Secondary | ICD-10-CM | POA: Diagnosis not present

## 2013-06-08 DIAGNOSIS — Z803 Family history of malignant neoplasm of breast: Secondary | ICD-10-CM | POA: Diagnosis not present

## 2013-06-10 DIAGNOSIS — M1610 Unilateral primary osteoarthritis, unspecified hip: Secondary | ICD-10-CM | POA: Diagnosis not present

## 2013-06-10 DIAGNOSIS — M5137 Other intervertebral disc degeneration, lumbosacral region: Secondary | ICD-10-CM | POA: Diagnosis not present

## 2013-06-10 DIAGNOSIS — Z5189 Encounter for other specified aftercare: Secondary | ICD-10-CM | POA: Diagnosis not present

## 2013-06-11 ENCOUNTER — Encounter: Payer: Self-pay | Admitting: Interventional Cardiology

## 2013-06-22 DIAGNOSIS — E669 Obesity, unspecified: Secondary | ICD-10-CM | POA: Diagnosis not present

## 2013-06-22 DIAGNOSIS — I10 Essential (primary) hypertension: Secondary | ICD-10-CM | POA: Diagnosis not present

## 2013-06-22 DIAGNOSIS — M1710 Unilateral primary osteoarthritis, unspecified knee: Secondary | ICD-10-CM | POA: Diagnosis not present

## 2013-06-22 DIAGNOSIS — H903 Sensorineural hearing loss, bilateral: Secondary | ICD-10-CM | POA: Diagnosis not present

## 2013-06-22 DIAGNOSIS — R232 Flushing: Secondary | ICD-10-CM | POA: Diagnosis not present

## 2013-06-30 DIAGNOSIS — H6120 Impacted cerumen, unspecified ear: Secondary | ICD-10-CM | POA: Diagnosis not present

## 2013-07-02 DIAGNOSIS — H6120 Impacted cerumen, unspecified ear: Secondary | ICD-10-CM | POA: Diagnosis not present

## 2013-07-13 DIAGNOSIS — M25569 Pain in unspecified knee: Secondary | ICD-10-CM | POA: Diagnosis not present

## 2013-07-19 ENCOUNTER — Other Ambulatory Visit: Payer: Self-pay | Admitting: Interventional Cardiology

## 2013-07-31 DIAGNOSIS — L821 Other seborrheic keratosis: Secondary | ICD-10-CM | POA: Diagnosis not present

## 2013-08-05 ENCOUNTER — Telehealth: Payer: Self-pay | Admitting: Interventional Cardiology

## 2013-08-05 DIAGNOSIS — Z5189 Encounter for other specified aftercare: Secondary | ICD-10-CM | POA: Diagnosis not present

## 2013-08-05 DIAGNOSIS — Z96659 Presence of unspecified artificial knee joint: Secondary | ICD-10-CM | POA: Diagnosis not present

## 2013-08-05 DIAGNOSIS — M1710 Unilateral primary osteoarthritis, unspecified knee: Secondary | ICD-10-CM | POA: Diagnosis not present

## 2013-08-05 DIAGNOSIS — M1610 Unilateral primary osteoarthritis, unspecified hip: Secondary | ICD-10-CM | POA: Diagnosis not present

## 2013-08-05 NOTE — Telephone Encounter (Signed)
New Message  Pt husband called -- pt will have a Hip injections at [facility].  Requests clearance to come off of coumadin. Please call back to discuss.

## 2013-08-06 ENCOUNTER — Telehealth: Payer: Self-pay | Admitting: Interventional Cardiology

## 2013-08-06 NOTE — Telephone Encounter (Signed)
New Problem:  Pt is having surgery on her left hip at [facility] and her husband is calling to get clearance from Dr. Tamala Julian... He is also calling to find out when she needs to stop her Elliquis Rx.... States she does not have a date for surgery yet.Marland KitchenMarland Kitchen

## 2013-08-06 NOTE — Telephone Encounter (Signed)
returned call to pt. pt husband given Dr.McLean recommendations.call The orthopaedic doctor at [facility].let them know she took her morning dose of Eliquis.ok to follow thier recommendation on proceeding with hip injections if they think it is ok to do so

## 2013-08-06 NOTE — Telephone Encounter (Signed)
returned pt call.pt sts that she has taken Eliquis this morning and is scheduled to have her hip injection tomorrow at [facility].adv pt that Dr.Smith will not be in the office until next week, I will talk with another physician in the office and call back with their recommendations.pt verbalized understanding.

## 2013-08-06 NOTE — Telephone Encounter (Signed)
Pt was told cma/dr will be in Tuesday 1/13 and will call him back if an appointment will be needed or if he can give consent from last ov. Husband verbalized understanding and was accepting of plan.

## 2013-08-07 ENCOUNTER — Encounter: Payer: Self-pay | Admitting: Interventional Cardiology

## 2013-08-07 NOTE — Telephone Encounter (Signed)
1/23 lidocaine and steroid in hip at [facility]--- This encounter was created in error - please disregard.

## 2013-08-07 NOTE — Telephone Encounter (Signed)
The pt's husband called to let us know that the pt's injection has been rescheduled at [facility] to 08/21/13.  The pt will be getting a lidocaine/steroid injection into her hip and the MD at [facility] needs approval and instructions for the pt to hold Eliquis prior to procedure.  I made him aware that once again Dr Tamala Julian will be back in the office next week and have him address this issue at this time.

## 2013-08-07 NOTE — Telephone Encounter (Signed)
New message    On eliquis and need injection into leg.  [facility] want pt to stop eliquis for several days so that she will not bruise

## 2013-08-09 NOTE — Telephone Encounter (Signed)
Stop Eliquis 48 - 72 hours prior to procedure. Resume 48 hours post procedure or when performing physician feels safe to resume.

## 2013-08-10 ENCOUNTER — Telehealth: Payer: Self-pay | Admitting: Interventional Cardiology

## 2013-08-10 NOTE — Telephone Encounter (Signed)
Pt was informed with recommendations, she will call back if she needs msg faxed to [email or web address] .

## 2013-08-10 NOTE — Telephone Encounter (Signed)
Called transferred from [place] at 8:37 am patient wants note from Dr. Tamala Julian faxed to Dr. Gae Gallop at [facility]. Information provided was taken to [facility], she printed information for faxing. I called and spoke with Peggy to verify the fax number.   ToMeriam Sprague number: [phone] Phone [phone] Attention:Dr. Evalina Field Stanley/Peggy

## 2013-08-21 DIAGNOSIS — M24159 Other articular cartilage disorders, unspecified hip: Secondary | ICD-10-CM | POA: Diagnosis not present

## 2013-08-26 DIAGNOSIS — Z96659 Presence of unspecified artificial knee joint: Secondary | ICD-10-CM | POA: Diagnosis not present

## 2013-08-26 DIAGNOSIS — R9389 Abnormal findings on diagnostic imaging of other specified body structures: Secondary | ICD-10-CM | POA: Diagnosis not present

## 2013-08-26 DIAGNOSIS — Z471 Aftercare following joint replacement surgery: Secondary | ICD-10-CM | POA: Diagnosis not present

## 2013-08-26 DIAGNOSIS — R937 Abnormal findings on diagnostic imaging of other parts of musculoskeletal system: Secondary | ICD-10-CM | POA: Diagnosis not present

## 2013-09-02 DIAGNOSIS — M1610 Unilateral primary osteoarthritis, unspecified hip: Secondary | ICD-10-CM | POA: Diagnosis not present

## 2013-09-02 DIAGNOSIS — Z5189 Encounter for other specified aftercare: Secondary | ICD-10-CM | POA: Diagnosis not present

## 2013-09-13 ENCOUNTER — Encounter: Payer: Self-pay | Admitting: Interventional Cardiology

## 2013-09-18 ENCOUNTER — Ambulatory Visit: Payer: Medicare Other | Admitting: Interventional Cardiology

## 2013-09-22 ENCOUNTER — Ambulatory Visit: Payer: Medicare Other | Admitting: Interventional Cardiology

## 2013-09-23 DIAGNOSIS — M1710 Unilateral primary osteoarthritis, unspecified knee: Secondary | ICD-10-CM | POA: Diagnosis not present

## 2013-09-23 DIAGNOSIS — I10 Essential (primary) hypertension: Secondary | ICD-10-CM | POA: Diagnosis not present

## 2013-09-23 DIAGNOSIS — I872 Venous insufficiency (chronic) (peripheral): Secondary | ICD-10-CM | POA: Diagnosis not present

## 2013-09-23 DIAGNOSIS — IMO0002 Reserved for concepts with insufficient information to code with codable children: Secondary | ICD-10-CM | POA: Diagnosis not present

## 2013-09-30 DIAGNOSIS — R609 Edema, unspecified: Secondary | ICD-10-CM | POA: Diagnosis not present

## 2013-10-14 DIAGNOSIS — I10 Essential (primary) hypertension: Secondary | ICD-10-CM | POA: Diagnosis not present

## 2013-10-14 DIAGNOSIS — R609 Edema, unspecified: Secondary | ICD-10-CM | POA: Diagnosis not present

## 2013-10-14 DIAGNOSIS — Z79899 Other long term (current) drug therapy: Secondary | ICD-10-CM | POA: Diagnosis not present

## 2013-10-14 DIAGNOSIS — I4891 Unspecified atrial fibrillation: Secondary | ICD-10-CM | POA: Diagnosis not present

## 2013-11-09 ENCOUNTER — Encounter: Payer: Self-pay | Admitting: Interventional Cardiology

## 2013-11-09 ENCOUNTER — Ambulatory Visit (INDEPENDENT_AMBULATORY_CARE_PROVIDER_SITE_OTHER): Payer: Medicare Other | Admitting: Interventional Cardiology

## 2013-11-09 VITALS — BP 152/82 | HR 72 | Ht 67.0 in | Wt 209.8 lb

## 2013-11-09 DIAGNOSIS — E785 Hyperlipidemia, unspecified: Secondary | ICD-10-CM

## 2013-11-09 DIAGNOSIS — I10 Essential (primary) hypertension: Secondary | ICD-10-CM | POA: Diagnosis not present

## 2013-11-09 DIAGNOSIS — Z01810 Encounter for preprocedural cardiovascular examination: Secondary | ICD-10-CM

## 2013-11-09 DIAGNOSIS — Z7901 Long term (current) use of anticoagulants: Secondary | ICD-10-CM

## 2013-11-09 DIAGNOSIS — I4891 Unspecified atrial fibrillation: Secondary | ICD-10-CM

## 2013-11-09 NOTE — Progress Notes (Signed)
Patient ID: [name], female   DOB: 1940/12/23, 73 y.o.   MRN: [id number]    [address], [place]  [address] Phone: [phone] Fax:  [phone]  Date:  11/09/2013   ID:  [name], DOB Aug 02, 1940, MRN [id number]  PCP:  Mathews Argyle, MD   ASSESSMENT:  1. Chronic atrial fibrillation with controlled rate 2. Hypertension, controlled 3. Chronic anticoagulation therapy 4. Osteoarthritis with difficulty and left total knee replacement  PLAN:  1. Discontinue Eliquis 72 hours prior to the upcoming knee surgery 2. Clinical followup in 12 months 3. Lower extremity swelling continues to be a problem her diuretic dose should be doubled to Lasix 40 mg per day 4. Cleared for upcoming redo Left knee surgery at [facility] on 11/19/2013   SUBJECTIVE: [name] is a 73 y.o. female who is getting ready to have a redo of her left knee operation performed at [facility] by Dr. Gae Gallop. She denies orthopnea and heart failure symptoms. No palpitations, syncope, or chest pain. She has not had blood in her urine or stool. No transient neurological complaints. She occasionally has no swelling in her hands and there is chronic edema in her lower extremities. She denies orthopnea. There's been no cough, dyspnea, or other heart failure complaints.   Wt Readings from Last 3 Encounters:  11/09/13 209 lb 12.8 oz (95.165 kg)  09/25/12 209 lb 3.5 oz (94.9 kg)  12/19/11 199 lb (90.266 kg)     Past Medical History  Diagnosis Date  . Hypertension   . Hyperlipidemia   . Cancer     breast cancer  . Anal stenosis   . Dysrhythmia     atrial fibrilation  . Arthritis   . Atrial fibrillation   . History of cardioversion     02/04/2012  . Sleep apnea     cpap---DON'T KNOW SETTINGS  . Hypothyroidism     Current Outpatient Prescriptions  Medication Sig Dispense Refill  . acetaminophen (TYLENOL) 500 MG  tablet Take 500 mg by mouth every 6 (six) hours as needed for pain.      Marland Kitchen atorvastatin (LIPITOR) 40 MG tablet Take 40 mg by mouth daily.      . calcium carbonate (TUMS - DOSED IN MG ELEMENTAL CALCIUM) 500 MG chewable tablet Chew 1 tablet by mouth daily.      Marland Kitchen diltiazem (CARDIZEM CD) 180 MG 24 hr capsule TAKE 1 CAPSULE DAILY  90 capsule  1  . docusate sodium (COLACE) 100 MG capsule Take 300 mg by mouth at bedtime.       . enoxaparin (LOVENOX) 30 MG/0.3ML injection Inject 0.3 mLs (30 mg total) into the skin every 12 (twelve) hours.  24 Syringe  0  . furosemide (LASIX) 20 MG tablet Take 20 mg by mouth daily.      . Multiple Vitamin (MULITIVITAMIN WITH MINERALS) TABS Take 1 tablet by mouth daily.      Marland Kitchen oxyCODONE (ROXICODONE) 5 MG immediate release tablet 1-2 tabs po q4-6hrs prn pain  90 tablet  0  . PARoxetine (PAXIL) 40 MG tablet Take 40 mg by mouth every morning.      . polyethylene glycol (MIRALAX / GLYCOLAX) packet Take 17 g by mouth daily as needed (for bowel movement).       . potassium chloride SA (K-DUR,KLOR-CON) 20 MEQ tablet TAKE 1 TABLET DAILY  90 tablet  2  . solifenacin (VESICARE) 10 MG  tablet Take 10 mg by mouth daily as needed. Urinary frequency      . levothyroxine (SYNTHROID, LEVOTHROID) 25 MCG tablet Take 1 tablet (25 mcg total) by mouth daily before breakfast.  30 tablet  0   No current facility-administered medications for this visit.    Allergies:    Allergies  Allergen Reactions  . Benazepril Cough  . Diclofenac Itching    Feels difficult to swallow, but no tongue swelling or SOB. (per pt, this isn't an issue)    Social History:  The patient  reports that she has never smoked. She has never used smokeless tobacco. She reports that she drinks about 8.4 ounces of alcohol per week. She reports that she does not use illicit drugs.   ROS:  Please see the history of present illness.   Left greater than right lower extremity swelling has persisted greater than a year, since  left knee replacement   All other systems reviewed and negative.   OBJECTIVE: VS:  BP 152/82  Pulse 72  Ht 5\' 7"  (1.702 m)  Wt 209 lb 12.8 oz (95.165 kg)  BMI 32.85 kg/m2 Well nourished, well developed, in no acute distress, appears in his stated age 58: normal Neck: JVD flat while sitting at 90. Carotid bruit absent  Cardiac:  normal S1, S2; IIRR; no murmur Lungs:  clear to auscultation bilaterally, no wheezing, rhonchi or rales Abd: soft, nontender, no hepatomegaly Ext: Edema 2+ left trace to 1+ right. Pulses irregularly irregular but 2+ in all station Skin: warm and dry Neuro:  CNs 2-12 intact, no focal abnormalities noted  EKG:  Atrial fibrillation with controlled ventricular response. Otherwise normal       Signed, [place], MD 11/09/2013 10:12 AM

## 2013-11-09 NOTE — Patient Instructions (Signed)
Your physician recommends that you continue on your current medications as directed. Please refer to the Current Medication list given to you today.  Your physician wants you to follow-up in: 1 year with Dr. Tamala Julian. You will receive a reminder letter in the mail two months in advance. If you don't receive a letter, please call our office to schedule the follow-up appointment.   You need to stop your Eliquis 72 hours prior to your knee surgery at [facility]. The Doctor at [facility] will let you know when it is safe for you to go back on medication.

## 2013-11-11 DIAGNOSIS — Z5189 Encounter for other specified aftercare: Secondary | ICD-10-CM | POA: Diagnosis not present

## 2013-11-11 DIAGNOSIS — E039 Hypothyroidism, unspecified: Secondary | ICD-10-CM | POA: Diagnosis not present

## 2013-11-11 DIAGNOSIS — Z96659 Presence of unspecified artificial knee joint: Secondary | ICD-10-CM | POA: Diagnosis not present

## 2013-11-11 DIAGNOSIS — I4891 Unspecified atrial fibrillation: Secondary | ICD-10-CM | POA: Diagnosis not present

## 2013-11-11 DIAGNOSIS — M25569 Pain in unspecified knee: Secondary | ICD-10-CM | POA: Diagnosis not present

## 2013-11-11 DIAGNOSIS — Z01818 Encounter for other preprocedural examination: Secondary | ICD-10-CM | POA: Diagnosis not present

## 2013-11-11 DIAGNOSIS — M7989 Other specified soft tissue disorders: Secondary | ICD-10-CM | POA: Diagnosis not present

## 2013-11-11 DIAGNOSIS — N318 Other neuromuscular dysfunction of bladder: Secondary | ICD-10-CM | POA: Diagnosis not present

## 2013-11-14 ENCOUNTER — Other Ambulatory Visit: Payer: Self-pay | Admitting: Interventional Cardiology

## 2013-11-19 DIAGNOSIS — K5909 Other constipation: Secondary | ICD-10-CM | POA: Diagnosis present

## 2013-11-19 DIAGNOSIS — Z0189 Encounter for other specified special examinations: Secondary | ICD-10-CM | POA: Diagnosis not present

## 2013-11-19 DIAGNOSIS — G4730 Sleep apnea, unspecified: Secondary | ICD-10-CM | POA: Diagnosis not present

## 2013-11-19 DIAGNOSIS — Z853 Personal history of malignant neoplasm of breast: Secondary | ICD-10-CM | POA: Diagnosis not present

## 2013-11-19 DIAGNOSIS — S58119A Complete traumatic amputation at level between elbow and wrist, unspecified arm, initial encounter: Secondary | ICD-10-CM | POA: Diagnosis not present

## 2013-11-19 DIAGNOSIS — I4891 Unspecified atrial fibrillation: Secondary | ICD-10-CM | POA: Diagnosis present

## 2013-11-19 DIAGNOSIS — G4733 Obstructive sleep apnea (adult) (pediatric): Secondary | ICD-10-CM | POA: Diagnosis present

## 2013-11-19 DIAGNOSIS — F3289 Other specified depressive episodes: Secondary | ICD-10-CM | POA: Diagnosis present

## 2013-11-19 DIAGNOSIS — M25569 Pain in unspecified knee: Secondary | ICD-10-CM | POA: Diagnosis not present

## 2013-11-19 DIAGNOSIS — R109 Unspecified abdominal pain: Secondary | ICD-10-CM | POA: Diagnosis not present

## 2013-11-19 DIAGNOSIS — T84498A Other mechanical complication of other internal orthopedic devices, implants and grafts, initial encounter: Secondary | ICD-10-CM | POA: Diagnosis present

## 2013-11-19 DIAGNOSIS — E785 Hyperlipidemia, unspecified: Secondary | ICD-10-CM | POA: Diagnosis not present

## 2013-11-19 DIAGNOSIS — G8918 Other acute postprocedural pain: Secondary | ICD-10-CM | POA: Diagnosis not present

## 2013-11-19 DIAGNOSIS — E039 Hypothyroidism, unspecified: Secondary | ICD-10-CM | POA: Diagnosis present

## 2013-11-19 DIAGNOSIS — M7989 Other specified soft tissue disorders: Secondary | ICD-10-CM | POA: Diagnosis not present

## 2013-11-19 DIAGNOSIS — T84039A Mechanical loosening of unspecified internal prosthetic joint, initial encounter: Secondary | ICD-10-CM | POA: Diagnosis not present

## 2013-11-19 DIAGNOSIS — R279 Unspecified lack of coordination: Secondary | ICD-10-CM | POA: Diagnosis not present

## 2013-11-19 DIAGNOSIS — Z471 Aftercare following joint replacement surgery: Secondary | ICD-10-CM | POA: Diagnosis not present

## 2013-11-19 DIAGNOSIS — N318 Other neuromuscular dysfunction of bladder: Secondary | ICD-10-CM | POA: Diagnosis present

## 2013-11-19 DIAGNOSIS — T84099A Other mechanical complication of unspecified internal joint prosthesis, initial encounter: Secondary | ICD-10-CM | POA: Diagnosis not present

## 2013-11-19 DIAGNOSIS — Z472 Encounter for removal of internal fixation device: Secondary | ICD-10-CM | POA: Diagnosis not present

## 2013-11-19 DIAGNOSIS — M6281 Muscle weakness (generalized): Secondary | ICD-10-CM | POA: Diagnosis not present

## 2013-11-19 DIAGNOSIS — R262 Difficulty in walking, not elsewhere classified: Secondary | ICD-10-CM | POA: Diagnosis not present

## 2013-11-19 DIAGNOSIS — R030 Elevated blood-pressure reading, without diagnosis of hypertension: Secondary | ICD-10-CM | POA: Diagnosis present

## 2013-11-19 DIAGNOSIS — Z96659 Presence of unspecified artificial knee joint: Secondary | ICD-10-CM | POA: Diagnosis not present

## 2013-11-19 DIAGNOSIS — M1710 Unilateral primary osteoarthritis, unspecified knee: Secondary | ICD-10-CM | POA: Diagnosis not present

## 2013-11-19 DIAGNOSIS — F329 Major depressive disorder, single episode, unspecified: Secondary | ICD-10-CM | POA: Diagnosis present

## 2013-11-19 DIAGNOSIS — T8489XA Other specified complication of internal orthopedic prosthetic devices, implants and grafts, initial encounter: Secondary | ICD-10-CM | POA: Diagnosis not present

## 2013-11-19 DIAGNOSIS — Y831 Surgical operation with implant of artificial internal device as the cause of abnormal reaction of the patient, or of later complication, without mention of misadventure at the time of the procedure: Secondary | ICD-10-CM | POA: Diagnosis not present

## 2013-11-19 DIAGNOSIS — E876 Hypokalemia: Secondary | ICD-10-CM | POA: Diagnosis not present

## 2013-11-23 DIAGNOSIS — F329 Major depressive disorder, single episode, unspecified: Secondary | ICD-10-CM | POA: Diagnosis not present

## 2013-11-23 DIAGNOSIS — T84498A Other mechanical complication of other internal orthopedic devices, implants and grafts, initial encounter: Secondary | ICD-10-CM | POA: Diagnosis not present

## 2013-11-23 DIAGNOSIS — F3289 Other specified depressive episodes: Secondary | ICD-10-CM | POA: Diagnosis not present

## 2013-11-23 DIAGNOSIS — R030 Elevated blood-pressure reading, without diagnosis of hypertension: Secondary | ICD-10-CM | POA: Diagnosis not present

## 2013-11-23 DIAGNOSIS — D72829 Elevated white blood cell count, unspecified: Secondary | ICD-10-CM | POA: Diagnosis not present

## 2013-11-23 DIAGNOSIS — Z853 Personal history of malignant neoplasm of breast: Secondary | ICD-10-CM | POA: Diagnosis not present

## 2013-11-23 DIAGNOSIS — M6281 Muscle weakness (generalized): Secondary | ICD-10-CM | POA: Diagnosis not present

## 2013-11-23 DIAGNOSIS — I10 Essential (primary) hypertension: Secondary | ICD-10-CM | POA: Diagnosis not present

## 2013-11-23 DIAGNOSIS — N318 Other neuromuscular dysfunction of bladder: Secondary | ICD-10-CM | POA: Diagnosis not present

## 2013-11-23 DIAGNOSIS — R279 Unspecified lack of coordination: Secondary | ICD-10-CM | POA: Diagnosis not present

## 2013-11-23 DIAGNOSIS — E785 Hyperlipidemia, unspecified: Secondary | ICD-10-CM | POA: Diagnosis not present

## 2013-11-23 DIAGNOSIS — E039 Hypothyroidism, unspecified: Secondary | ICD-10-CM | POA: Diagnosis not present

## 2013-11-23 DIAGNOSIS — Z5189 Encounter for other specified aftercare: Secondary | ICD-10-CM | POA: Diagnosis not present

## 2013-11-23 DIAGNOSIS — IMO0002 Reserved for concepts with insufficient information to code with codable children: Secondary | ICD-10-CM | POA: Diagnosis not present

## 2013-11-23 DIAGNOSIS — B370 Candidal stomatitis: Secondary | ICD-10-CM | POA: Diagnosis not present

## 2013-11-23 DIAGNOSIS — R262 Difficulty in walking, not elsewhere classified: Secondary | ICD-10-CM | POA: Diagnosis not present

## 2013-11-23 DIAGNOSIS — M25569 Pain in unspecified knee: Secondary | ICD-10-CM | POA: Diagnosis not present

## 2013-11-23 DIAGNOSIS — D62 Acute posthemorrhagic anemia: Secondary | ICD-10-CM | POA: Diagnosis not present

## 2013-11-23 DIAGNOSIS — M7989 Other specified soft tissue disorders: Secondary | ICD-10-CM | POA: Diagnosis not present

## 2013-11-23 DIAGNOSIS — R42 Dizziness and giddiness: Secondary | ICD-10-CM | POA: Diagnosis not present

## 2013-11-23 DIAGNOSIS — G4730 Sleep apnea, unspecified: Secondary | ICD-10-CM | POA: Diagnosis not present

## 2013-11-23 DIAGNOSIS — M1710 Unilateral primary osteoarthritis, unspecified knee: Secondary | ICD-10-CM | POA: Diagnosis not present

## 2013-11-23 DIAGNOSIS — Z471 Aftercare following joint replacement surgery: Secondary | ICD-10-CM | POA: Diagnosis not present

## 2013-11-23 DIAGNOSIS — Z96659 Presence of unspecified artificial knee joint: Secondary | ICD-10-CM | POA: Diagnosis not present

## 2013-11-23 DIAGNOSIS — Z472 Encounter for removal of internal fixation device: Secondary | ICD-10-CM | POA: Diagnosis not present

## 2013-11-23 DIAGNOSIS — I4891 Unspecified atrial fibrillation: Secondary | ICD-10-CM | POA: Diagnosis not present

## 2013-11-23 DIAGNOSIS — M25469 Effusion, unspecified knee: Secondary | ICD-10-CM | POA: Diagnosis not present

## 2013-11-23 DIAGNOSIS — E876 Hypokalemia: Secondary | ICD-10-CM | POA: Diagnosis not present

## 2013-11-24 ENCOUNTER — Encounter: Payer: Self-pay | Admitting: Adult Health

## 2013-11-24 ENCOUNTER — Encounter: Payer: Self-pay | Admitting: *Deleted

## 2013-11-24 ENCOUNTER — Non-Acute Institutional Stay (SKILLED_NURSING_FACILITY): Payer: Medicare Other | Admitting: Adult Health

## 2013-11-24 DIAGNOSIS — I4891 Unspecified atrial fibrillation: Secondary | ICD-10-CM

## 2013-11-24 DIAGNOSIS — N3281 Overactive bladder: Secondary | ICD-10-CM

## 2013-11-24 DIAGNOSIS — E876 Hypokalemia: Secondary | ICD-10-CM

## 2013-11-24 DIAGNOSIS — Z96652 Presence of left artificial knee joint: Secondary | ICD-10-CM

## 2013-11-24 DIAGNOSIS — I10 Essential (primary) hypertension: Secondary | ICD-10-CM

## 2013-11-24 DIAGNOSIS — F3289 Other specified depressive episodes: Secondary | ICD-10-CM

## 2013-11-24 DIAGNOSIS — R609 Edema, unspecified: Secondary | ICD-10-CM

## 2013-11-24 DIAGNOSIS — R600 Localized edema: Secondary | ICD-10-CM

## 2013-11-24 DIAGNOSIS — Z96659 Presence of unspecified artificial knee joint: Secondary | ICD-10-CM

## 2013-11-24 DIAGNOSIS — R42 Dizziness and giddiness: Secondary | ICD-10-CM | POA: Diagnosis not present

## 2013-11-24 DIAGNOSIS — F329 Major depressive disorder, single episode, unspecified: Secondary | ICD-10-CM

## 2013-11-24 DIAGNOSIS — N318 Other neuromuscular dysfunction of bladder: Secondary | ICD-10-CM

## 2013-11-24 DIAGNOSIS — F32A Depression, unspecified: Secondary | ICD-10-CM

## 2013-11-24 DIAGNOSIS — I480 Paroxysmal atrial fibrillation: Secondary | ICD-10-CM

## 2013-11-24 DIAGNOSIS — D72829 Elevated white blood cell count, unspecified: Secondary | ICD-10-CM

## 2013-11-24 DIAGNOSIS — E785 Hyperlipidemia, unspecified: Secondary | ICD-10-CM

## 2013-11-24 NOTE — Progress Notes (Signed)
Patient ID: [name], female   DOB: 10/10/1940, 73 y.o.   MRN: [id number]               PROGRESS NOTE  DATE: 11/24/2013  FACILITY: Nursing Home Location: [facility]  LEVEL OF CARE: SNF (31)  Acute Visit  CHIEF COMPLAINT:  Follow-up Hospitalization  HISTORY OF PRESENT ILLNESS: This is a 73 year old female who has been admitted to [facility] on 11/23/13 from [facility] Revision of left total knee arthroplasty. She has been admitted for a short-term rehabilitation.  REASSESSMENT OF ONGOING PROBLEM(S):  ATRIAL FIBRILLATION: the patients atrial fibrillation remains stable.  The patient denies DOE, tachycardia, orthopnea, transient neurological sx, pedal edema, palpitations, & PNDs.  No complications noted from the medications currently being used.  EDEMA: The patient's edema remains stable. Patient denies increasing lower extremity swelling, calf pain, chest pain or shortness of breath. No complications reported from the medications currently being used.  DEPRESSION: The depression remains stable. Patient denies ongoing feelings of sadness, insomnia, anedhonia or lack of appetite. No complications reported from the medications currently being used. Staff do not report behavioral problems.  PAST MEDICAL HISTORY : Reviewed.  No changes/see problem list  CURRENT MEDICATIONS: Reviewed per MAR/see medication list  REVIEW OF SYSTEMS:  GENERAL: no change in appetite, no fatigue, no weight changes, no fever, chills or weakness RESPIRATORY: no cough, SOB, DOE, wheezing, hemoptysis CARDIAC: no chest pain, or palpitations, +edema GI: no abdominal pain, diarrhea, constipation, heart burn, nausea or vomiting  PHYSICAL EXAMINATION  GENERAL: no acute distress, normal body habitus EYES: conjunctivae normal, sclerae normal, normal eye lids NECK: supple, trachea midline, no neck masses, no thyroid tenderness, no thyromegaly LYMPHATICS: no LAN in the neck,  no supraclavicular LAN RESPIRATORY: breathing is even & unlabored, BS CTAB CARDIAC: irregular rate, no murmur,no extra heart sounds, LLE edema 2+ GI: abdomen soft, normal BS, no masses, no tenderness, no hepatomegaly, no splenomegaly EXTREMITIES: able to move all 4 extremities with limited ROM on left knee due to pain PSYCHIATRIC: the patient is alert & oriented to person, affect & behavior appropriate  LABS/RADIOLOGY: 11/23/13 WBC 11.6 hematocrit 0.33 potassium 3.2 BUN 7 creatinine 0.5 Labs reviewed: Basic Metabolic Panel:  Recent Labs  04/29/13 0812  NA 139  K 4.2  CL 105  CO2 27  GLUCOSE 92  BUN 13  CREATININE 0.7  CALCIUM 9.0   CBC:  Recent Labs  04/29/13 0812  WBC 7.7  HGB 12.4  HCT 36.7  MCV 87.3  PLT 299.0    ASSESSMENT/PLAN:  Status post revision of left total knee arthroplasty - for rehabilitation Vertigo - continue scopolamine patch Hyperlipidemia - continue atorvastatin Atrial fibrillation - continue Cardizem and Elequis Hypothyroidism - continue levothyroxine Edema of left lower extremity - continue Lasix Overactive bladder - continue oxybutynin Depression - continue Paxil Hypokalemia - continue supplementation; check BMP Leukocytosis - check cbc   CPT CODE: 19417  Monina Vargas - NP [facility] [phone]

## 2013-11-25 ENCOUNTER — Non-Acute Institutional Stay (SKILLED_NURSING_FACILITY): Payer: Medicare Other | Admitting: Adult Health

## 2013-11-25 DIAGNOSIS — B370 Candidal stomatitis: Secondary | ICD-10-CM

## 2013-11-26 ENCOUNTER — Encounter: Payer: Self-pay | Admitting: Adult Health

## 2013-11-26 DIAGNOSIS — B370 Candidal stomatitis: Secondary | ICD-10-CM | POA: Insufficient documentation

## 2013-11-26 NOTE — Progress Notes (Signed)
Patient ID: [name], female   DOB: [birth year], 73 y.o.   MRN: [id number]               PROGRESS NOTE  DATE: 11/25/13  FACILITY: Nursing Home Location: [facility]  LEVEL OF CARE: SNF (31)  Acute Visit  CHIEF COMPLAINT:  Manage Oral Candida  HISTORY OF PRESENT ILLNESS: This is a 73 year old female who complained of burning sensation in her mouth especially when she is eating. Noted to have whitish cheesy coating on her tongue.    PAST MEDICAL HISTORY : Reviewed.  No changes/see problem list  CURRENT MEDICATIONS: Reviewed per MAR/see medication list  REVIEW OF SYSTEMS:  GENERAL: no change in appetite, no fatigue, no weight changes, no fever, chills or weakness RESPIRATORY: no cough, SOB, DOE, wheezing, hemoptysis CARDIAC: no chest pain, or palpitations, +edema GI: no abdominal pain, diarrhea, constipation, heart burn, nausea or vomiting  PHYSICAL EXAMINATION  GENERAL: no acute distress, normal body habitus MOUTH: see HPI NECK: supple, trachea midline, no neck masses, no thyroid tenderness, no thyromegaly LYMPHATICS: no LAN in the neck, no supraclavicular LAN RESPIRATORY: breathing is even & unlabored, BS CTAB CARDIAC: irregular rate, no murmur,no extra heart sounds, LLE edema 2+ GI: abdomen soft, normal BS, no masses, no tenderness, no hepatomegaly, no splenomegaly EXTREMITIES: able to move all 4 extremities with limited ROM on left knee due to pain PSYCHIATRIC: the patient is alert & oriented to person, affect & behavior appropriate  LABS/RADIOLOGY: 11/23/13 WBC 11.6 hematocrit 0.33 potassium 3.2 BUN 7 creatinine 0.5 Labs reviewed: Basic Metabolic Panel:  Recent Labs  04/29/13 0812  NA 139  K 4.2  CL 105  CO2 27  GLUCOSE 92  BUN 13  CREATININE 0.7  CALCIUM 9.0   CBC:  Recent Labs  04/29/13 0812  WBC 7.7  HGB 12.4  HCT 36.7  MCV 87.3  PLT 299.0    ASSESSMENT/PLAN:  Oral Candida -  Start Magic Mouthwash 10 ml swish and swallow  QID x 2 weeks   CPT CODE: 20947  Albertia Carvin Vargas - NP [facility] [phone]

## 2013-12-01 ENCOUNTER — Non-Acute Institutional Stay (SKILLED_NURSING_FACILITY): Payer: Medicare Other | Admitting: Internal Medicine

## 2013-12-01 DIAGNOSIS — M1710 Unilateral primary osteoarthritis, unspecified knee: Secondary | ICD-10-CM

## 2013-12-01 DIAGNOSIS — I4891 Unspecified atrial fibrillation: Secondary | ICD-10-CM | POA: Diagnosis not present

## 2013-12-01 DIAGNOSIS — M1712 Unilateral primary osteoarthritis, left knee: Secondary | ICD-10-CM

## 2013-12-01 DIAGNOSIS — E039 Hypothyroidism, unspecified: Secondary | ICD-10-CM | POA: Diagnosis not present

## 2013-12-01 DIAGNOSIS — D62 Acute posthemorrhagic anemia: Secondary | ICD-10-CM | POA: Insufficient documentation

## 2013-12-01 DIAGNOSIS — I480 Paroxysmal atrial fibrillation: Secondary | ICD-10-CM

## 2013-12-01 DIAGNOSIS — IMO0002 Reserved for concepts with insufficient information to code with codable children: Secondary | ICD-10-CM

## 2013-12-01 NOTE — Progress Notes (Signed)
HISTORY & PHYSICAL  DATE: 12/01/2013   FACILITY: [facility]  LEVEL OF CARE: SNF (31)  ALLERGIES:  Allergies  Allergen Reactions  . Benazepril Cough  . Diclofenac Itching    Feels difficult to swallow, but no tongue swelling or SOB. (per pt, this isn't an issue)    CHIEF COMPLAINT:  Manage left knee osteoarthritis, hypothyroidism and atrial fibrillation  HISTORY OF PRESENT ILLNESS: Patient is a 73 year old Caucasian female.  KNEE OSTEOARTHRITIS: Patient had a history of pain and functional disability in the knee due to end-stage osteoarthritis and has failed nonsurgical conservative treatments. Patient had worsening of pain with activity and weight bearing, pain that interfered with activities of daily living & pain with passive range of motion. Therefore patient underwent revision of left total knee arthroplasty and removal of left knee prostheses and placement of antibiotic spacer. The patient tolerated the procedure well. Patient is admitted to this facility for sort short-term rehabilitation. Patient denies knee pain.  ATRIAL FIBRILLATION: the patients atrial fibrillation remains stable.  The patient denies DOE, tachycardia, orthopnea, transient neurological sx, palpitations, & PNDs.  No complications noted from the medications currently being used. Complains of lower extremity swelling.  HYPOTHYROIDISM: The hypothyroidism remains stable. No complications noted from the medications presently being used.  The patient denies fatigue or constipation.  Last TSH not available.  PAST MEDICAL HISTORY :  Past Medical History  Diagnosis Date  . Hypertension   . Hyperlipidemia   . Cancer     breast cancer  . Anal stenosis   . Dysrhythmia     atrial fibrilation  . Atrial fibrillation   . History of cardioversion     02/04/2012  . Sleep apnea     cpap---DON'T KNOW SETTINGS  . Arthritis     rheumatoid  . Depression   . Hypothyroidism   . OAB  (overactive bladder)     PAST SURGICAL HISTORY: Past Surgical History  Procedure Laterality Date  . Anal dilation      multiple  . Abdominal hysterectomy    . Incision and drainage perirectal abscess  11/01/2011    Procedure: IRRIGATION AND DEBRIDEMENT PERIRECTAL ABSCESS;  Surgeon: Shann Medal, MD;  Location: [place];  Service: General;  Laterality: N/A;  Rigid Sigmiodoscopy ; Irrigation and Debridement left perineum and labia.  . Breast surgery      right mastectomy; left lumpectomy  . Joint replacement      right knee  . Colon surgery  1986  . Cardioversion  02/04/2012    Procedure: CARDIOVERSION;  Surgeon: Sinclair Grooms, MD;  Location: [place];  Service: Cardiovascular;  Laterality: N/A;  . Total knee arthroplasty Left 10/03/2012    Procedure: LEFT TOTAL KNEE ARTHROPLASTY;  Surgeon: Yvette Rack., MD;  Location: [place];  Service: Orthopedics;  Laterality: Left;    SOCIAL HISTORY:  reports that she has never smoked. She has never used smokeless tobacco. She reports that she drinks about 8.4 ounces of alcohol per week. She reports that she does not use illicit drugs.  FAMILY HISTORY:  Family History  Problem Relation Age of Onset  . Cancer Maternal Aunt     breast  . Cancer Mother     breast  . Heart disease Father     CURRENT MEDICATIONS: Reviewed per MAR/see medication list  REVIEW OF SYSTEMS:  See HPI otherwise 14 point ROS is negative.  PHYSICAL EXAMINATION  VS:  See  VS section  GENERAL: no acute distress, moderately obese body habitus EYES: conjunctivae normal, sclerae normal, normal eye lids MOUTH/THROAT: lips without lesions,no lesions in the mouth,tongue is without lesions,uvula elevates in midline NECK: supple, trachea midline, no neck masses, no thyroid tenderness, no thyromegaly LYMPHATICS: no LAN in the neck, no supraclavicular LAN RESPIRATORY: breathing is even & unlabored, BS CTAB CARDIAC: Heart rate is irregularly irregular, no murmur,no extra heart  sounds, +3 left lower extremity edema GI:  ABDOMEN: abdomen soft, normal BS, no masses, no tenderness  LIVER/SPLEEN: no hepatomegaly, no splenomegaly MUSCULOSKELETAL: HEAD: normal to inspection & palpation BACK: no kyphosis, scoliosis or spinal processes tenderness EXTREMITIES: LEFT UPPER EXTREMITY: full range of motion, normal strength & tone RIGHT UPPER EXTREMITY:  full range of motion, normal strength & tone LEFT LOWER EXTREMITY:   range of motion not tested due to surgery, normal strength & tone RIGHT LOWER EXTREMITY:  Moderate range of motion, normal strength & tone PSYCHIATRIC: the patient is alert & oriented to person, affect & behavior appropriate  LABS/RADIOLOGY:  11-25-13 hemoglobin 10.8, MCV 90.8, platelets 455, WBC 8.8, BMP normal  Labs reviewed: Basic Metabolic Panel:  Recent Labs  04/29/13 0812  NA 139  K 4.2  CL 105  CO2 27  GLUCOSE 92  BUN 13  CREATININE 0.7  CALCIUM 9.0   CBC:  Recent Labs  04/29/13 0812  WBC 7.7  HGB 12.4  HCT 36.7  MCV 87.3  PLT 299.0    ASSESSMENT/PLAN:  Left knee osteoarthritis-status post revision arthroplasty. Continue rehabilitation. Hypothyroidism-continue levothyroxine Atrial fibrillation-rate controlled Acute blood loss anemia -- stable History of breast cancer-no acute issues Rheumatoid arthritis-denies joint pain  I have reviewed patient's medical records received at admission/from hospitalization.  CPT CODE: 09381  Peggy Loge Y Tatjana Turcott, [place] [phone]

## 2013-12-02 DIAGNOSIS — Z471 Aftercare following joint replacement surgery: Secondary | ICD-10-CM | POA: Diagnosis not present

## 2013-12-02 DIAGNOSIS — Z5189 Encounter for other specified aftercare: Secondary | ICD-10-CM | POA: Diagnosis not present

## 2013-12-02 DIAGNOSIS — M1710 Unilateral primary osteoarthritis, unspecified knee: Secondary | ICD-10-CM | POA: Diagnosis not present

## 2013-12-02 DIAGNOSIS — Z96659 Presence of unspecified artificial knee joint: Secondary | ICD-10-CM | POA: Diagnosis not present

## 2013-12-30 DIAGNOSIS — Z96659 Presence of unspecified artificial knee joint: Secondary | ICD-10-CM | POA: Diagnosis not present

## 2013-12-30 DIAGNOSIS — M25469 Effusion, unspecified knee: Secondary | ICD-10-CM | POA: Diagnosis not present

## 2013-12-31 ENCOUNTER — Non-Acute Institutional Stay (SKILLED_NURSING_FACILITY): Payer: Medicare Other | Admitting: Internal Medicine

## 2013-12-31 DIAGNOSIS — E039 Hypothyroidism, unspecified: Secondary | ICD-10-CM

## 2013-12-31 DIAGNOSIS — I480 Paroxysmal atrial fibrillation: Secondary | ICD-10-CM

## 2013-12-31 DIAGNOSIS — I4891 Unspecified atrial fibrillation: Secondary | ICD-10-CM | POA: Diagnosis not present

## 2013-12-31 DIAGNOSIS — D62 Acute posthemorrhagic anemia: Secondary | ICD-10-CM | POA: Diagnosis not present

## 2013-12-31 DIAGNOSIS — M1710 Unilateral primary osteoarthritis, unspecified knee: Secondary | ICD-10-CM | POA: Diagnosis not present

## 2013-12-31 DIAGNOSIS — IMO0002 Reserved for concepts with insufficient information to code with codable children: Secondary | ICD-10-CM

## 2013-12-31 DIAGNOSIS — M1712 Unilateral primary osteoarthritis, left knee: Secondary | ICD-10-CM

## 2014-01-01 NOTE — Progress Notes (Signed)
         PROGRESS NOTE  DATE: 12/31/2013  FACILITY: Nursing Home Location: [facility]  LEVEL OF CARE: SNF (31)  Routine Visit  CHIEF COMPLAINT:  Manage right knee osteoarthritis, atrial fibrillation and hypothyroidism  HISTORY OF PRESENT ILLNESS:  REASSESSMENT OF ONGOING PROBLEM(S):  KNEE OSTEOARTHRITIS: Patient had a history of pain and functional disability in the knee due to end-stage osteoarthritis and has failed nonsurgical conservative treatments. Patient had worsening of pain with activity and weight bearing, pain that interfered with activities of daily living & pain with passive range of motion. Therefore patient underwent total knee arthroplasty and tolerated the procedure well. Patient is admitted to this facility for sort short-term rehabilitation. Patient denies knee pain. Patient states rehabilitation is progressing well.  ATRIAL FIBRILLATION: the patients atrial fibrillation remains stable.  The patient denies DOE, tachycardia, orthopnea, transient neurological sx, palpitations, & PNDs.  No complications noted from the medications currently being used.  HYPOTHYROIDISM: The hypothyroidism remains stable. No complications noted from the medications presently being used.  The patient denies fatigue or constipation.  Last TSH not available.  PAST MEDICAL HISTORY : Reviewed.  No changes/see problem list  CURRENT MEDICATIONS: Reviewed per MAR/see medication list  REVIEW OF SYSTEMS:  GENERAL: no change in appetite, no fatigue, no weight changes, no fever, chills or weakness RESPIRATORY: no cough, SOB, DOE, wheezing, hemoptysis CARDIAC: no chest pain, or palpitations, complains of lower extremity swelling GI: no abdominal pain, diarrhea, constipation, heart burn, nausea or vomiting  PHYSICAL EXAMINATION  VS:  See VS section  GENERAL: no acute distress, moderately obese body habitus EYES: conjunctivae normal, sclerae normal, normal eye lids NECK:  supple, trachea midline, no neck masses, no thyroid tenderness, no thyromegaly LYMPHATICS: no LAN in the neck, no supraclavicular LAN RESPIRATORY: breathing is even & unlabored, BS CTAB CARDIAC: Heart rate is irregularly irregular, no murmur,no extra heart sounds, +2 bilateral lower extremity edema GI: abdomen soft, normal BS, no masses, no tenderness, no hepatomegaly, no splenomegaly PSYCHIATRIC: the patient is alert & oriented to person, affect & behavior appropriate  LABS/RADIOLOGY:  4-15 hemoglobin 10.8, MCV 90.8, platelets 455, WBC 8.8 otherwise BMP normal  ASSESSMENT/PLAN:  Right knee osteoarthritis-status post arthroplasty. Continue rehabilitation. Atrial fibrillation-rate controlled Hypothyroidism-check TSH Acute blood loss anemia-stable Hyperlipidemia-check fasting lipid panel Hypokalemia-continue supplementation Thrombocytosis-acute phase reactant. Monitor. Check liver profile  CPT CODE: 00762  Devynn Scheff Y Gwendoline Judy, MD [facility] [phone]

## 2014-01-04 ENCOUNTER — Encounter: Payer: Self-pay | Admitting: *Deleted

## 2014-01-13 ENCOUNTER — Encounter: Payer: Self-pay | Admitting: Adult Health

## 2014-01-13 ENCOUNTER — Non-Acute Institutional Stay (SKILLED_NURSING_FACILITY): Payer: Medicare Other | Admitting: Adult Health

## 2014-01-13 DIAGNOSIS — IMO0002 Reserved for concepts with insufficient information to code with codable children: Secondary | ICD-10-CM | POA: Diagnosis not present

## 2014-01-13 DIAGNOSIS — M1710 Unilateral primary osteoarthritis, unspecified knee: Secondary | ICD-10-CM | POA: Diagnosis not present

## 2014-01-13 DIAGNOSIS — I4891 Unspecified atrial fibrillation: Secondary | ICD-10-CM

## 2014-01-13 DIAGNOSIS — Z96652 Presence of left artificial knee joint: Secondary | ICD-10-CM

## 2014-01-13 DIAGNOSIS — N318 Other neuromuscular dysfunction of bladder: Secondary | ICD-10-CM

## 2014-01-13 DIAGNOSIS — F3289 Other specified depressive episodes: Secondary | ICD-10-CM

## 2014-01-13 DIAGNOSIS — I10 Essential (primary) hypertension: Secondary | ICD-10-CM

## 2014-01-13 DIAGNOSIS — E039 Hypothyroidism, unspecified: Secondary | ICD-10-CM

## 2014-01-13 DIAGNOSIS — Z96659 Presence of unspecified artificial knee joint: Secondary | ICD-10-CM | POA: Diagnosis not present

## 2014-01-13 DIAGNOSIS — E876 Hypokalemia: Secondary | ICD-10-CM

## 2014-01-13 DIAGNOSIS — R609 Edema, unspecified: Secondary | ICD-10-CM

## 2014-01-13 DIAGNOSIS — N3281 Overactive bladder: Secondary | ICD-10-CM

## 2014-01-13 DIAGNOSIS — E785 Hyperlipidemia, unspecified: Secondary | ICD-10-CM

## 2014-01-13 DIAGNOSIS — I480 Paroxysmal atrial fibrillation: Secondary | ICD-10-CM

## 2014-01-13 DIAGNOSIS — F32A Depression, unspecified: Secondary | ICD-10-CM

## 2014-01-13 DIAGNOSIS — F329 Major depressive disorder, single episode, unspecified: Secondary | ICD-10-CM

## 2014-01-13 DIAGNOSIS — R600 Localized edema: Secondary | ICD-10-CM

## 2014-01-13 DIAGNOSIS — D62 Acute posthemorrhagic anemia: Secondary | ICD-10-CM

## 2014-01-13 NOTE — Progress Notes (Signed)
Patient ID: [name], female   DOB: 12/15/1940, 73 y.o.   MRN: [id number]               PROGRESS NOTE  DATE: 01/13/14  FACILITY: Nursing Home Location: [facility]  LEVEL OF CARE: SNF (31)  Acute Visit  CHIEF COMPLAINT:  Discharge Notes  HISTORY OF PRESENT ILLNESS: This is a 73 year old female who is for discharge home with outpatient rehabilitation. She has been admitted to [facility] on 11/23/13 from [facility] S/P Revision of left total knee arthroplasty. Patient was admitted to this facility for short-term rehabilitation after the patient's recent hospitalization.  Patient has completed SNF rehabilitation and therapy has cleared the patient for discharge.   REASSESSMENT OF ONGOING PROBLEM(S):  HTN: Pt 's HTN remains stable.  Denies CP, sob, DOE, pedal edema, headaches, dizziness or visual disturbances.  No complications from the medications currently being used.  Last BP : 132/70  HYPOKALEMIA: The patient's hypokalemia remains stable. Patient denies muscle cramping or palpitations. No complications reported from current potassium supplementation. 4/15 K 3.7  ATRIAL FIBRILLATION: the patients atrial fibrillation remains stable.  The patient denies DOE, tachycardia, orthopnea, transient neurological sx, pedal edema, palpitations, & PNDs.  No complications noted from the medications currently being used.   PAST MEDICAL HISTORY : Reviewed.  No changes/see problem list  CURRENT MEDICATIONS: Reviewed per MAR/see medication list  REVIEW OF SYSTEMS:  GENERAL: no change in appetite, no fatigue, no weight changes, no fever, chills or weakness RESPIRATORY: no cough, SOB, DOE, wheezing, hemoptysis CARDIAC: no chest pain, or palpitations, +edema GI: no abdominal pain, diarrhea, constipation, heart burn, nausea or vomiting  PHYSICAL EXAMINATION  GENERAL: no acute distress, normal body habitus NECK: supple, trachea midline, no neck masses, no thyroid  tenderness, no thyromegaly RESPIRATORY: breathing is even & unlabored, BS CTAB CARDIAC: irregular rate, no murmur,no extra heart sounds, LLE edema 2+ GI: abdomen soft, normal BS, no masses, no tenderness, no hepatomegaly, no splenomegaly EXTREMITIES: able to move all 4 extremities PSYCHIATRIC: the patient is alert & oriented to person, affect & behavior appropriate  LABS/RADIOLOGY: 11/25/13  WBC 8.8 hemoglobin 10.8 hematocrit 34.6 sodium 139 potassium 3.7 glucose 90 BUN 11 creatinine 0.5 calcium 8.5 11/23/13 WBC 11.6 hematocrit 0.33 potassium 3.2 BUN 7 creatinine 0.5 Labs reviewed: Basic Metabolic Panel:  Recent Labs  04/29/13 0812  NA 139  K 4.2  CL 105  CO2 27  GLUCOSE 92  BUN 13  CREATININE 0.7  CALCIUM 9.0   CBC:  Recent Labs  04/29/13 0812  WBC 7.7  HGB 12.4  HCT 36.7  MCV 87.3  PLT 299.0    ASSESSMENT/PLAN:  Status post revision of left total knee arthroplasty - for outpatient rehabilitation Hyperlipidemia - continue atorvastatin Atrial fibrillation - rate-controlled; continue Cardizem and Elequis Hypothyroidism - continue levothyroxine Edema of left lower extremity - continue Lasix Overactive bladder - continue oxybutynin Depression -stable; continue Paxil Hypokalemia - continue supplementation Hypertension - well-controlled; continue Cardizem Anemia, acute blood loss - stable   I have filled out patient's discharge paperwork and written prescriptions.  Patient will have outpatient rehabilitation.   Total discharge time: Less than 30 minutes Discharge time involved coordination of the discharge process with Education officer, museum, nursing staff and therapy department.    CPT CODE: 23762   Seth Bake - NP [facility] [phone]

## 2014-01-15 DIAGNOSIS — Z79899 Other long term (current) drug therapy: Secondary | ICD-10-CM | POA: Diagnosis not present

## 2014-01-15 DIAGNOSIS — I10 Essential (primary) hypertension: Secondary | ICD-10-CM | POA: Diagnosis not present

## 2014-01-21 DIAGNOSIS — M25569 Pain in unspecified knee: Secondary | ICD-10-CM | POA: Diagnosis not present

## 2014-01-21 DIAGNOSIS — M1710 Unilateral primary osteoarthritis, unspecified knee: Secondary | ICD-10-CM | POA: Diagnosis not present

## 2014-01-21 DIAGNOSIS — R269 Unspecified abnormalities of gait and mobility: Secondary | ICD-10-CM | POA: Diagnosis not present

## 2014-01-21 DIAGNOSIS — Z96659 Presence of unspecified artificial knee joint: Secondary | ICD-10-CM | POA: Diagnosis not present

## 2014-01-25 DIAGNOSIS — M25569 Pain in unspecified knee: Secondary | ICD-10-CM | POA: Diagnosis not present

## 2014-01-25 DIAGNOSIS — Z96659 Presence of unspecified artificial knee joint: Secondary | ICD-10-CM | POA: Diagnosis not present

## 2014-01-25 DIAGNOSIS — R269 Unspecified abnormalities of gait and mobility: Secondary | ICD-10-CM | POA: Diagnosis not present

## 2014-01-25 DIAGNOSIS — M1710 Unilateral primary osteoarthritis, unspecified knee: Secondary | ICD-10-CM | POA: Diagnosis not present

## 2014-01-27 DIAGNOSIS — R269 Unspecified abnormalities of gait and mobility: Secondary | ICD-10-CM | POA: Diagnosis not present

## 2014-01-27 DIAGNOSIS — Z96659 Presence of unspecified artificial knee joint: Secondary | ICD-10-CM | POA: Diagnosis not present

## 2014-01-27 DIAGNOSIS — M25569 Pain in unspecified knee: Secondary | ICD-10-CM | POA: Diagnosis not present

## 2014-01-27 DIAGNOSIS — M1710 Unilateral primary osteoarthritis, unspecified knee: Secondary | ICD-10-CM | POA: Diagnosis not present

## 2014-02-01 DIAGNOSIS — Z96659 Presence of unspecified artificial knee joint: Secondary | ICD-10-CM | POA: Diagnosis not present

## 2014-02-01 DIAGNOSIS — R269 Unspecified abnormalities of gait and mobility: Secondary | ICD-10-CM | POA: Diagnosis not present

## 2014-02-01 DIAGNOSIS — M25569 Pain in unspecified knee: Secondary | ICD-10-CM | POA: Diagnosis not present

## 2014-02-01 DIAGNOSIS — M1710 Unilateral primary osteoarthritis, unspecified knee: Secondary | ICD-10-CM | POA: Diagnosis not present

## 2014-02-03 DIAGNOSIS — M1710 Unilateral primary osteoarthritis, unspecified knee: Secondary | ICD-10-CM | POA: Diagnosis not present

## 2014-02-03 DIAGNOSIS — Z96659 Presence of unspecified artificial knee joint: Secondary | ICD-10-CM | POA: Diagnosis not present

## 2014-02-03 DIAGNOSIS — R269 Unspecified abnormalities of gait and mobility: Secondary | ICD-10-CM | POA: Diagnosis not present

## 2014-02-03 DIAGNOSIS — M25569 Pain in unspecified knee: Secondary | ICD-10-CM | POA: Diagnosis not present

## 2014-02-05 DIAGNOSIS — M1710 Unilateral primary osteoarthritis, unspecified knee: Secondary | ICD-10-CM | POA: Diagnosis not present

## 2014-02-05 DIAGNOSIS — M25569 Pain in unspecified knee: Secondary | ICD-10-CM | POA: Diagnosis not present

## 2014-02-05 DIAGNOSIS — R269 Unspecified abnormalities of gait and mobility: Secondary | ICD-10-CM | POA: Diagnosis not present

## 2014-02-05 DIAGNOSIS — Z96659 Presence of unspecified artificial knee joint: Secondary | ICD-10-CM | POA: Diagnosis not present

## 2014-02-08 DIAGNOSIS — R269 Unspecified abnormalities of gait and mobility: Secondary | ICD-10-CM | POA: Diagnosis not present

## 2014-02-08 DIAGNOSIS — Z96659 Presence of unspecified artificial knee joint: Secondary | ICD-10-CM | POA: Diagnosis not present

## 2014-02-08 DIAGNOSIS — M1710 Unilateral primary osteoarthritis, unspecified knee: Secondary | ICD-10-CM | POA: Diagnosis not present

## 2014-02-08 DIAGNOSIS — M25569 Pain in unspecified knee: Secondary | ICD-10-CM | POA: Diagnosis not present

## 2014-02-11 DIAGNOSIS — M25569 Pain in unspecified knee: Secondary | ICD-10-CM | POA: Diagnosis not present

## 2014-02-11 DIAGNOSIS — Z96659 Presence of unspecified artificial knee joint: Secondary | ICD-10-CM | POA: Diagnosis not present

## 2014-02-17 DIAGNOSIS — M169 Osteoarthritis of hip, unspecified: Secondary | ICD-10-CM | POA: Diagnosis not present

## 2014-02-17 DIAGNOSIS — Z5189 Encounter for other specified aftercare: Secondary | ICD-10-CM | POA: Diagnosis not present

## 2014-02-17 DIAGNOSIS — G8918 Other acute postprocedural pain: Secondary | ICD-10-CM | POA: Diagnosis not present

## 2014-02-17 DIAGNOSIS — M1610 Unilateral primary osteoarthritis, unspecified hip: Secondary | ICD-10-CM | POA: Diagnosis not present

## 2014-02-17 DIAGNOSIS — Z96659 Presence of unspecified artificial knee joint: Secondary | ICD-10-CM | POA: Diagnosis not present

## 2014-02-17 DIAGNOSIS — M25559 Pain in unspecified hip: Secondary | ICD-10-CM | POA: Diagnosis not present

## 2014-02-17 DIAGNOSIS — T8489XA Other specified complication of internal orthopedic prosthetic devices, implants and grafts, initial encounter: Secondary | ICD-10-CM | POA: Diagnosis not present

## 2014-02-22 ENCOUNTER — Telehealth: Payer: Self-pay

## 2014-02-22 NOTE — Telephone Encounter (Signed)
Cardiac clearance request received form [place]. Patient scheduled to have left total hip arthroplasty scheduled for 02/25/14 routed to [place] for approval

## 2014-02-22 NOTE — Telephone Encounter (Signed)
Cardiac clearance given to medical records to fax to [facility]: [place] fax # [phone]

## 2014-02-22 NOTE — Telephone Encounter (Signed)
Cleared for the upcoming surgery. Will need to stop Apixaban 48-72 hours before planned surgery.

## 2014-02-23 DIAGNOSIS — Z01818 Encounter for other preprocedural examination: Secondary | ICD-10-CM | POA: Diagnosis not present

## 2014-02-23 DIAGNOSIS — I4891 Unspecified atrial fibrillation: Secondary | ICD-10-CM | POA: Diagnosis not present

## 2014-02-23 DIAGNOSIS — E876 Hypokalemia: Secondary | ICD-10-CM | POA: Diagnosis not present

## 2014-02-23 DIAGNOSIS — D649 Anemia, unspecified: Secondary | ICD-10-CM | POA: Diagnosis not present

## 2014-02-25 DIAGNOSIS — M25559 Pain in unspecified hip: Secondary | ICD-10-CM | POA: Diagnosis not present

## 2014-02-25 DIAGNOSIS — F329 Major depressive disorder, single episode, unspecified: Secondary | ICD-10-CM | POA: Diagnosis present

## 2014-02-25 DIAGNOSIS — Z96659 Presence of unspecified artificial knee joint: Secondary | ICD-10-CM | POA: Diagnosis not present

## 2014-02-25 DIAGNOSIS — M169 Osteoarthritis of hip, unspecified: Secondary | ICD-10-CM | POA: Diagnosis present

## 2014-02-25 DIAGNOSIS — Z471 Aftercare following joint replacement surgery: Secondary | ICD-10-CM | POA: Diagnosis not present

## 2014-02-25 DIAGNOSIS — R269 Unspecified abnormalities of gait and mobility: Secondary | ICD-10-CM | POA: Diagnosis not present

## 2014-02-25 DIAGNOSIS — F3289 Other specified depressive episodes: Secondary | ICD-10-CM | POA: Diagnosis present

## 2014-02-25 DIAGNOSIS — I4891 Unspecified atrial fibrillation: Secondary | ICD-10-CM | POA: Diagnosis present

## 2014-02-25 DIAGNOSIS — Z96649 Presence of unspecified artificial hip joint: Secondary | ICD-10-CM | POA: Diagnosis not present

## 2014-02-25 DIAGNOSIS — R262 Difficulty in walking, not elsewhere classified: Secondary | ICD-10-CM | POA: Diagnosis not present

## 2014-02-25 DIAGNOSIS — N318 Other neuromuscular dysfunction of bladder: Secondary | ICD-10-CM | POA: Diagnosis present

## 2014-02-25 DIAGNOSIS — S72109A Unspecified trochanteric fracture of unspecified femur, initial encounter for closed fracture: Secondary | ICD-10-CM | POA: Diagnosis not present

## 2014-02-25 DIAGNOSIS — R279 Unspecified lack of coordination: Secondary | ICD-10-CM | POA: Diagnosis not present

## 2014-02-25 DIAGNOSIS — E039 Hypothyroidism, unspecified: Secondary | ICD-10-CM | POA: Diagnosis present

## 2014-02-25 DIAGNOSIS — E876 Hypokalemia: Secondary | ICD-10-CM | POA: Diagnosis present

## 2014-02-25 DIAGNOSIS — Z853 Personal history of malignant neoplasm of breast: Secondary | ICD-10-CM | POA: Diagnosis not present

## 2014-02-25 DIAGNOSIS — M1610 Unilateral primary osteoarthritis, unspecified hip: Secondary | ICD-10-CM | POA: Diagnosis not present

## 2014-02-25 DIAGNOSIS — D649 Anemia, unspecified: Secondary | ICD-10-CM | POA: Diagnosis not present

## 2014-02-25 DIAGNOSIS — E785 Hyperlipidemia, unspecified: Secondary | ICD-10-CM | POA: Diagnosis not present

## 2014-02-25 DIAGNOSIS — Z9010 Acquired absence of unspecified breast and nipple: Secondary | ICD-10-CM | POA: Diagnosis not present

## 2014-02-25 DIAGNOSIS — M6281 Muscle weakness (generalized): Secondary | ICD-10-CM | POA: Diagnosis not present

## 2014-02-25 DIAGNOSIS — Z0189 Encounter for other specified special examinations: Secondary | ICD-10-CM | POA: Diagnosis not present

## 2014-02-25 DIAGNOSIS — M069 Rheumatoid arthritis, unspecified: Secondary | ICD-10-CM | POA: Diagnosis present

## 2014-03-01 ENCOUNTER — Encounter: Payer: Self-pay | Admitting: *Deleted

## 2014-03-01 DIAGNOSIS — Z5189 Encounter for other specified aftercare: Secondary | ICD-10-CM | POA: Diagnosis not present

## 2014-03-01 DIAGNOSIS — R269 Unspecified abnormalities of gait and mobility: Secondary | ICD-10-CM | POA: Diagnosis not present

## 2014-03-01 DIAGNOSIS — M169 Osteoarthritis of hip, unspecified: Secondary | ICD-10-CM | POA: Diagnosis not present

## 2014-03-01 DIAGNOSIS — M25559 Pain in unspecified hip: Secondary | ICD-10-CM | POA: Diagnosis not present

## 2014-03-01 DIAGNOSIS — F3289 Other specified depressive episodes: Secondary | ICD-10-CM | POA: Diagnosis not present

## 2014-03-01 DIAGNOSIS — M1610 Unilateral primary osteoarthritis, unspecified hip: Secondary | ICD-10-CM | POA: Diagnosis not present

## 2014-03-01 DIAGNOSIS — M069 Rheumatoid arthritis, unspecified: Secondary | ICD-10-CM | POA: Diagnosis not present

## 2014-03-01 DIAGNOSIS — M5137 Other intervertebral disc degeneration, lumbosacral region: Secondary | ICD-10-CM | POA: Diagnosis not present

## 2014-03-01 DIAGNOSIS — I4891 Unspecified atrial fibrillation: Secondary | ICD-10-CM | POA: Diagnosis not present

## 2014-03-01 DIAGNOSIS — R21 Rash and other nonspecific skin eruption: Secondary | ICD-10-CM | POA: Diagnosis not present

## 2014-03-01 DIAGNOSIS — T8489XA Other specified complication of internal orthopedic prosthetic devices, implants and grafts, initial encounter: Secondary | ICD-10-CM | POA: Diagnosis not present

## 2014-03-01 DIAGNOSIS — B370 Candidal stomatitis: Secondary | ICD-10-CM | POA: Diagnosis not present

## 2014-03-01 DIAGNOSIS — E039 Hypothyroidism, unspecified: Secondary | ICD-10-CM | POA: Diagnosis not present

## 2014-03-01 DIAGNOSIS — N318 Other neuromuscular dysfunction of bladder: Secondary | ICD-10-CM | POA: Diagnosis not present

## 2014-03-01 DIAGNOSIS — Z471 Aftercare following joint replacement surgery: Secondary | ICD-10-CM | POA: Diagnosis not present

## 2014-03-01 DIAGNOSIS — Z96649 Presence of unspecified artificial hip joint: Secondary | ICD-10-CM | POA: Diagnosis not present

## 2014-03-01 DIAGNOSIS — R279 Unspecified lack of coordination: Secondary | ICD-10-CM | POA: Diagnosis not present

## 2014-03-01 DIAGNOSIS — M6281 Muscle weakness (generalized): Secondary | ICD-10-CM | POA: Diagnosis not present

## 2014-03-01 DIAGNOSIS — I10 Essential (primary) hypertension: Secondary | ICD-10-CM | POA: Diagnosis not present

## 2014-03-01 DIAGNOSIS — I2510 Atherosclerotic heart disease of native coronary artery without angina pectoris: Secondary | ICD-10-CM | POA: Diagnosis not present

## 2014-03-01 DIAGNOSIS — F329 Major depressive disorder, single episode, unspecified: Secondary | ICD-10-CM | POA: Diagnosis not present

## 2014-03-01 DIAGNOSIS — G8918 Other acute postprocedural pain: Secondary | ICD-10-CM | POA: Diagnosis not present

## 2014-03-01 DIAGNOSIS — R262 Difficulty in walking, not elsewhere classified: Secondary | ICD-10-CM | POA: Diagnosis not present

## 2014-03-01 DIAGNOSIS — E785 Hyperlipidemia, unspecified: Secondary | ICD-10-CM | POA: Diagnosis not present

## 2014-03-02 ENCOUNTER — Non-Acute Institutional Stay (SKILLED_NURSING_FACILITY): Payer: Medicare Other | Admitting: Internal Medicine

## 2014-03-02 DIAGNOSIS — E039 Hypothyroidism, unspecified: Secondary | ICD-10-CM

## 2014-03-02 DIAGNOSIS — I4891 Unspecified atrial fibrillation: Secondary | ICD-10-CM

## 2014-03-02 DIAGNOSIS — M1610 Unilateral primary osteoarthritis, unspecified hip: Secondary | ICD-10-CM | POA: Diagnosis not present

## 2014-03-02 DIAGNOSIS — I2510 Atherosclerotic heart disease of native coronary artery without angina pectoris: Secondary | ICD-10-CM | POA: Diagnosis not present

## 2014-03-02 DIAGNOSIS — M1612 Unilateral primary osteoarthritis, left hip: Secondary | ICD-10-CM

## 2014-03-02 DIAGNOSIS — I480 Paroxysmal atrial fibrillation: Secondary | ICD-10-CM

## 2014-03-04 DIAGNOSIS — I2510 Atherosclerotic heart disease of native coronary artery without angina pectoris: Secondary | ICD-10-CM | POA: Insufficient documentation

## 2014-03-04 NOTE — Progress Notes (Signed)
HISTORY & PHYSICAL  DATE: 03/02/2014   FACILITY: [facility]  LEVEL OF CARE: SNF (31)  ALLERGIES:  Allergies  Allergen Reactions  . Benazepril Swelling and Cough    Swelling of tongue  . Diclofenac Itching    Feels difficult to swallow, but no tongue swelling or SOB. (per pt, this isn't an issue)    CHIEF COMPLAINT:  Manage left hip osteoarthritis, CAD, atrial fibrillation  HISTORY OF PRESENT ILLNESS: Patient is a 73 year old Caucasian female.  HIP OSTEOARTHRITIS: patient had advanced end stage OA of the hip with progressively worsening pain & dysfunction.  Pt failed non-surgical conservative management.  Therefore pt underwent total hip arthroplasty & tolerated the procedure well.  Pt denies hip pain currently.  Pt was admitted to this facility for short term rehabilitation. Complains of left lower extremity swelling.  ATRIAL FIBRILLATION: the patients atrial fibrillation remains stable.  The patient denies DOE, tachycardia, orthopnea, transient neurological sx, palpitations, & PNDs.  No complications noted from the medications currently being used.  CAD: The angina has been stable. The patient denies dyspnea on exertion, orthopnea, palpitations and paroxysmal nocturnal dyspnea. No complications noted from the medication presently being used.  PAST MEDICAL HISTORY :  Past Medical History  Diagnosis Date  . Hypertension   . Hyperlipidemia   . Cancer     breast cancer  . Anal stenosis   . Dysrhythmia     atrial fibrilation  . Atrial fibrillation   . History of cardioversion     02/04/2012  . Sleep apnea     cpap---DON'T KNOW SETTINGS  . Arthritis     rheumatoid  . Depression   . Hypothyroidism   . OAB (overactive bladder)     PAST SURGICAL HISTORY: Past Surgical History  Procedure Laterality Date  . Anal dilation      multiple  . Abdominal hysterectomy    . Incision and drainage perirectal abscess  11/01/2011    Procedure: IRRIGATION  AND DEBRIDEMENT PERIRECTAL ABSCESS;  Surgeon: Shann Medal, MD;  Location: [place];  Service: General;  Laterality: N/A;  Rigid Sigmiodoscopy ; Irrigation and Debridement left perineum and labia.  . Breast surgery      right mastectomy; left lumpectomy  . Joint replacement      right knee  . Colon surgery  1986  . Cardioversion  02/04/2012    Procedure: CARDIOVERSION;  Surgeon: Sinclair Grooms, MD;  Location: [place];  Service: Cardiovascular;  Laterality: N/A;  . Total knee arthroplasty Left 10/03/2012    Procedure: LEFT TOTAL KNEE ARTHROPLASTY;  Surgeon: Yvette Rack., MD;  Location: [place];  Service: Orthopedics;  Laterality: Left;    SOCIAL HISTORY:  reports that she has never smoked. She has never used smokeless tobacco. She reports that she drinks about 8.4 ounces of alcohol per week. She reports that she does not use illicit drugs.  FAMILY HISTORY:  Family History  Problem Relation Age of Onset  . Cancer Maternal Aunt     breast  . Cancer Mother     breast  . Heart disease Father     CURRENT MEDICATIONS: Reviewed per MAR/see medication list  REVIEW OF SYSTEMS:  See HPI otherwise 14 point ROS is negative.  PHYSICAL EXAMINATION  VS:  See VS section  GENERAL: no acute distress, normal body habitus EYES: conjunctivae normal, sclerae normal, normal eye lids MOUTH/THROAT: lips without lesions,no lesions in the mouth,tongue is without  lesions,uvula elevates in midline NECK: supple, trachea midline, no neck masses, no thyroid tenderness, no thyromegaly LYMPHATICS: no LAN in the neck, no supraclavicular LAN RESPIRATORY: breathing is even & unlabored, BS CTAB CARDIAC: Heart rate is irregularly irregular, no murmur,no extra heart sounds, +2 left lower extremity edema GI:  ABDOMEN: abdomen soft, normal BS, no masses, no tenderness  LIVER/SPLEEN: no hepatomegaly, no splenomegaly MUSCULOSKELETAL: HEAD: normal to inspection  EXTREMITIES: LEFT UPPER EXTREMITY: full range of motion,  normal strength & tone RIGHT UPPER EXTREMITY:  full range of motion, normal strength & tone LEFT LOWER EXTREMITY:  range of motion not tested due to surgery, normal strength & tone RIGHT LOWER EXTREMITY:  limited range of motion, normal strength & tone PSYCHIATRIC: the patient is alert & oriented to person, affect & behavior appropriate  LABS/RADIOLOGY:  02-23-14 BMP normal, hemoglobin 11.5 otherwise CBC normal urinalysis negative  Labs reviewed: Basic Metabolic Panel:  Recent Labs  04/29/13 0812  NA 139  K 4.2  CL 105  CO2 27  GLUCOSE 92  BUN 13  CREATININE 0.7  CALCIUM 9.0   CBC:  Recent Labs  04/29/13 0812  WBC 7.7  HGB 12.4  HCT 36.7  MCV 87.3  PLT 299.0    ASSESSMENT/PLAN:  Left hip osteoarthritis-status post total hip arthroplasty. Continue rehabilitation. CAD-stable Atrial fibrillation-rate controlled Hypothyroidism-continue levothyroxine Hypokalemia-continue supplementation. Recheck Hyperlipidemia-continue Lipitor Check CBC  I have reviewed patient's medical records received at admission/from hospitalization.  CPT CODE: 18563  Bookert Guzzi Y Barron [place], [place] [phone]

## 2014-03-10 DIAGNOSIS — Z5189 Encounter for other specified aftercare: Secondary | ICD-10-CM | POA: Diagnosis not present

## 2014-03-10 DIAGNOSIS — T8489XA Other specified complication of internal orthopedic prosthetic devices, implants and grafts, initial encounter: Secondary | ICD-10-CM | POA: Diagnosis not present

## 2014-03-10 DIAGNOSIS — Z471 Aftercare following joint replacement surgery: Secondary | ICD-10-CM | POA: Diagnosis not present

## 2014-03-10 DIAGNOSIS — M1610 Unilateral primary osteoarthritis, unspecified hip: Secondary | ICD-10-CM | POA: Diagnosis not present

## 2014-03-10 DIAGNOSIS — G8918 Other acute postprocedural pain: Secondary | ICD-10-CM | POA: Diagnosis not present

## 2014-03-22 ENCOUNTER — Other Ambulatory Visit: Payer: Self-pay | Admitting: *Deleted

## 2014-03-22 MED ORDER — OXYCODONE HCL 5 MG PO TABS: ORAL_TABLET | ORAL | Status: DC

## 2014-03-22 NOTE — Telephone Encounter (Signed)
[facility] 

## 2014-03-31 DIAGNOSIS — Z96649 Presence of unspecified artificial hip joint: Secondary | ICD-10-CM | POA: Diagnosis not present

## 2014-03-31 DIAGNOSIS — R21 Rash and other nonspecific skin eruption: Secondary | ICD-10-CM | POA: Diagnosis not present

## 2014-03-31 DIAGNOSIS — B370 Candidal stomatitis: Secondary | ICD-10-CM | POA: Diagnosis not present

## 2014-03-31 DIAGNOSIS — M169 Osteoarthritis of hip, unspecified: Secondary | ICD-10-CM | POA: Diagnosis not present

## 2014-03-31 DIAGNOSIS — Z5189 Encounter for other specified aftercare: Secondary | ICD-10-CM | POA: Diagnosis not present

## 2014-03-31 DIAGNOSIS — M1610 Unilateral primary osteoarthritis, unspecified hip: Secondary | ICD-10-CM | POA: Diagnosis not present

## 2014-03-31 DIAGNOSIS — M5137 Other intervertebral disc degeneration, lumbosacral region: Secondary | ICD-10-CM | POA: Diagnosis not present

## 2014-04-08 ENCOUNTER — Non-Acute Institutional Stay (SKILLED_NURSING_FACILITY): Payer: Medicare Other | Admitting: Adult Health

## 2014-04-08 ENCOUNTER — Encounter: Payer: Self-pay | Admitting: Adult Health

## 2014-04-08 DIAGNOSIS — F329 Major depressive disorder, single episode, unspecified: Secondary | ICD-10-CM | POA: Diagnosis not present

## 2014-04-08 DIAGNOSIS — M1612 Unilateral primary osteoarthritis, left hip: Secondary | ICD-10-CM

## 2014-04-08 DIAGNOSIS — I480 Paroxysmal atrial fibrillation: Secondary | ICD-10-CM

## 2014-04-08 DIAGNOSIS — E876 Hypokalemia: Secondary | ICD-10-CM

## 2014-04-08 DIAGNOSIS — E785 Hyperlipidemia, unspecified: Secondary | ICD-10-CM

## 2014-04-08 DIAGNOSIS — N318 Other neuromuscular dysfunction of bladder: Secondary | ICD-10-CM

## 2014-04-08 DIAGNOSIS — N3281 Overactive bladder: Secondary | ICD-10-CM

## 2014-04-08 DIAGNOSIS — E039 Hypothyroidism, unspecified: Secondary | ICD-10-CM | POA: Diagnosis not present

## 2014-04-08 DIAGNOSIS — F3289 Other specified depressive episodes: Secondary | ICD-10-CM | POA: Diagnosis not present

## 2014-04-08 DIAGNOSIS — F32A Depression, unspecified: Secondary | ICD-10-CM

## 2014-04-08 DIAGNOSIS — I4891 Unspecified atrial fibrillation: Secondary | ICD-10-CM

## 2014-04-08 DIAGNOSIS — M1610 Unilateral primary osteoarthritis, unspecified hip: Secondary | ICD-10-CM

## 2014-04-08 DIAGNOSIS — I10 Essential (primary) hypertension: Secondary | ICD-10-CM

## 2014-04-08 DIAGNOSIS — R609 Edema, unspecified: Secondary | ICD-10-CM

## 2014-04-08 DIAGNOSIS — K5900 Constipation, unspecified: Secondary | ICD-10-CM

## 2014-04-08 DIAGNOSIS — R600 Localized edema: Secondary | ICD-10-CM

## 2014-04-08 NOTE — Progress Notes (Signed)
Patient ID: [name], female   DOB: 08/06/1940, 73 y.o.   MRN: [id number]              PROGRESS NOTE  DATE: 04/08/2014   FACILITY: [facility]  LEVEL OF CARE: SNF (31)  Acute Visit  CHIEF COMPLAINT:  Discharge Notes   HISTORY OF PRESENT ILLNESS: This is a 73 year old female who is for discharge home. She has been admitted to [facility] on 03/01/14 from [facility] with Arthritis S/P left total hip arthroplasty. Patient was admitted to this facility for short-term rehabilitation after the patient's recent hospitalization.  Patient has completed SNF rehabilitation and therapy has cleared the patient for discharge.  Reassessment of ongoing problem(s):  HTN: Pt 's HTN remains stable.  Denies CP, sob, DOE, pedal edema, headaches, dizziness or visual disturbances.  No complications from the medications currently being used.  Last BP : 131/60  ATRIAL FIBRILLATION: the patients atrial fibrillation remains stable.  The patient denies DOE, tachycardia, orthopnea, transient neurological sx, pedal edema, palpitations, & PNDs.  No complications noted from the medications currently being used.  DEPRESSION: The depression remains stable. Patient denies ongoing feelings of sadness, insomnia, anedhonia or lack of appetite. No complications reported from the medications currently being used. Staff do not report behavioral problems.   PAST MEDICAL HISTORY : Reviewed.  No changes/see problem list  CURRENT MEDICATIONS: Reviewed per MAR/see medication list  REVIEW OF SYSTEMS:  GENERAL: no change in appetite, no fatigue, no weight changes, no fever, chills or weakness RESPIRATORY: no cough, SOB, DOE, wheezing, hemoptysis CARDIAC: no chest pain, or palpitations, + edema GI: no abdominal pain, diarrhea, constipation, heart burn, nausea or vomiting  PHYSICAL EXAMINATION  GENERAL: no acute distress, normal body habitus EYES: conjunctivae normal, sclerae normal,  normal eye lids NECK: supple, trachea midline, no neck masses, no thyroid tenderness, no thyromegaly LYMPHATICS: no LAN in the neck, no supraclavicular LAN RESPIRATORY: breathing is even & unlabored, BS CTAB CARDIAC: Irregularly irregular, no murmur,no extra heart sounds, BLE edema 1+ GI: abdomen soft, normal BS, no masses, no tenderness, no hepatomegaly, no splenomegaly EXTREMITIES:  Able to move all 4 extremities PSYCHIATRIC: the patient is alert & oriented to person, affect & behavior appropriate  LABS/RADIOLOGY: 02/23/14  sodium 138 potassium 4.2 glucose 114 BUN 13 creatinine 0.7 hemoglobin 11.5 hematocrit 0.36 MCV 86 platelet 355 WBC 7.7  ASSESSMENT/PLAN:  Arthritis status post left total hip arthroplasty - had short-term rehabilitation Hypertension - well controlled; continue Cardizem CD Hypothyroidism - stable; continue Synthroid Depression - stable; continue Paxil Atrial fibrillation - rate controlled; continue Eliquis Hyperlipidemia - continue Lipitor Overactive bladder - stable; continue Ditropan Constipation - continue Colace and Senna- S Hypokalemia - continue supplementation Lower extremity edema - continue Lasix   I have filled out patient's discharge paperwork and written prescriptions.    Total discharge time: Less than 30 minutes  Discharge time involved coordination of the discharge process with Education officer, museum, nursing staff and therapy department.   CPT CODE: 63785  Seth Bake - NP [facility] [phone]

## 2014-04-21 DIAGNOSIS — M25669 Stiffness of unspecified knee, not elsewhere classified: Secondary | ICD-10-CM | POA: Diagnosis not present

## 2014-04-21 DIAGNOSIS — R269 Unspecified abnormalities of gait and mobility: Secondary | ICD-10-CM | POA: Diagnosis not present

## 2014-04-21 DIAGNOSIS — M25569 Pain in unspecified knee: Secondary | ICD-10-CM | POA: Diagnosis not present

## 2014-04-23 DIAGNOSIS — M25569 Pain in unspecified knee: Secondary | ICD-10-CM | POA: Diagnosis not present

## 2014-04-23 DIAGNOSIS — M1710 Unilateral primary osteoarthritis, unspecified knee: Secondary | ICD-10-CM | POA: Diagnosis not present

## 2014-04-23 DIAGNOSIS — Z96649 Presence of unspecified artificial hip joint: Secondary | ICD-10-CM | POA: Diagnosis not present

## 2014-04-23 DIAGNOSIS — M25669 Stiffness of unspecified knee, not elsewhere classified: Secondary | ICD-10-CM | POA: Diagnosis not present

## 2014-04-26 DIAGNOSIS — M25669 Stiffness of unspecified knee, not elsewhere classified: Secondary | ICD-10-CM | POA: Diagnosis not present

## 2014-04-26 DIAGNOSIS — M1610 Unilateral primary osteoarthritis, unspecified hip: Secondary | ICD-10-CM | POA: Diagnosis not present

## 2014-04-26 DIAGNOSIS — M25569 Pain in unspecified knee: Secondary | ICD-10-CM | POA: Diagnosis not present

## 2014-04-26 DIAGNOSIS — M1710 Unilateral primary osteoarthritis, unspecified knee: Secondary | ICD-10-CM | POA: Diagnosis not present

## 2014-04-30 DIAGNOSIS — M1710 Unilateral primary osteoarthritis, unspecified knee: Secondary | ICD-10-CM | POA: Diagnosis not present

## 2014-04-30 DIAGNOSIS — R531 Weakness: Secondary | ICD-10-CM | POA: Diagnosis not present

## 2014-04-30 DIAGNOSIS — M169 Osteoarthritis of hip, unspecified: Secondary | ICD-10-CM | POA: Diagnosis not present

## 2014-04-30 DIAGNOSIS — M25669 Stiffness of unspecified knee, not elsewhere classified: Secondary | ICD-10-CM | POA: Diagnosis not present

## 2014-05-03 DIAGNOSIS — R531 Weakness: Secondary | ICD-10-CM | POA: Diagnosis not present

## 2014-05-03 DIAGNOSIS — M25669 Stiffness of unspecified knee, not elsewhere classified: Secondary | ICD-10-CM | POA: Diagnosis not present

## 2014-05-03 DIAGNOSIS — M1710 Unilateral primary osteoarthritis, unspecified knee: Secondary | ICD-10-CM | POA: Diagnosis not present

## 2014-05-03 DIAGNOSIS — M169 Osteoarthritis of hip, unspecified: Secondary | ICD-10-CM | POA: Diagnosis not present

## 2014-05-05 DIAGNOSIS — M25669 Stiffness of unspecified knee, not elsewhere classified: Secondary | ICD-10-CM | POA: Diagnosis not present

## 2014-05-05 DIAGNOSIS — M1710 Unilateral primary osteoarthritis, unspecified knee: Secondary | ICD-10-CM | POA: Diagnosis not present

## 2014-05-05 DIAGNOSIS — M169 Osteoarthritis of hip, unspecified: Secondary | ICD-10-CM | POA: Diagnosis not present

## 2014-05-05 DIAGNOSIS — R531 Weakness: Secondary | ICD-10-CM | POA: Diagnosis not present

## 2014-05-07 DIAGNOSIS — M169 Osteoarthritis of hip, unspecified: Secondary | ICD-10-CM | POA: Diagnosis not present

## 2014-05-07 DIAGNOSIS — R531 Weakness: Secondary | ICD-10-CM | POA: Diagnosis not present

## 2014-05-07 DIAGNOSIS — M25669 Stiffness of unspecified knee, not elsewhere classified: Secondary | ICD-10-CM | POA: Diagnosis not present

## 2014-05-07 DIAGNOSIS — M1710 Unilateral primary osteoarthritis, unspecified knee: Secondary | ICD-10-CM | POA: Diagnosis not present

## 2014-05-10 DIAGNOSIS — R531 Weakness: Secondary | ICD-10-CM | POA: Diagnosis not present

## 2014-05-10 DIAGNOSIS — M1710 Unilateral primary osteoarthritis, unspecified knee: Secondary | ICD-10-CM | POA: Diagnosis not present

## 2014-05-10 DIAGNOSIS — Z961 Presence of intraocular lens: Secondary | ICD-10-CM | POA: Diagnosis not present

## 2014-05-10 DIAGNOSIS — M25669 Stiffness of unspecified knee, not elsewhere classified: Secondary | ICD-10-CM | POA: Diagnosis not present

## 2014-05-10 DIAGNOSIS — M169 Osteoarthritis of hip, unspecified: Secondary | ICD-10-CM | POA: Diagnosis not present

## 2014-05-12 DIAGNOSIS — R531 Weakness: Secondary | ICD-10-CM | POA: Diagnosis not present

## 2014-05-12 DIAGNOSIS — M25669 Stiffness of unspecified knee, not elsewhere classified: Secondary | ICD-10-CM | POA: Diagnosis not present

## 2014-05-12 DIAGNOSIS — M1710 Unilateral primary osteoarthritis, unspecified knee: Secondary | ICD-10-CM | POA: Diagnosis not present

## 2014-05-12 DIAGNOSIS — M169 Osteoarthritis of hip, unspecified: Secondary | ICD-10-CM | POA: Diagnosis not present

## 2014-05-14 DIAGNOSIS — N3281 Overactive bladder: Secondary | ICD-10-CM | POA: Diagnosis not present

## 2014-05-14 DIAGNOSIS — F331 Major depressive disorder, recurrent, moderate: Secondary | ICD-10-CM | POA: Diagnosis not present

## 2014-05-14 DIAGNOSIS — I10 Essential (primary) hypertension: Secondary | ICD-10-CM | POA: Diagnosis not present

## 2014-05-17 DIAGNOSIS — R531 Weakness: Secondary | ICD-10-CM | POA: Diagnosis not present

## 2014-05-17 DIAGNOSIS — M1710 Unilateral primary osteoarthritis, unspecified knee: Secondary | ICD-10-CM | POA: Diagnosis not present

## 2014-05-17 DIAGNOSIS — M169 Osteoarthritis of hip, unspecified: Secondary | ICD-10-CM | POA: Diagnosis not present

## 2014-05-17 DIAGNOSIS — M25669 Stiffness of unspecified knee, not elsewhere classified: Secondary | ICD-10-CM | POA: Diagnosis not present

## 2014-05-19 DIAGNOSIS — M1710 Unilateral primary osteoarthritis, unspecified knee: Secondary | ICD-10-CM | POA: Diagnosis not present

## 2014-05-19 DIAGNOSIS — M25669 Stiffness of unspecified knee, not elsewhere classified: Secondary | ICD-10-CM | POA: Diagnosis not present

## 2014-05-19 DIAGNOSIS — R531 Weakness: Secondary | ICD-10-CM | POA: Diagnosis not present

## 2014-05-19 DIAGNOSIS — M169 Osteoarthritis of hip, unspecified: Secondary | ICD-10-CM | POA: Diagnosis not present

## 2014-05-20 DIAGNOSIS — M25669 Stiffness of unspecified knee, not elsewhere classified: Secondary | ICD-10-CM | POA: Diagnosis not present

## 2014-05-20 DIAGNOSIS — M169 Osteoarthritis of hip, unspecified: Secondary | ICD-10-CM | POA: Diagnosis not present

## 2014-05-20 DIAGNOSIS — M1710 Unilateral primary osteoarthritis, unspecified knee: Secondary | ICD-10-CM | POA: Diagnosis not present

## 2014-05-20 DIAGNOSIS — R531 Weakness: Secondary | ICD-10-CM | POA: Diagnosis not present

## 2014-05-26 DIAGNOSIS — R531 Weakness: Secondary | ICD-10-CM | POA: Diagnosis not present

## 2014-05-26 DIAGNOSIS — M1710 Unilateral primary osteoarthritis, unspecified knee: Secondary | ICD-10-CM | POA: Diagnosis not present

## 2014-05-26 DIAGNOSIS — M169 Osteoarthritis of hip, unspecified: Secondary | ICD-10-CM | POA: Diagnosis not present

## 2014-05-26 DIAGNOSIS — M25669 Stiffness of unspecified knee, not elsewhere classified: Secondary | ICD-10-CM | POA: Diagnosis not present

## 2014-05-27 ENCOUNTER — Ambulatory Visit (INDEPENDENT_AMBULATORY_CARE_PROVIDER_SITE_OTHER): Payer: Medicare Other

## 2014-05-27 ENCOUNTER — Ambulatory Visit (INDEPENDENT_AMBULATORY_CARE_PROVIDER_SITE_OTHER): Payer: Medicare Other | Admitting: Podiatry

## 2014-05-27 ENCOUNTER — Encounter: Payer: Self-pay | Admitting: Podiatry

## 2014-05-27 VITALS — BP 143/70 | HR 75 | Resp 18 | Ht 67.0 in | Wt 190.0 lb

## 2014-05-27 DIAGNOSIS — M722 Plantar fascial fibromatosis: Secondary | ICD-10-CM | POA: Diagnosis not present

## 2014-05-27 DIAGNOSIS — I2510 Atherosclerotic heart disease of native coronary artery without angina pectoris: Secondary | ICD-10-CM

## 2014-05-27 NOTE — Progress Notes (Signed)
   Subjective:    Patient ID: [name], female    DOB: 02/18/1941, 73 y.o.   MRN: [id number]  HPI Comments: Right plantar heel pain for about 2 months now, sharp pain sometimes   Foot Pain      Review of Systems  All other systems reviewed and are negative.      Objective:   Physical Exam: I have reviewed her past medical history medications allergy surgery social history and review of systems. Pulses are palpable right foot. Neurologic sensorium is intact bilateral deep tendon reflexes are intact bilateral muscle strength is 5 over 5 dorsiflexion plantar flexors and inverters everters onto the musculature is intact. Orthopedic evaluation demonstrates pain on palpation medially continued tubercle of the right heel. Radiographic evaluation demonstrates soft tissue increase in density at the plantar fascial calcaneal insertion site.        Assessment & Plan:  Assessment: Plantar fasciitis right.  Plan: Injected the right heel with Kenalog and local anesthetic today placed her in a plantar fascial brace and a night splint. Discussed appropriate shoe gear stretching exercises and ice therapy. We'll follow up with her in 1 month.

## 2014-05-28 DIAGNOSIS — M169 Osteoarthritis of hip, unspecified: Secondary | ICD-10-CM | POA: Diagnosis not present

## 2014-05-28 DIAGNOSIS — R531 Weakness: Secondary | ICD-10-CM | POA: Diagnosis not present

## 2014-05-28 DIAGNOSIS — M25669 Stiffness of unspecified knee, not elsewhere classified: Secondary | ICD-10-CM | POA: Diagnosis not present

## 2014-05-28 DIAGNOSIS — M1710 Unilateral primary osteoarthritis, unspecified knee: Secondary | ICD-10-CM | POA: Diagnosis not present

## 2014-06-01 DIAGNOSIS — M169 Osteoarthritis of hip, unspecified: Secondary | ICD-10-CM | POA: Diagnosis not present

## 2014-06-01 DIAGNOSIS — M1710 Unilateral primary osteoarthritis, unspecified knee: Secondary | ICD-10-CM | POA: Diagnosis not present

## 2014-06-01 DIAGNOSIS — M25669 Stiffness of unspecified knee, not elsewhere classified: Secondary | ICD-10-CM | POA: Diagnosis not present

## 2014-06-01 DIAGNOSIS — R531 Weakness: Secondary | ICD-10-CM | POA: Diagnosis not present

## 2014-06-02 DIAGNOSIS — M1712 Unilateral primary osteoarthritis, left knee: Secondary | ICD-10-CM | POA: Diagnosis not present

## 2014-06-02 DIAGNOSIS — Z96652 Presence of left artificial knee joint: Secondary | ICD-10-CM | POA: Diagnosis not present

## 2014-06-02 DIAGNOSIS — M1612 Unilateral primary osteoarthritis, left hip: Secondary | ICD-10-CM | POA: Diagnosis not present

## 2014-06-02 DIAGNOSIS — Z471 Aftercare following joint replacement surgery: Secondary | ICD-10-CM | POA: Diagnosis not present

## 2014-06-02 DIAGNOSIS — Z96642 Presence of left artificial hip joint: Secondary | ICD-10-CM | POA: Diagnosis not present

## 2014-06-03 DIAGNOSIS — M169 Osteoarthritis of hip, unspecified: Secondary | ICD-10-CM | POA: Diagnosis not present

## 2014-06-03 DIAGNOSIS — M1710 Unilateral primary osteoarthritis, unspecified knee: Secondary | ICD-10-CM | POA: Diagnosis not present

## 2014-06-03 DIAGNOSIS — R531 Weakness: Secondary | ICD-10-CM | POA: Diagnosis not present

## 2014-06-03 DIAGNOSIS — M25669 Stiffness of unspecified knee, not elsewhere classified: Secondary | ICD-10-CM | POA: Diagnosis not present

## 2014-06-07 DIAGNOSIS — R531 Weakness: Secondary | ICD-10-CM | POA: Diagnosis not present

## 2014-06-07 DIAGNOSIS — M169 Osteoarthritis of hip, unspecified: Secondary | ICD-10-CM | POA: Diagnosis not present

## 2014-06-07 DIAGNOSIS — M25669 Stiffness of unspecified knee, not elsewhere classified: Secondary | ICD-10-CM | POA: Diagnosis not present

## 2014-06-07 DIAGNOSIS — M1710 Unilateral primary osteoarthritis, unspecified knee: Secondary | ICD-10-CM | POA: Diagnosis not present

## 2014-06-09 DIAGNOSIS — M169 Osteoarthritis of hip, unspecified: Secondary | ICD-10-CM | POA: Diagnosis not present

## 2014-06-09 DIAGNOSIS — Z853 Personal history of malignant neoplasm of breast: Secondary | ICD-10-CM | POA: Diagnosis not present

## 2014-06-09 DIAGNOSIS — M1710 Unilateral primary osteoarthritis, unspecified knee: Secondary | ICD-10-CM | POA: Diagnosis not present

## 2014-06-09 DIAGNOSIS — M25669 Stiffness of unspecified knee, not elsewhere classified: Secondary | ICD-10-CM | POA: Diagnosis not present

## 2014-06-09 DIAGNOSIS — R531 Weakness: Secondary | ICD-10-CM | POA: Diagnosis not present

## 2014-06-09 DIAGNOSIS — Z1231 Encounter for screening mammogram for malignant neoplasm of breast: Secondary | ICD-10-CM | POA: Diagnosis not present

## 2014-06-14 DIAGNOSIS — G6289 Other specified polyneuropathies: Secondary | ICD-10-CM | POA: Diagnosis not present

## 2014-06-14 DIAGNOSIS — Z96652 Presence of left artificial knee joint: Secondary | ICD-10-CM | POA: Diagnosis not present

## 2014-06-21 DIAGNOSIS — M169 Osteoarthritis of hip, unspecified: Secondary | ICD-10-CM | POA: Diagnosis not present

## 2014-06-21 DIAGNOSIS — M1710 Unilateral primary osteoarthritis, unspecified knee: Secondary | ICD-10-CM | POA: Diagnosis not present

## 2014-06-21 DIAGNOSIS — R531 Weakness: Secondary | ICD-10-CM | POA: Diagnosis not present

## 2014-06-21 DIAGNOSIS — M25669 Stiffness of unspecified knee, not elsewhere classified: Secondary | ICD-10-CM | POA: Diagnosis not present

## 2014-06-23 DIAGNOSIS — M25562 Pain in left knee: Secondary | ICD-10-CM | POA: Diagnosis not present

## 2014-06-23 DIAGNOSIS — S76112A Strain of left quadriceps muscle, fascia and tendon, initial encounter: Secondary | ICD-10-CM | POA: Diagnosis not present

## 2014-06-23 DIAGNOSIS — R600 Localized edema: Secondary | ICD-10-CM | POA: Diagnosis not present

## 2014-06-29 DIAGNOSIS — R531 Weakness: Secondary | ICD-10-CM | POA: Diagnosis not present

## 2014-06-29 DIAGNOSIS — M25669 Stiffness of unspecified knee, not elsewhere classified: Secondary | ICD-10-CM | POA: Diagnosis not present

## 2014-06-29 DIAGNOSIS — M169 Osteoarthritis of hip, unspecified: Secondary | ICD-10-CM | POA: Diagnosis not present

## 2014-06-29 DIAGNOSIS — M1710 Unilateral primary osteoarthritis, unspecified knee: Secondary | ICD-10-CM | POA: Diagnosis not present

## 2014-07-01 ENCOUNTER — Ambulatory Visit: Payer: Medicare Other | Admitting: Podiatry

## 2014-07-01 ENCOUNTER — Other Ambulatory Visit: Payer: Self-pay | Admitting: Interventional Cardiology

## 2014-07-01 ENCOUNTER — Encounter: Payer: Self-pay | Admitting: Podiatry

## 2014-07-01 ENCOUNTER — Ambulatory Visit (INDEPENDENT_AMBULATORY_CARE_PROVIDER_SITE_OTHER): Payer: Medicare Other | Admitting: Podiatry

## 2014-07-01 VITALS — BP 151/88 | HR 75 | Resp 16

## 2014-07-01 DIAGNOSIS — M722 Plantar fascial fibromatosis: Secondary | ICD-10-CM | POA: Diagnosis not present

## 2014-07-01 DIAGNOSIS — I2510 Atherosclerotic heart disease of native coronary artery without angina pectoris: Secondary | ICD-10-CM

## 2014-07-01 NOTE — Progress Notes (Signed)
She presents today for follow-up of her plantar fasciitis and states that she is approximately 80% improved. She states that it comes back occasionally.  Objective: Vital signs are stable she is alert and oriented 3. Pulses are palpable bilateral. She has pain on palpation medial calcaneal tubercle right heel.  Assessment: Resolving plantar fasciitis with some residual remaining.  Plan: Reinjected today with Kenalog and local anesthetic. Suggest that she continue all conservative therapies.

## 2014-07-02 NOTE — Telephone Encounter (Signed)
Is the patient back on this medication? She has not had it refilled since April and per Dr Darliss Ridgel documentation on the last office note her physician at [facility] would tell her when to resume this. Please advise. Thanks, MI

## 2014-07-05 DIAGNOSIS — M1710 Unilateral primary osteoarthritis, unspecified knee: Secondary | ICD-10-CM | POA: Diagnosis not present

## 2014-07-05 DIAGNOSIS — R531 Weakness: Secondary | ICD-10-CM | POA: Diagnosis not present

## 2014-07-05 DIAGNOSIS — M169 Osteoarthritis of hip, unspecified: Secondary | ICD-10-CM | POA: Diagnosis not present

## 2014-07-05 DIAGNOSIS — M25669 Stiffness of unspecified knee, not elsewhere classified: Secondary | ICD-10-CM | POA: Diagnosis not present

## 2014-07-07 DIAGNOSIS — M1710 Unilateral primary osteoarthritis, unspecified knee: Secondary | ICD-10-CM | POA: Diagnosis not present

## 2014-07-07 DIAGNOSIS — M169 Osteoarthritis of hip, unspecified: Secondary | ICD-10-CM | POA: Diagnosis not present

## 2014-07-07 DIAGNOSIS — R531 Weakness: Secondary | ICD-10-CM | POA: Diagnosis not present

## 2014-07-07 DIAGNOSIS — M25669 Stiffness of unspecified knee, not elsewhere classified: Secondary | ICD-10-CM | POA: Diagnosis not present

## 2014-07-08 ENCOUNTER — Ambulatory Visit: Payer: Medicare Other | Admitting: Podiatry

## 2014-07-12 DIAGNOSIS — M169 Osteoarthritis of hip, unspecified: Secondary | ICD-10-CM | POA: Diagnosis not present

## 2014-07-12 DIAGNOSIS — M25669 Stiffness of unspecified knee, not elsewhere classified: Secondary | ICD-10-CM | POA: Diagnosis not present

## 2014-07-12 DIAGNOSIS — R531 Weakness: Secondary | ICD-10-CM | POA: Diagnosis not present

## 2014-07-12 DIAGNOSIS — M1710 Unilateral primary osteoarthritis, unspecified knee: Secondary | ICD-10-CM | POA: Diagnosis not present

## 2014-07-14 DIAGNOSIS — M1710 Unilateral primary osteoarthritis, unspecified knee: Secondary | ICD-10-CM | POA: Diagnosis not present

## 2014-07-14 DIAGNOSIS — M25669 Stiffness of unspecified knee, not elsewhere classified: Secondary | ICD-10-CM | POA: Diagnosis not present

## 2014-07-14 DIAGNOSIS — M169 Osteoarthritis of hip, unspecified: Secondary | ICD-10-CM | POA: Diagnosis not present

## 2014-07-14 DIAGNOSIS — R531 Weakness: Secondary | ICD-10-CM | POA: Diagnosis not present

## 2014-07-28 DIAGNOSIS — M170 Bilateral primary osteoarthritis of knee: Secondary | ICD-10-CM | POA: Diagnosis not present

## 2014-07-28 DIAGNOSIS — M169 Osteoarthritis of hip, unspecified: Secondary | ICD-10-CM | POA: Diagnosis not present

## 2014-07-28 DIAGNOSIS — R531 Weakness: Secondary | ICD-10-CM | POA: Diagnosis not present

## 2014-07-28 DIAGNOSIS — M25669 Stiffness of unspecified knee, not elsewhere classified: Secondary | ICD-10-CM | POA: Diagnosis not present

## 2014-07-29 DIAGNOSIS — M1710 Unilateral primary osteoarthritis, unspecified knee: Secondary | ICD-10-CM | POA: Diagnosis not present

## 2014-07-29 DIAGNOSIS — R531 Weakness: Secondary | ICD-10-CM | POA: Diagnosis not present

## 2014-07-29 DIAGNOSIS — M25669 Stiffness of unspecified knee, not elsewhere classified: Secondary | ICD-10-CM | POA: Diagnosis not present

## 2014-07-29 DIAGNOSIS — M169 Osteoarthritis of hip, unspecified: Secondary | ICD-10-CM | POA: Diagnosis not present

## 2014-08-02 DIAGNOSIS — I480 Paroxysmal atrial fibrillation: Secondary | ICD-10-CM | POA: Diagnosis not present

## 2014-08-02 DIAGNOSIS — J069 Acute upper respiratory infection, unspecified: Secondary | ICD-10-CM | POA: Diagnosis not present

## 2014-08-09 DIAGNOSIS — M25562 Pain in left knee: Secondary | ICD-10-CM | POA: Diagnosis not present

## 2014-08-09 DIAGNOSIS — M169 Osteoarthritis of hip, unspecified: Secondary | ICD-10-CM | POA: Diagnosis not present

## 2014-08-09 DIAGNOSIS — M25669 Stiffness of unspecified knee, not elsewhere classified: Secondary | ICD-10-CM | POA: Diagnosis not present

## 2014-08-09 DIAGNOSIS — M1710 Unilateral primary osteoarthritis, unspecified knee: Secondary | ICD-10-CM | POA: Diagnosis not present

## 2014-08-12 DIAGNOSIS — M1710 Unilateral primary osteoarthritis, unspecified knee: Secondary | ICD-10-CM | POA: Diagnosis not present

## 2014-08-12 DIAGNOSIS — M169 Osteoarthritis of hip, unspecified: Secondary | ICD-10-CM | POA: Diagnosis not present

## 2014-08-12 DIAGNOSIS — J069 Acute upper respiratory infection, unspecified: Secondary | ICD-10-CM | POA: Diagnosis not present

## 2014-08-12 DIAGNOSIS — R531 Weakness: Secondary | ICD-10-CM | POA: Diagnosis not present

## 2014-08-12 DIAGNOSIS — M25669 Stiffness of unspecified knee, not elsewhere classified: Secondary | ICD-10-CM | POA: Diagnosis not present

## 2014-08-24 DIAGNOSIS — M169 Osteoarthritis of hip, unspecified: Secondary | ICD-10-CM | POA: Diagnosis not present

## 2014-08-24 DIAGNOSIS — R531 Weakness: Secondary | ICD-10-CM | POA: Diagnosis not present

## 2014-08-24 DIAGNOSIS — M25669 Stiffness of unspecified knee, not elsewhere classified: Secondary | ICD-10-CM | POA: Diagnosis not present

## 2014-08-24 DIAGNOSIS — M1710 Unilateral primary osteoarthritis, unspecified knee: Secondary | ICD-10-CM | POA: Diagnosis not present

## 2014-08-25 DIAGNOSIS — Z471 Aftercare following joint replacement surgery: Secondary | ICD-10-CM | POA: Diagnosis not present

## 2014-08-25 DIAGNOSIS — M1612 Unilateral primary osteoarthritis, left hip: Secondary | ICD-10-CM | POA: Diagnosis not present

## 2014-08-25 DIAGNOSIS — Z96652 Presence of left artificial knee joint: Secondary | ICD-10-CM | POA: Diagnosis not present

## 2014-08-25 DIAGNOSIS — M1712 Unilateral primary osteoarthritis, left knee: Secondary | ICD-10-CM | POA: Diagnosis not present

## 2014-08-26 DIAGNOSIS — M169 Osteoarthritis of hip, unspecified: Secondary | ICD-10-CM | POA: Diagnosis not present

## 2014-08-26 DIAGNOSIS — R531 Weakness: Secondary | ICD-10-CM | POA: Diagnosis not present

## 2014-08-26 DIAGNOSIS — M25669 Stiffness of unspecified knee, not elsewhere classified: Secondary | ICD-10-CM | POA: Diagnosis not present

## 2014-08-26 DIAGNOSIS — M1710 Unilateral primary osteoarthritis, unspecified knee: Secondary | ICD-10-CM | POA: Diagnosis not present

## 2014-08-30 DIAGNOSIS — M25669 Stiffness of unspecified knee, not elsewhere classified: Secondary | ICD-10-CM | POA: Diagnosis not present

## 2014-08-30 DIAGNOSIS — M1710 Unilateral primary osteoarthritis, unspecified knee: Secondary | ICD-10-CM | POA: Diagnosis not present

## 2014-08-30 DIAGNOSIS — M169 Osteoarthritis of hip, unspecified: Secondary | ICD-10-CM | POA: Diagnosis not present

## 2014-08-30 DIAGNOSIS — R531 Weakness: Secondary | ICD-10-CM | POA: Diagnosis not present

## 2014-09-23 DIAGNOSIS — R739 Hyperglycemia, unspecified: Secondary | ICD-10-CM | POA: Diagnosis not present

## 2014-09-23 DIAGNOSIS — G6289 Other specified polyneuropathies: Secondary | ICD-10-CM | POA: Diagnosis not present

## 2014-09-23 DIAGNOSIS — R413 Other amnesia: Secondary | ICD-10-CM | POA: Diagnosis not present

## 2014-09-23 DIAGNOSIS — R292 Abnormal reflex: Secondary | ICD-10-CM | POA: Diagnosis not present

## 2014-09-23 DIAGNOSIS — G4733 Obstructive sleep apnea (adult) (pediatric): Secondary | ICD-10-CM | POA: Diagnosis not present

## 2014-09-27 DIAGNOSIS — R269 Unspecified abnormalities of gait and mobility: Secondary | ICD-10-CM | POA: Diagnosis not present

## 2014-10-11 DIAGNOSIS — R413 Other amnesia: Secondary | ICD-10-CM | POA: Diagnosis not present

## 2014-10-11 DIAGNOSIS — M5124 Other intervertebral disc displacement, thoracic region: Secondary | ICD-10-CM | POA: Diagnosis not present

## 2014-10-11 DIAGNOSIS — R292 Abnormal reflex: Secondary | ICD-10-CM | POA: Diagnosis not present

## 2014-10-11 DIAGNOSIS — R269 Unspecified abnormalities of gait and mobility: Secondary | ICD-10-CM | POA: Diagnosis not present

## 2014-10-11 DIAGNOSIS — M5022 Other cervical disc displacement, mid-cervical region: Secondary | ICD-10-CM | POA: Diagnosis not present

## 2014-10-13 DIAGNOSIS — R269 Unspecified abnormalities of gait and mobility: Secondary | ICD-10-CM | POA: Diagnosis not present

## 2014-10-20 DIAGNOSIS — T1490 Injury, unspecified: Secondary | ICD-10-CM | POA: Diagnosis not present

## 2014-10-20 DIAGNOSIS — W06XXXA Fall from bed, initial encounter: Secondary | ICD-10-CM | POA: Diagnosis not present

## 2014-10-20 DIAGNOSIS — S51801A Unspecified open wound of right forearm, initial encounter: Secondary | ICD-10-CM | POA: Diagnosis not present

## 2014-10-20 DIAGNOSIS — R58 Hemorrhage, not elsewhere classified: Secondary | ICD-10-CM | POA: Diagnosis not present

## 2014-10-21 DIAGNOSIS — W06XXXA Fall from bed, initial encounter: Secondary | ICD-10-CM | POA: Diagnosis not present

## 2014-10-21 DIAGNOSIS — Z9010 Acquired absence of unspecified breast and nipple: Secondary | ICD-10-CM | POA: Diagnosis not present

## 2014-10-21 DIAGNOSIS — Z90711 Acquired absence of uterus with remaining cervical stump: Secondary | ICD-10-CM | POA: Diagnosis not present

## 2014-10-21 DIAGNOSIS — Z79899 Other long term (current) drug therapy: Secondary | ICD-10-CM | POA: Diagnosis not present

## 2014-10-21 DIAGNOSIS — S8991XA Unspecified injury of right lower leg, initial encounter: Secondary | ICD-10-CM | POA: Diagnosis not present

## 2014-10-21 DIAGNOSIS — S8992XA Unspecified injury of left lower leg, initial encounter: Secondary | ICD-10-CM | POA: Diagnosis not present

## 2014-10-21 DIAGNOSIS — T1490 Injury, unspecified: Secondary | ICD-10-CM | POA: Diagnosis not present

## 2014-10-21 DIAGNOSIS — S51811A Laceration without foreign body of right forearm, initial encounter: Secondary | ICD-10-CM | POA: Diagnosis not present

## 2014-10-21 DIAGNOSIS — Z9889 Other specified postprocedural states: Secondary | ICD-10-CM | POA: Diagnosis not present

## 2014-10-21 DIAGNOSIS — S41111A Laceration without foreign body of right upper arm, initial encounter: Secondary | ICD-10-CM | POA: Diagnosis not present

## 2014-10-21 DIAGNOSIS — M25562 Pain in left knee: Secondary | ICD-10-CM | POA: Diagnosis not present

## 2014-10-21 DIAGNOSIS — M25561 Pain in right knee: Secondary | ICD-10-CM | POA: Diagnosis not present

## 2014-10-21 DIAGNOSIS — R58 Hemorrhage, not elsewhere classified: Secondary | ICD-10-CM | POA: Diagnosis not present

## 2014-10-21 DIAGNOSIS — S51801A Unspecified open wound of right forearm, initial encounter: Secondary | ICD-10-CM | POA: Diagnosis not present

## 2014-10-30 DIAGNOSIS — Z4802 Encounter for removal of sutures: Secondary | ICD-10-CM | POA: Diagnosis not present

## 2014-10-30 DIAGNOSIS — S40021D Contusion of right upper arm, subsequent encounter: Secondary | ICD-10-CM | POA: Diagnosis not present

## 2014-11-07 ENCOUNTER — Other Ambulatory Visit: Payer: Self-pay | Admitting: Interventional Cardiology

## 2014-11-12 ENCOUNTER — Ambulatory Visit (INDEPENDENT_AMBULATORY_CARE_PROVIDER_SITE_OTHER): Payer: Medicare Other | Admitting: Interventional Cardiology

## 2014-11-12 ENCOUNTER — Encounter: Payer: Self-pay | Admitting: Interventional Cardiology

## 2014-11-12 VITALS — BP 134/90 | HR 64 | Ht 67.0 in | Wt 200.0 lb

## 2014-11-12 DIAGNOSIS — I480 Paroxysmal atrial fibrillation: Secondary | ICD-10-CM | POA: Diagnosis not present

## 2014-11-12 DIAGNOSIS — I10 Essential (primary) hypertension: Secondary | ICD-10-CM

## 2014-11-12 DIAGNOSIS — E785 Hyperlipidemia, unspecified: Secondary | ICD-10-CM | POA: Diagnosis not present

## 2014-11-12 DIAGNOSIS — Z7901 Long term (current) use of anticoagulants: Secondary | ICD-10-CM | POA: Diagnosis not present

## 2014-11-12 DIAGNOSIS — Z5189 Encounter for other specified aftercare: Secondary | ICD-10-CM | POA: Diagnosis not present

## 2014-11-12 MED ORDER — POTASSIUM CHLORIDE CRYS ER 20 MEQ PO TBCR: 20.0000 meq | EXTENDED_RELEASE_TABLET | Freq: Every day | ORAL | Status: DC

## 2014-11-12 NOTE — Patient Instructions (Signed)
Medication Instructions:  Your physician recommends that you continue on your current medications as directed. Please refer to the Current Medication list given to you today.  Labwork: No new orders.  Testing/Procedures: No new orders  Follow-Up: Your physician wants you to follow-up in: 1 YEAR with Dr Tamala Julian.  You will receive a reminder letter in the mail two months in advance. If you don't receive a letter, please call our office to schedule the follow-up appointment.  Any Other Special Instructions Will Be Listed Below (If Applicable).

## 2014-11-12 NOTE — Progress Notes (Signed)
Cardiology Office Note   Date:  11/12/2014   ID:  [name], DOB 10/05/1940, MRN [id number]  PCP:  Mathews Argyle, MD  Cardiologist:   Sinclair Grooms, MD   Chief Complaint  Patient presents with  . Atrial Fibrillation      History of Present Illness: [name] is a 74 y.o. female who presents for chronic atrial fibrillation, hypertension, chronic anticoagulation with Eliquis.  Since I last office visit, one year ago she has done well from a cardiac viewpoint. It appears me that her cognitive function/memory is planning. She has not had dyspnea or lower extremity swelling. She denies angina and has no history of coronary disease. There've been no episodes of syncope or head trauma. She did fall out of bed and severely lacerated her right arm requiring stitches. He cut she is on Eliquis, bleeding was excessive. This occurred while on vacation. Otherwise she is doing well.    Past Medical History  Diagnosis Date  . Hypertension   . Hyperlipidemia   . Cancer     breast cancer  . Anal stenosis   . Dysrhythmia     atrial fibrilation  . Atrial fibrillation   . History of cardioversion     02/04/2012  . Sleep apnea     cpap---DON'T KNOW SETTINGS  . Arthritis     rheumatoid  . Depression   . Hypothyroidism   . OAB (overactive bladder)     Past Surgical History  Procedure Laterality Date  . Anal dilation      multiple  . Abdominal hysterectomy    . Incision and drainage perirectal abscess  11/01/2011    Procedure: IRRIGATION AND DEBRIDEMENT PERIRECTAL ABSCESS;  Surgeon: Shann Medal, MD;  Location: [place];  Service: General;  Laterality: N/A;  Rigid Sigmiodoscopy ; Irrigation and Debridement left perineum and labia.  . Breast surgery      right mastectomy; left lumpectomy  . Joint replacement      right knee  . Colon surgery  1986  . Cardioversion  02/04/2012    Procedure: CARDIOVERSION;  Surgeon: Sinclair Grooms, MD;  Location: [place];  Service:  Cardiovascular;  Laterality: N/A;  . Total knee arthroplasty Left 10/03/2012    Procedure: LEFT TOTAL KNEE ARTHROPLASTY;  Surgeon: Yvette Rack., MD;  Location: [place];  Service: Orthopedics;  Laterality: Left;     Current Outpatient Prescriptions  Medication Sig Dispense Refill  . acetaminophen (TYLENOL) 325 MG tablet Take 975 mg by mouth every 6 (six) hours as needed for mild pain or moderate pain.     Marland Kitchen atorvastatin (LIPITOR) 40 MG tablet Take 40 mg by mouth daily.    . calcium carbonate (TUMS - DOSED IN MG ELEMENTAL CALCIUM) 500 MG chewable tablet Chew 1 tablet by mouth daily.    Marland Kitchen diltiazem (CARDIZEM CD) 180 MG 24 hr capsule TAKE 1 CAPSULE DAILY 90 capsule 0  . docusate sodium (COLACE) 100 MG capsule Take 300 mg by mouth at bedtime.     Marland Kitchen ELIQUIS 5 MG TABS tablet TAKE 1 TABLET TWICE A DAY 180 tablet 1  . furosemide (LASIX) 20 MG tablet Take 20 mg by mouth daily.    Marland Kitchen levothyroxine (SYNTHROID, LEVOTHROID) 75 MCG tablet Take 75 mcg by mouth daily before breakfast.    . Multiple Vitamin (MULITIVITAMIN WITH MINERALS) TABS Take 1 tablet by mouth daily.    Marland Kitchen oxybutynin (DITROPAN-XL) 10 MG 24 hr tablet Take 10 mg by mouth  at bedtime.    Marland Kitchen PARoxetine (PAXIL) 40 MG tablet Take 40 mg by mouth every morning.    . polyethylene glycol (MIRALAX / GLYCOLAX) packet Take 17 g by mouth daily as needed (for bowel movement).     . potassium chloride SA (K-DUR,KLOR-CON) 20 MEQ tablet Take 1 tablet (20 mEq total) by mouth daily. 90 tablet 3  . solifenacin (VESICARE) 10 MG tablet Take 10 mg by mouth daily as needed. Urinary frequency     No current facility-administered medications for this visit.    Allergies:   Benazepril and Diclofenac    Social History:  The patient  reports that she has never smoked. She has never used smokeless tobacco. She reports that she drinks about 8.4 oz of alcohol per week. She reports that she does not use illicit drugs.   Family History:  The patient's family history  includes Cancer in her maternal aunt and mother; Heart disease in her father.    ROS:  Please see the history of present illness.   Otherwise, review of systems are positive for fall with laceration of the right arm. No head trauma. Decreased hearing. Fatigue. Easy bruisability. .   All other systems are reviewed and negative.    PHYSICAL EXAM: VS:  BP 134/90 mmHg  Pulse 64  Ht 5\' 7"  (1.702 m)  Wt 200 lb (90.719 kg)  BMI 31.32 kg/m2 , BMI Body mass index is 31.32 kg/(m^2). GEN: Well nourished, well developed, in no acute distress HEENT: normal Neck: no JVD, carotid bruits, or masses Cardiac: IIRR; no murmurs, rubs, or gallops,no edema  Respiratory:  clear to auscultation bilaterally, normal work of breathing GI: soft, nontender, nondistended, + BS MS: no deformity or atrophy Skin: warm and dry, no rash Neuro:  Strength and sensation are intact Psych: euthymic mood, full affect   EKG:  EKG is ordered today. The ekg ordered today demonstrates atrial fibrillation with controlled response, and otherwise unremarkable   Recent Labs: No results found for requested labs within last 365 days.    Lipid Panel No results found for: CHOL, TRIG, HDL, CHOLHDL, VLDL, LDLCALC, LDLDIRECT    Wt Readings from Last 3 Encounters:  11/12/14 200 lb (90.719 kg)  05/27/14 190 lb (86.183 kg)  04/08/14 184 lb 9.6 oz (83.734 kg)      Other studies Reviewed: Additional studies/ records that were reviewed today include: None.   ASSESSMENT AND PLAN:  Long term current use of anticoagulant therapy - stable on Eliquis  Essential hypertension - potassium chloride SA (K-DUR,KLOR-CON) 20 MEQ tablet will be continued. The blood pressure is stable.  Hyperlipidemia - followed by primary care  PAF (paroxysmal atrial fibrillation) - controlled rate    Current medicines are reviewed at length with the patient today.  The patient does not have concerns regarding medicines.  The following changes  have been made:  no change  Labs/ tests ordered today include:   Orders Placed This Encounter  Procedures  . EKG 12-Lead     Disposition:   FU with Linard Millers in 1 year   Signed, Sinclair Grooms, MD  11/12/2014 12:12 PM    [address], [place], [place]  [address] Phone: [phone]; Fax: [phone]

## 2014-11-23 DIAGNOSIS — Z6831 Body mass index (BMI) 31.0-31.9, adult: Secondary | ICD-10-CM | POA: Diagnosis not present

## 2014-11-23 DIAGNOSIS — Z23 Encounter for immunization: Secondary | ICD-10-CM | POA: Diagnosis not present

## 2014-11-23 DIAGNOSIS — I480 Paroxysmal atrial fibrillation: Secondary | ICD-10-CM | POA: Diagnosis not present

## 2014-11-23 DIAGNOSIS — E039 Hypothyroidism, unspecified: Secondary | ICD-10-CM | POA: Diagnosis not present

## 2014-11-23 DIAGNOSIS — Z1389 Encounter for screening for other disorder: Secondary | ICD-10-CM | POA: Diagnosis not present

## 2014-11-23 DIAGNOSIS — Z79899 Other long term (current) drug therapy: Secondary | ICD-10-CM | POA: Diagnosis not present

## 2014-11-23 DIAGNOSIS — E669 Obesity, unspecified: Secondary | ICD-10-CM | POA: Diagnosis not present

## 2014-11-23 DIAGNOSIS — E780 Pure hypercholesterolemia: Secondary | ICD-10-CM | POA: Diagnosis not present

## 2014-11-23 DIAGNOSIS — F3342 Major depressive disorder, recurrent, in full remission: Secondary | ICD-10-CM | POA: Diagnosis not present

## 2014-11-23 DIAGNOSIS — Z0000 Encounter for general adult medical examination without abnormal findings: Secondary | ICD-10-CM | POA: Diagnosis not present

## 2014-11-23 DIAGNOSIS — I10 Essential (primary) hypertension: Secondary | ICD-10-CM | POA: Diagnosis not present

## 2014-12-01 DIAGNOSIS — Z471 Aftercare following joint replacement surgery: Secondary | ICD-10-CM | POA: Diagnosis not present

## 2014-12-01 DIAGNOSIS — T8484XD Pain due to internal orthopedic prosthetic devices, implants and grafts, subsequent encounter: Secondary | ICD-10-CM | POA: Diagnosis not present

## 2014-12-01 DIAGNOSIS — Z96642 Presence of left artificial hip joint: Secondary | ICD-10-CM | POA: Diagnosis not present

## 2014-12-01 DIAGNOSIS — Z96652 Presence of left artificial knee joint: Secondary | ICD-10-CM | POA: Diagnosis not present

## 2014-12-11 ENCOUNTER — Other Ambulatory Visit: Payer: Self-pay | Admitting: Interventional Cardiology

## 2015-01-26 DIAGNOSIS — M25562 Pain in left knee: Secondary | ICD-10-CM | POA: Diagnosis not present

## 2015-01-26 DIAGNOSIS — M25561 Pain in right knee: Secondary | ICD-10-CM | POA: Diagnosis not present

## 2015-01-26 DIAGNOSIS — G4733 Obstructive sleep apnea (adult) (pediatric): Secondary | ICD-10-CM | POA: Diagnosis not present

## 2015-01-26 DIAGNOSIS — R269 Unspecified abnormalities of gait and mobility: Secondary | ICD-10-CM | POA: Diagnosis not present

## 2015-01-26 DIAGNOSIS — G6289 Other specified polyneuropathies: Secondary | ICD-10-CM | POA: Diagnosis not present

## 2015-01-26 DIAGNOSIS — G8929 Other chronic pain: Secondary | ICD-10-CM | POA: Diagnosis not present

## 2015-02-02 DIAGNOSIS — T8484XD Pain due to internal orthopedic prosthetic devices, implants and grafts, subsequent encounter: Secondary | ICD-10-CM | POA: Diagnosis not present

## 2015-02-02 DIAGNOSIS — M25512 Pain in left shoulder: Secondary | ICD-10-CM | POA: Diagnosis not present

## 2015-02-02 DIAGNOSIS — Z471 Aftercare following joint replacement surgery: Secondary | ICD-10-CM | POA: Diagnosis not present

## 2015-02-02 DIAGNOSIS — Z96652 Presence of left artificial knee joint: Secondary | ICD-10-CM | POA: Diagnosis not present

## 2015-02-02 DIAGNOSIS — Z96642 Presence of left artificial hip joint: Secondary | ICD-10-CM | POA: Diagnosis not present

## 2015-02-05 ENCOUNTER — Other Ambulatory Visit: Payer: Self-pay | Admitting: Interventional Cardiology

## 2015-02-14 DIAGNOSIS — E780 Pure hypercholesterolemia: Secondary | ICD-10-CM | POA: Diagnosis not present

## 2015-02-14 DIAGNOSIS — G4730 Sleep apnea, unspecified: Secondary | ICD-10-CM | POA: Diagnosis not present

## 2015-02-14 DIAGNOSIS — I480 Paroxysmal atrial fibrillation: Secondary | ICD-10-CM | POA: Diagnosis not present

## 2015-02-14 DIAGNOSIS — Z6831 Body mass index (BMI) 31.0-31.9, adult: Secondary | ICD-10-CM | POA: Diagnosis not present

## 2015-02-14 DIAGNOSIS — E669 Obesity, unspecified: Secondary | ICD-10-CM | POA: Diagnosis not present

## 2015-02-14 DIAGNOSIS — I10 Essential (primary) hypertension: Secondary | ICD-10-CM | POA: Diagnosis not present

## 2015-02-21 DIAGNOSIS — E039 Hypothyroidism, unspecified: Secondary | ICD-10-CM | POA: Diagnosis not present

## 2015-04-16 DIAGNOSIS — J9811 Atelectasis: Secondary | ICD-10-CM | POA: Diagnosis not present

## 2015-04-16 DIAGNOSIS — E876 Hypokalemia: Secondary | ICD-10-CM | POA: Diagnosis present

## 2015-04-16 DIAGNOSIS — I5020 Unspecified systolic (congestive) heart failure: Secondary | ICD-10-CM | POA: Diagnosis not present

## 2015-04-16 DIAGNOSIS — M5126 Other intervertebral disc displacement, lumbar region: Secondary | ICD-10-CM | POA: Diagnosis not present

## 2015-04-16 DIAGNOSIS — M25461 Effusion, right knee: Secondary | ICD-10-CM | POA: Diagnosis not present

## 2015-04-16 DIAGNOSIS — R269 Unspecified abnormalities of gait and mobility: Secondary | ICD-10-CM | POA: Diagnosis not present

## 2015-04-16 DIAGNOSIS — R918 Other nonspecific abnormal finding of lung field: Secondary | ICD-10-CM | POA: Diagnosis not present

## 2015-04-16 DIAGNOSIS — Z96642 Presence of left artificial hip joint: Secondary | ICD-10-CM | POA: Diagnosis present

## 2015-04-16 DIAGNOSIS — Z041 Encounter for examination and observation following transport accident: Secondary | ICD-10-CM | POA: Diagnosis not present

## 2015-04-16 DIAGNOSIS — F329 Major depressive disorder, single episode, unspecified: Secondary | ICD-10-CM | POA: Diagnosis present

## 2015-04-16 DIAGNOSIS — J90 Pleural effusion, not elsewhere classified: Secondary | ICD-10-CM | POA: Diagnosis not present

## 2015-04-16 DIAGNOSIS — Z96652 Presence of left artificial knee joint: Secondary | ICD-10-CM | POA: Diagnosis present

## 2015-04-16 DIAGNOSIS — Z853 Personal history of malignant neoplasm of breast: Secondary | ICD-10-CM | POA: Diagnosis not present

## 2015-04-16 DIAGNOSIS — K6389 Other specified diseases of intestine: Secondary | ICD-10-CM | POA: Diagnosis not present

## 2015-04-16 DIAGNOSIS — Y33XXXA Other specified events, undetermined intent, initial encounter: Secondary | ICD-10-CM | POA: Diagnosis not present

## 2015-04-16 DIAGNOSIS — S31113A Laceration without foreign body of abdominal wall, right lower quadrant without penetration into peritoneal cavity, initial encounter: Secondary | ICD-10-CM | POA: Diagnosis present

## 2015-04-16 DIAGNOSIS — Z043 Encounter for examination and observation following other accident: Secondary | ICD-10-CM | POA: Diagnosis not present

## 2015-04-16 DIAGNOSIS — S2243XA Multiple fractures of ribs, bilateral, initial encounter for closed fracture: Secondary | ICD-10-CM | POA: Diagnosis not present

## 2015-04-16 DIAGNOSIS — S31114A Laceration without foreign body of abdominal wall, left lower quadrant without penetration into peritoneal cavity, initial encounter: Secondary | ICD-10-CM | POA: Diagnosis present

## 2015-04-16 DIAGNOSIS — S8251XA Displaced fracture of medial malleolus of right tibia, initial encounter for closed fracture: Secondary | ICD-10-CM | POA: Diagnosis not present

## 2015-04-16 DIAGNOSIS — I10 Essential (primary) hypertension: Secondary | ICD-10-CM | POA: Diagnosis not present

## 2015-04-16 DIAGNOSIS — E039 Hypothyroidism, unspecified: Secondary | ICD-10-CM | POA: Diagnosis present

## 2015-04-16 DIAGNOSIS — S2243XD Multiple fractures of ribs, bilateral, subsequent encounter for fracture with routine healing: Secondary | ICD-10-CM | POA: Diagnosis not present

## 2015-04-16 DIAGNOSIS — M6281 Muscle weakness (generalized): Secondary | ICD-10-CM | POA: Diagnosis not present

## 2015-04-16 DIAGNOSIS — I4891 Unspecified atrial fibrillation: Secondary | ICD-10-CM | POA: Diagnosis not present

## 2015-04-16 DIAGNOSIS — N3281 Overactive bladder: Secondary | ICD-10-CM | POA: Diagnosis present

## 2015-04-16 DIAGNOSIS — G8911 Acute pain due to trauma: Secondary | ICD-10-CM | POA: Diagnosis not present

## 2015-04-16 DIAGNOSIS — I2510 Atherosclerotic heart disease of native coronary artery without angina pectoris: Secondary | ICD-10-CM | POA: Diagnosis present

## 2015-04-16 DIAGNOSIS — M069 Rheumatoid arthritis, unspecified: Secondary | ICD-10-CM | POA: Diagnosis present

## 2015-04-16 DIAGNOSIS — S8264XD Nondisplaced fracture of lateral malleolus of right fibula, subsequent encounter for closed fracture with routine healing: Secondary | ICD-10-CM | POA: Diagnosis not present

## 2015-04-16 DIAGNOSIS — E785 Hyperlipidemia, unspecified: Secondary | ICD-10-CM | POA: Diagnosis present

## 2015-04-16 DIAGNOSIS — R079 Chest pain, unspecified: Secondary | ICD-10-CM | POA: Diagnosis not present

## 2015-04-22 DIAGNOSIS — I10 Essential (primary) hypertension: Secondary | ICD-10-CM | POA: Diagnosis not present

## 2015-04-22 DIAGNOSIS — S8264XD Nondisplaced fracture of lateral malleolus of right fibula, subsequent encounter for closed fracture with routine healing: Secondary | ICD-10-CM | POA: Diagnosis not present

## 2015-04-22 DIAGNOSIS — M6281 Muscle weakness (generalized): Secondary | ICD-10-CM | POA: Diagnosis not present

## 2015-04-22 DIAGNOSIS — I4891 Unspecified atrial fibrillation: Secondary | ICD-10-CM | POA: Diagnosis not present

## 2015-04-22 DIAGNOSIS — M25561 Pain in right knee: Secondary | ICD-10-CM | POA: Diagnosis not present

## 2015-04-22 DIAGNOSIS — I482 Chronic atrial fibrillation: Secondary | ICD-10-CM | POA: Diagnosis not present

## 2015-04-22 DIAGNOSIS — E039 Hypothyroidism, unspecified: Secondary | ICD-10-CM | POA: Diagnosis not present

## 2015-04-22 DIAGNOSIS — R262 Difficulty in walking, not elsewhere classified: Secondary | ICD-10-CM | POA: Diagnosis not present

## 2015-04-22 DIAGNOSIS — R5381 Other malaise: Secondary | ICD-10-CM | POA: Diagnosis not present

## 2015-04-22 DIAGNOSIS — E785 Hyperlipidemia, unspecified: Secondary | ICD-10-CM | POA: Diagnosis not present

## 2015-04-22 DIAGNOSIS — M069 Rheumatoid arthritis, unspecified: Secondary | ICD-10-CM | POA: Diagnosis not present

## 2015-04-22 DIAGNOSIS — I5020 Unspecified systolic (congestive) heart failure: Secondary | ICD-10-CM | POA: Diagnosis not present

## 2015-04-22 DIAGNOSIS — S2243XD Multiple fractures of ribs, bilateral, subsequent encounter for fracture with routine healing: Secondary | ICD-10-CM | POA: Diagnosis not present

## 2015-04-22 DIAGNOSIS — L03314 Cellulitis of groin: Secondary | ICD-10-CM | POA: Diagnosis not present

## 2015-04-22 DIAGNOSIS — R269 Unspecified abnormalities of gait and mobility: Secondary | ICD-10-CM | POA: Diagnosis not present

## 2015-04-25 DIAGNOSIS — L03314 Cellulitis of groin: Secondary | ICD-10-CM | POA: Diagnosis not present

## 2015-04-25 DIAGNOSIS — I482 Chronic atrial fibrillation: Secondary | ICD-10-CM | POA: Diagnosis not present

## 2015-04-25 DIAGNOSIS — I10 Essential (primary) hypertension: Secondary | ICD-10-CM | POA: Diagnosis not present

## 2015-04-25 DIAGNOSIS — E039 Hypothyroidism, unspecified: Secondary | ICD-10-CM | POA: Diagnosis not present

## 2015-04-29 DIAGNOSIS — M25561 Pain in right knee: Secondary | ICD-10-CM | POA: Diagnosis not present

## 2015-04-29 DIAGNOSIS — R262 Difficulty in walking, not elsewhere classified: Secondary | ICD-10-CM | POA: Diagnosis not present

## 2015-04-29 DIAGNOSIS — M6281 Muscle weakness (generalized): Secondary | ICD-10-CM | POA: Diagnosis not present

## 2015-04-29 DIAGNOSIS — R5381 Other malaise: Secondary | ICD-10-CM | POA: Diagnosis not present

## 2015-05-02 DIAGNOSIS — L03314 Cellulitis of groin: Secondary | ICD-10-CM | POA: Diagnosis not present

## 2015-05-02 DIAGNOSIS — I482 Chronic atrial fibrillation: Secondary | ICD-10-CM | POA: Diagnosis not present

## 2015-05-02 DIAGNOSIS — E039 Hypothyroidism, unspecified: Secondary | ICD-10-CM | POA: Diagnosis not present

## 2015-05-02 DIAGNOSIS — I10 Essential (primary) hypertension: Secondary | ICD-10-CM | POA: Diagnosis not present

## 2015-05-10 DIAGNOSIS — S8251XD Displaced fracture of medial malleolus of right tibia, subsequent encounter for closed fracture with routine healing: Secondary | ICD-10-CM | POA: Diagnosis not present

## 2015-05-10 DIAGNOSIS — I519 Heart disease, unspecified: Secondary | ICD-10-CM | POA: Diagnosis not present

## 2015-05-10 DIAGNOSIS — I10 Essential (primary) hypertension: Secondary | ICD-10-CM | POA: Diagnosis not present

## 2015-05-10 DIAGNOSIS — M069 Rheumatoid arthritis, unspecified: Secondary | ICD-10-CM | POA: Diagnosis not present

## 2015-05-10 DIAGNOSIS — S2243XD Multiple fractures of ribs, bilateral, subsequent encounter for fracture with routine healing: Secondary | ICD-10-CM | POA: Diagnosis not present

## 2015-05-10 DIAGNOSIS — I4891 Unspecified atrial fibrillation: Secondary | ICD-10-CM | POA: Diagnosis not present

## 2015-05-13 DIAGNOSIS — S82891D Other fracture of right lower leg, subsequent encounter for closed fracture with routine healing: Secondary | ICD-10-CM | POA: Diagnosis not present

## 2015-05-13 DIAGNOSIS — I10 Essential (primary) hypertension: Secondary | ICD-10-CM | POA: Diagnosis not present

## 2015-05-16 DIAGNOSIS — M069 Rheumatoid arthritis, unspecified: Secondary | ICD-10-CM | POA: Diagnosis not present

## 2015-05-16 DIAGNOSIS — S8251XD Displaced fracture of medial malleolus of right tibia, subsequent encounter for closed fracture with routine healing: Secondary | ICD-10-CM | POA: Diagnosis not present

## 2015-05-16 DIAGNOSIS — I4891 Unspecified atrial fibrillation: Secondary | ICD-10-CM | POA: Diagnosis not present

## 2015-05-16 DIAGNOSIS — I519 Heart disease, unspecified: Secondary | ICD-10-CM | POA: Diagnosis not present

## 2015-05-16 DIAGNOSIS — S2243XD Multiple fractures of ribs, bilateral, subsequent encounter for fracture with routine healing: Secondary | ICD-10-CM | POA: Diagnosis not present

## 2015-05-16 DIAGNOSIS — I10 Essential (primary) hypertension: Secondary | ICD-10-CM | POA: Diagnosis not present

## 2015-05-18 DIAGNOSIS — I519 Heart disease, unspecified: Secondary | ICD-10-CM | POA: Diagnosis not present

## 2015-05-18 DIAGNOSIS — I4891 Unspecified atrial fibrillation: Secondary | ICD-10-CM | POA: Diagnosis not present

## 2015-05-18 DIAGNOSIS — S8251XD Displaced fracture of medial malleolus of right tibia, subsequent encounter for closed fracture with routine healing: Secondary | ICD-10-CM | POA: Diagnosis not present

## 2015-05-18 DIAGNOSIS — M069 Rheumatoid arthritis, unspecified: Secondary | ICD-10-CM | POA: Diagnosis not present

## 2015-05-18 DIAGNOSIS — M25571 Pain in right ankle and joints of right foot: Secondary | ICD-10-CM | POA: Diagnosis not present

## 2015-05-18 DIAGNOSIS — S2243XD Multiple fractures of ribs, bilateral, subsequent encounter for fracture with routine healing: Secondary | ICD-10-CM | POA: Diagnosis not present

## 2015-05-18 DIAGNOSIS — I10 Essential (primary) hypertension: Secondary | ICD-10-CM | POA: Diagnosis not present

## 2015-05-23 DIAGNOSIS — S2243XD Multiple fractures of ribs, bilateral, subsequent encounter for fracture with routine healing: Secondary | ICD-10-CM | POA: Diagnosis not present

## 2015-05-23 DIAGNOSIS — M069 Rheumatoid arthritis, unspecified: Secondary | ICD-10-CM | POA: Diagnosis not present

## 2015-05-23 DIAGNOSIS — I4891 Unspecified atrial fibrillation: Secondary | ICD-10-CM | POA: Diagnosis not present

## 2015-05-23 DIAGNOSIS — I10 Essential (primary) hypertension: Secondary | ICD-10-CM | POA: Diagnosis not present

## 2015-05-23 DIAGNOSIS — S8251XD Displaced fracture of medial malleolus of right tibia, subsequent encounter for closed fracture with routine healing: Secondary | ICD-10-CM | POA: Diagnosis not present

## 2015-05-23 DIAGNOSIS — I519 Heart disease, unspecified: Secondary | ICD-10-CM | POA: Diagnosis not present

## 2015-05-24 DIAGNOSIS — I480 Paroxysmal atrial fibrillation: Secondary | ICD-10-CM | POA: Diagnosis not present

## 2015-05-24 DIAGNOSIS — I10 Essential (primary) hypertension: Secondary | ICD-10-CM | POA: Diagnosis not present

## 2015-05-24 DIAGNOSIS — E039 Hypothyroidism, unspecified: Secondary | ICD-10-CM | POA: Diagnosis not present

## 2015-05-25 DIAGNOSIS — M069 Rheumatoid arthritis, unspecified: Secondary | ICD-10-CM | POA: Diagnosis not present

## 2015-05-25 DIAGNOSIS — I4891 Unspecified atrial fibrillation: Secondary | ICD-10-CM | POA: Diagnosis not present

## 2015-05-25 DIAGNOSIS — I519 Heart disease, unspecified: Secondary | ICD-10-CM | POA: Diagnosis not present

## 2015-05-25 DIAGNOSIS — S2243XD Multiple fractures of ribs, bilateral, subsequent encounter for fracture with routine healing: Secondary | ICD-10-CM | POA: Diagnosis not present

## 2015-05-25 DIAGNOSIS — S8251XD Displaced fracture of medial malleolus of right tibia, subsequent encounter for closed fracture with routine healing: Secondary | ICD-10-CM | POA: Diagnosis not present

## 2015-05-25 DIAGNOSIS — I10 Essential (primary) hypertension: Secondary | ICD-10-CM | POA: Diagnosis not present

## 2015-05-31 DIAGNOSIS — I10 Essential (primary) hypertension: Secondary | ICD-10-CM | POA: Diagnosis not present

## 2015-05-31 DIAGNOSIS — I519 Heart disease, unspecified: Secondary | ICD-10-CM | POA: Diagnosis not present

## 2015-05-31 DIAGNOSIS — S2243XD Multiple fractures of ribs, bilateral, subsequent encounter for fracture with routine healing: Secondary | ICD-10-CM | POA: Diagnosis not present

## 2015-05-31 DIAGNOSIS — I4891 Unspecified atrial fibrillation: Secondary | ICD-10-CM | POA: Diagnosis not present

## 2015-05-31 DIAGNOSIS — M069 Rheumatoid arthritis, unspecified: Secondary | ICD-10-CM | POA: Diagnosis not present

## 2015-05-31 DIAGNOSIS — S8251XD Displaced fracture of medial malleolus of right tibia, subsequent encounter for closed fracture with routine healing: Secondary | ICD-10-CM | POA: Diagnosis not present

## 2015-06-01 DIAGNOSIS — I519 Heart disease, unspecified: Secondary | ICD-10-CM | POA: Diagnosis not present

## 2015-06-01 DIAGNOSIS — S8251XD Displaced fracture of medial malleolus of right tibia, subsequent encounter for closed fracture with routine healing: Secondary | ICD-10-CM | POA: Diagnosis not present

## 2015-06-01 DIAGNOSIS — M069 Rheumatoid arthritis, unspecified: Secondary | ICD-10-CM | POA: Diagnosis not present

## 2015-06-01 DIAGNOSIS — I10 Essential (primary) hypertension: Secondary | ICD-10-CM | POA: Diagnosis not present

## 2015-06-01 DIAGNOSIS — I4891 Unspecified atrial fibrillation: Secondary | ICD-10-CM | POA: Diagnosis not present

## 2015-06-01 DIAGNOSIS — S2243XD Multiple fractures of ribs, bilateral, subsequent encounter for fracture with routine healing: Secondary | ICD-10-CM | POA: Diagnosis not present

## 2015-06-06 DIAGNOSIS — S8251XD Displaced fracture of medial malleolus of right tibia, subsequent encounter for closed fracture with routine healing: Secondary | ICD-10-CM | POA: Diagnosis not present

## 2015-06-06 DIAGNOSIS — M069 Rheumatoid arthritis, unspecified: Secondary | ICD-10-CM | POA: Diagnosis not present

## 2015-06-06 DIAGNOSIS — I4891 Unspecified atrial fibrillation: Secondary | ICD-10-CM | POA: Diagnosis not present

## 2015-06-06 DIAGNOSIS — I10 Essential (primary) hypertension: Secondary | ICD-10-CM | POA: Diagnosis not present

## 2015-06-06 DIAGNOSIS — S2243XD Multiple fractures of ribs, bilateral, subsequent encounter for fracture with routine healing: Secondary | ICD-10-CM | POA: Diagnosis not present

## 2015-06-06 DIAGNOSIS — I519 Heart disease, unspecified: Secondary | ICD-10-CM | POA: Diagnosis not present

## 2015-06-09 DIAGNOSIS — I10 Essential (primary) hypertension: Secondary | ICD-10-CM | POA: Diagnosis not present

## 2015-06-09 DIAGNOSIS — I4891 Unspecified atrial fibrillation: Secondary | ICD-10-CM | POA: Diagnosis not present

## 2015-06-09 DIAGNOSIS — M069 Rheumatoid arthritis, unspecified: Secondary | ICD-10-CM | POA: Diagnosis not present

## 2015-06-09 DIAGNOSIS — S8251XD Displaced fracture of medial malleolus of right tibia, subsequent encounter for closed fracture with routine healing: Secondary | ICD-10-CM | POA: Diagnosis not present

## 2015-06-09 DIAGNOSIS — S2243XD Multiple fractures of ribs, bilateral, subsequent encounter for fracture with routine healing: Secondary | ICD-10-CM | POA: Diagnosis not present

## 2015-06-09 DIAGNOSIS — I519 Heart disease, unspecified: Secondary | ICD-10-CM | POA: Diagnosis not present

## 2015-06-10 ENCOUNTER — Other Ambulatory Visit: Payer: Self-pay | Admitting: Interventional Cardiology

## 2015-06-13 DIAGNOSIS — Z961 Presence of intraocular lens: Secondary | ICD-10-CM | POA: Diagnosis not present

## 2015-06-13 DIAGNOSIS — D100 Benign neoplasm of lip: Secondary | ICD-10-CM | POA: Diagnosis not present

## 2015-06-14 DIAGNOSIS — Z1231 Encounter for screening mammogram for malignant neoplasm of breast: Secondary | ICD-10-CM | POA: Diagnosis not present

## 2015-10-15 DIAGNOSIS — M25531 Pain in right wrist: Secondary | ICD-10-CM | POA: Diagnosis not present

## 2015-10-18 DIAGNOSIS — M25531 Pain in right wrist: Secondary | ICD-10-CM | POA: Diagnosis not present

## 2015-10-23 ENCOUNTER — Other Ambulatory Visit: Payer: Self-pay | Admitting: Interventional Cardiology

## 2015-10-31 ENCOUNTER — Encounter: Payer: Self-pay | Admitting: Cardiology

## 2015-10-31 ENCOUNTER — Ambulatory Visit (INDEPENDENT_AMBULATORY_CARE_PROVIDER_SITE_OTHER): Payer: Medicare Other | Admitting: Cardiology

## 2015-10-31 VITALS — BP 132/62 | HR 79 | Ht 67.0 in | Wt 202.0 lb

## 2015-10-31 DIAGNOSIS — I340 Nonrheumatic mitral (valve) insufficiency: Secondary | ICD-10-CM

## 2015-10-31 DIAGNOSIS — I480 Paroxysmal atrial fibrillation: Secondary | ICD-10-CM | POA: Diagnosis not present

## 2015-10-31 NOTE — Patient Instructions (Addendum)
Medication Instructions: CONTINUE TO TAKE YOUR MEDICATIONS AS DIRECTED   Labwork: NONE  Testing/Procedures: Your physician has requested that you have an echocardiogram. Echocardiography is a painless test that uses sound waves to create images of your heart. It provides your doctor with information about the size and shape of your heart and how well your heart's chambers and valves are working. This procedure takes approximately one hour. There are no restrictions for this procedure.   Follow-Up: Your physician recommends that you schedule a follow-up appointment in [place]   Any Other Special Instructions Will Be Listed Below (If Applicable).     If you need a refill on your cardiac medications before your next appointment, please call your pharmacy.

## 2015-10-31 NOTE — Progress Notes (Signed)
10/31/2015 [name]   1941/05/10  [id number]  Primary Physician Mathews Argyle, MD Primary Cardiologist: Dr. Tamala Julian   Reason for Visit/CC: Routine F/u for Chronic Atrial Fibrillation and HTN  HPI:  The patient is a 75 y/o female, followed by Dr. Tamala Julian, who presents today for routien f/u. She has a h/o chronic atrial fibrillation and HTN. She is on chronic anticoagulation with Eliquis. She has no h/o CAD. Her last echo in 2014 showed normal LVEF of 55-60% and mild MR. She was last seen by Dr. Tamala Julian 1 year ago on 11/12/14. She was felt to be stable from a cardiac standpoint and instructed to f/u in 1 year.   Today in clinic, she reports that she has done fairly well. She denies any palpitations or chest pain but does note mild DOE over the last 3 months. Symptoms have not worsened. She notes worsening LE orthopaedic problems including OA, which she feels may be contributing to her dyspnea. She now walks with a walker. She denies any resting dyspnea. No LEE, orthopnea or PND. She reports full medication compliance. No abnormal bleeding on Eliquis.   EKG today shows atrial fibrillation with a CVR of 69 bpm. BP is controlled at 132/62.    Current Outpatient Prescriptions  Medication Sig Dispense Refill  . acetaminophen (TYLENOL) 325 MG tablet Take 975 mg by mouth every 6 (six) hours as needed for mild pain or moderate pain.     Marland Kitchen apixaban (ELIQUIS) 5 MG TABS tablet Take 1 tablet (5 mg total) by mouth 2 (two) times daily. 180 tablet 1  . atorvastatin (LIPITOR) 40 MG tablet Take 40 mg by mouth daily.    . calcium carbonate (TUMS - DOSED IN MG ELEMENTAL CALCIUM) 500 MG chewable tablet Chew 1 tablet by mouth daily.    Marland Kitchen diltiazem (CARDIZEM CD) 180 MG 24 hr capsule TAKE 1 CAPSULE DAILY 90 capsule 2  . docusate sodium (COLACE) 100 MG capsule Take 300 mg by mouth at bedtime.     . furosemide (LASIX) 20 MG tablet Take 20 mg by mouth daily.    Marland Kitchen KLOR-CON M20 20 MEQ tablet TAKE 1 TABLET DAILY  90 tablet 0  . levothyroxine (SYNTHROID, LEVOTHROID) 75 MCG tablet Take 75 mcg by mouth daily before breakfast.    . Multiple Vitamin (MULITIVITAMIN WITH MINERALS) TABS Take 1 tablet by mouth daily.    Marland Kitchen oxybutynin (DITROPAN-XL) 10 MG 24 hr tablet Take 10 mg by mouth at bedtime.    Marland Kitchen PARoxetine (PAXIL) 40 MG tablet Take 40 mg by mouth every morning.    . polyethylene glycol (MIRALAX / GLYCOLAX) packet Take 17 g by mouth daily as needed (for bowel movement).     . solifenacin (VESICARE) 10 MG tablet Take 10 mg by mouth daily as needed. Urinary frequency     No current facility-administered medications for this visit.    Allergies  Allergen Reactions  . Benazepril Swelling and Cough    Swelling of tongue  . Diclofenac Itching    Feels difficult to swallow, but no tongue swelling or SOB. (per pt, this isn't an issue)    Social History   Social History  . Marital Status: Married    Spouse Name: N/A  . Number of Children: N/A  . Years of Education: N/A   Occupational History  . Not on file.   Social History Main Topics  . Smoking status: Never Smoker   . Smokeless tobacco: Never Used  . Alcohol Use: 8.4 oz/week  14 Glasses of wine per week     Comment: ocassional glass of wine  . Drug Use: No  . Sexual Activity: Not Currently    Birth Control/ Protection: Post-menopausal   Other Topics Concern  . Not on file   Social History Narrative     Review of Systems: General: negative for chills, fever, night sweats or weight changes.  Cardiovascular: negative for chest pain, dyspnea on exertion, edema, orthopnea, palpitations, paroxysmal nocturnal dyspnea or shortness of breath Dermatological: negative for rash Respiratory: negative for cough or wheezing Urologic: negative for hematuria Abdominal: negative for nausea, vomiting, diarrhea, bright red blood per rectum, melena, or hematemesis Neurologic: negative for visual changes, syncope, or dizziness All other systems  reviewed and are otherwise negative except as noted above.    Blood pressure 132/62, pulse 79, height 5\' 7"  (1.702 m), weight 202 lb (91.627 kg), SpO2 93 %.  General appearance: alert, cooperative and no distress Neck: no carotid bruit and no JVD Lungs: clear to auscultation bilaterally Heart: irregularly irregular rhythm and regular rate Extremities: no LEE Pulses: 2+ and symmetric Skin: warm and dry Neurologic: Grossly normal  EKG chronic atrial fibrillation with a CVR of 69 bpm. No ischemia.   ASSESSMENT AND PLAN:   1. Chronic Atrial Fibrillation: rate is well controlled on Cardizem. BP is stable. She is fairly asymptomatic, with the exception of mild DOE. ? If due to elevated rates during ambulation vs LV dysfunction or worsening MV disease. Echo pending. Current pulse rate is in the 60s. For now we will continue current dose of Cardizem for rate control. Continue Eliquis for a/c. Patient denies falls and abnormal bleeding.   2. HTN: BP is well controlled on current regimen. Her PCP, Dr. Felipa Eth helps to manage this and follows labs.   3. DOE: mild DOE x 3 months. This has not worsened. No resting dyspnea, LEE, orthopnea or PND. Volume appears stable on exam. No CP. EKG w/o ischemia. Her last echo in 2014 showed normal LVEF and mild MVR. We will reorder a 2D Echo to reassess LVF and valve anatomy.    PLAN  Continue current regimen. F/u with Dr. Tamala Julian in 6 months.   Lyda Jester PA-C 10/31/2015 2:41 PM

## 2015-11-02 ENCOUNTER — Other Ambulatory Visit: Payer: Self-pay | Admitting: Interventional Cardiology

## 2015-11-16 ENCOUNTER — Other Ambulatory Visit: Payer: Self-pay

## 2015-11-16 ENCOUNTER — Ambulatory Visit (HOSPITAL_COMMUNITY): Payer: Medicare Other | Attending: Cardiology

## 2015-11-16 DIAGNOSIS — R0600 Dyspnea, unspecified: Secondary | ICD-10-CM | POA: Diagnosis present

## 2015-11-16 DIAGNOSIS — I059 Rheumatic mitral valve disease, unspecified: Secondary | ICD-10-CM | POA: Diagnosis not present

## 2015-11-16 DIAGNOSIS — I119 Hypertensive heart disease without heart failure: Secondary | ICD-10-CM | POA: Insufficient documentation

## 2015-11-16 DIAGNOSIS — I480 Paroxysmal atrial fibrillation: Secondary | ICD-10-CM | POA: Diagnosis not present

## 2015-11-16 DIAGNOSIS — I340 Nonrheumatic mitral (valve) insufficiency: Secondary | ICD-10-CM

## 2015-12-02 DIAGNOSIS — E039 Hypothyroidism, unspecified: Secondary | ICD-10-CM | POA: Diagnosis not present

## 2015-12-02 DIAGNOSIS — I480 Paroxysmal atrial fibrillation: Secondary | ICD-10-CM | POA: Diagnosis not present

## 2015-12-02 DIAGNOSIS — Z6831 Body mass index (BMI) 31.0-31.9, adult: Secondary | ICD-10-CM | POA: Diagnosis not present

## 2015-12-02 DIAGNOSIS — E7800 Pure hypercholesterolemia, unspecified: Secondary | ICD-10-CM | POA: Diagnosis not present

## 2015-12-02 DIAGNOSIS — I10 Essential (primary) hypertension: Secondary | ICD-10-CM | POA: Diagnosis not present

## 2015-12-02 DIAGNOSIS — Z7189 Other specified counseling: Secondary | ICD-10-CM | POA: Diagnosis not present

## 2015-12-02 DIAGNOSIS — Z79899 Other long term (current) drug therapy: Secondary | ICD-10-CM | POA: Diagnosis not present

## 2015-12-02 DIAGNOSIS — G4733 Obstructive sleep apnea (adult) (pediatric): Secondary | ICD-10-CM | POA: Diagnosis not present

## 2015-12-02 DIAGNOSIS — Z0000 Encounter for general adult medical examination without abnormal findings: Secondary | ICD-10-CM | POA: Diagnosis not present

## 2015-12-02 DIAGNOSIS — E669 Obesity, unspecified: Secondary | ICD-10-CM | POA: Diagnosis not present

## 2015-12-02 DIAGNOSIS — F3342 Major depressive disorder, recurrent, in full remission: Secondary | ICD-10-CM | POA: Diagnosis not present

## 2015-12-06 ENCOUNTER — Other Ambulatory Visit: Payer: Self-pay | Admitting: Interventional Cardiology

## 2016-01-23 ENCOUNTER — Other Ambulatory Visit: Payer: Self-pay | Admitting: Interventional Cardiology

## 2016-02-15 ENCOUNTER — Encounter: Payer: Self-pay | Admitting: Interventional Cardiology

## 2016-04-12 DIAGNOSIS — L821 Other seborrheic keratosis: Secondary | ICD-10-CM | POA: Diagnosis not present

## 2016-04-12 DIAGNOSIS — L218 Other seborrheic dermatitis: Secondary | ICD-10-CM | POA: Diagnosis not present

## 2016-04-19 ENCOUNTER — Encounter: Payer: Self-pay | Admitting: Interventional Cardiology

## 2016-05-07 ENCOUNTER — Encounter: Payer: Self-pay | Admitting: Interventional Cardiology

## 2016-05-07 ENCOUNTER — Encounter (INDEPENDENT_AMBULATORY_CARE_PROVIDER_SITE_OTHER): Payer: Self-pay

## 2016-05-07 ENCOUNTER — Ambulatory Visit (INDEPENDENT_AMBULATORY_CARE_PROVIDER_SITE_OTHER): Payer: Medicare Other | Admitting: Interventional Cardiology

## 2016-05-07 VITALS — BP 132/60 | HR 77 | Ht 67.0 in | Wt 206.6 lb

## 2016-05-07 DIAGNOSIS — I10 Essential (primary) hypertension: Secondary | ICD-10-CM | POA: Diagnosis not present

## 2016-05-07 DIAGNOSIS — I480 Paroxysmal atrial fibrillation: Secondary | ICD-10-CM

## 2016-05-07 DIAGNOSIS — Z7901 Long term (current) use of anticoagulants: Secondary | ICD-10-CM | POA: Diagnosis not present

## 2016-05-07 DIAGNOSIS — R0789 Other chest pain: Secondary | ICD-10-CM

## 2016-05-07 DIAGNOSIS — E782 Mixed hyperlipidemia: Secondary | ICD-10-CM

## 2016-05-07 NOTE — Patient Instructions (Signed)
Medication Instructions:  None  Labwork: None  Testing/Procedures: None  Follow-Up: Your physician wants you to follow-up in: 9-12 months with Dr. Smith.  You will receive a reminder letter in the mail two months in advance. If you don't receive a letter, please call our office to schedule the follow-up appointment.   Any Other Special Instructions Will Be Listed Below (If Applicable).     If you need a refill on your cardiac medications before your next appointment, please call your pharmacy.   
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
## 2016-06-06 DIAGNOSIS — I10 Essential (primary) hypertension: Secondary | ICD-10-CM | POA: Diagnosis not present

## 2016-06-06 DIAGNOSIS — I480 Paroxysmal atrial fibrillation: Secondary | ICD-10-CM | POA: Diagnosis not present

## 2016-06-06 DIAGNOSIS — Z1389 Encounter for screening for other disorder: Secondary | ICD-10-CM | POA: Diagnosis not present

## 2016-06-06 DIAGNOSIS — Z79899 Other long term (current) drug therapy: Secondary | ICD-10-CM | POA: Diagnosis not present

## 2016-06-14 DIAGNOSIS — Z1231 Encounter for screening mammogram for malignant neoplasm of breast: Secondary | ICD-10-CM | POA: Diagnosis not present

## 2016-06-14 DIAGNOSIS — Z853 Personal history of malignant neoplasm of breast: Secondary | ICD-10-CM | POA: Diagnosis not present

## 2016-09-04 ENCOUNTER — Other Ambulatory Visit: Payer: Self-pay | Admitting: *Deleted

## 2016-09-04 MED ORDER — DILTIAZEM HCL ER COATED BEADS 240 MG PO CP24: 240.0000 mg | ORAL_CAPSULE | Freq: Every day | ORAL | 2 refills | Status: DC

## 2016-09-04 MED ORDER — POTASSIUM CHLORIDE CRYS ER 20 MEQ PO TBCR: 20.0000 meq | EXTENDED_RELEASE_TABLET | Freq: Every day | ORAL | 2 refills | Status: DC

## 2016-09-25 ENCOUNTER — Other Ambulatory Visit: Payer: Self-pay | Admitting: *Deleted

## 2016-09-25 MED ORDER — APIXABAN 5 MG PO TABS: 5.0000 mg | ORAL_TABLET | Freq: Two times a day (BID) | ORAL | 1 refills | Status: DC

## 2016-10-08 ENCOUNTER — Other Ambulatory Visit: Payer: Self-pay | Admitting: Geriatric Medicine

## 2016-10-08 ENCOUNTER — Ambulatory Visit
Admission: RE | Admit: 2016-10-08 | Discharge: 2016-10-08 | Disposition: A | Discharge status: Home / Self Care | Payer: Medicare Other | Source: Ambulatory Visit | Attending: Geriatric Medicine | Admitting: Geriatric Medicine

## 2016-10-08 DIAGNOSIS — R05 Cough: Secondary | ICD-10-CM

## 2016-10-08 DIAGNOSIS — Z6832 Body mass index (BMI) 32.0-32.9, adult: Secondary | ICD-10-CM | POA: Diagnosis not present

## 2016-10-08 DIAGNOSIS — I480 Paroxysmal atrial fibrillation: Secondary | ICD-10-CM | POA: Diagnosis not present

## 2016-10-08 DIAGNOSIS — E669 Obesity, unspecified: Secondary | ICD-10-CM | POA: Diagnosis not present

## 2016-10-08 DIAGNOSIS — R059 Cough, unspecified: Secondary | ICD-10-CM

## 2016-10-08 DIAGNOSIS — I10 Essential (primary) hypertension: Secondary | ICD-10-CM | POA: Diagnosis not present

## 2016-10-18 DIAGNOSIS — H52203 Unspecified astigmatism, bilateral: Secondary | ICD-10-CM | POA: Diagnosis not present

## 2016-10-18 DIAGNOSIS — Z961 Presence of intraocular lens: Secondary | ICD-10-CM | POA: Diagnosis not present

## 2016-10-18 DIAGNOSIS — H524 Presbyopia: Secondary | ICD-10-CM | POA: Diagnosis not present

## 2016-10-18 DIAGNOSIS — H5213 Myopia, bilateral: Secondary | ICD-10-CM | POA: Diagnosis not present

## 2016-10-22 ENCOUNTER — Ambulatory Visit
Admission: RE | Admit: 2016-10-22 | Discharge: 2016-10-22 | Disposition: A | Discharge status: Home / Self Care | Payer: Medicare Other | Source: Ambulatory Visit | Attending: Geriatric Medicine | Admitting: Geriatric Medicine

## 2016-10-22 ENCOUNTER — Other Ambulatory Visit: Payer: Self-pay | Admitting: Geriatric Medicine

## 2016-10-22 DIAGNOSIS — J189 Pneumonia, unspecified organism: Secondary | ICD-10-CM | POA: Diagnosis not present

## 2016-11-26 ENCOUNTER — Telehealth: Payer: Self-pay | Admitting: Interventional Cardiology

## 2016-11-26 NOTE — Telephone Encounter (Signed)
Pt's husband calling needing clarification of medication that the pt is taking which is diltiazem 240 and 180 mg tablet. Husband stated that the pt has been taking 180 mg tablet. I informed the husband that Dr. Thompson Caul nurse Anderson Malta, LPN is not here and that she would be back tomorrow and I will send a note letting her know that the pt's husband would like a call back concerning this medication. Pt was last seen on 05/07/16, but there is no note stating that the medication was increased. Please advise

## 2016-11-27 NOTE — Telephone Encounter (Signed)
Left message to call back  

## 2016-11-29 NOTE — Telephone Encounter (Signed)
Attempted to call number listed, [phone], and it says the number has been disconnected.  Attempted to call home number listed, [phone] and it sounded like someone picked up.  Could hear talking and then call was disconnected.  Attempted to call back 3 times and line was busy.  Attempted mobile number listed and recording comes on stating subscriber is unavailable and no VM option was given.

## 2016-12-03 NOTE — Telephone Encounter (Signed)
Attempted home and mobile phone numbers.  Unable to leave VM on either number.

## 2016-12-05 NOTE — Telephone Encounter (Signed)
Spoke with husband (DPR) and asked about Diltiazem.  Husband unsure of what I was talking about.  Reminded him original call was in regards to whether pt should be taking 180mg  or 240mg .  Husband asked if ok for him to call back once they located medication.  Advised that would be fine but pt should be taking 180mg  QD as I do not see where dose was ever increased.  Advised husband once bottles were located to call me back and to also let me know which doctor's name was on the Diltiazem 240mg .  Husband in agreement with plan.

## 2016-12-27 DIAGNOSIS — F3342 Major depressive disorder, recurrent, in full remission: Secondary | ICD-10-CM | POA: Diagnosis not present

## 2016-12-27 DIAGNOSIS — G4733 Obstructive sleep apnea (adult) (pediatric): Secondary | ICD-10-CM | POA: Diagnosis not present

## 2016-12-27 DIAGNOSIS — Z79899 Other long term (current) drug therapy: Secondary | ICD-10-CM | POA: Diagnosis not present

## 2016-12-27 DIAGNOSIS — Z0000 Encounter for general adult medical examination without abnormal findings: Secondary | ICD-10-CM | POA: Diagnosis not present

## 2016-12-27 DIAGNOSIS — E039 Hypothyroidism, unspecified: Secondary | ICD-10-CM | POA: Diagnosis not present

## 2016-12-27 DIAGNOSIS — E669 Obesity, unspecified: Secondary | ICD-10-CM | POA: Diagnosis not present

## 2016-12-27 DIAGNOSIS — I480 Paroxysmal atrial fibrillation: Secondary | ICD-10-CM | POA: Diagnosis not present

## 2016-12-27 DIAGNOSIS — Z6834 Body mass index (BMI) 34.0-34.9, adult: Secondary | ICD-10-CM | POA: Diagnosis not present

## 2016-12-27 DIAGNOSIS — E7800 Pure hypercholesterolemia, unspecified: Secondary | ICD-10-CM | POA: Diagnosis not present

## 2016-12-27 DIAGNOSIS — Z1389 Encounter for screening for other disorder: Secondary | ICD-10-CM | POA: Diagnosis not present

## 2016-12-27 DIAGNOSIS — I10 Essential (primary) hypertension: Secondary | ICD-10-CM | POA: Diagnosis not present

## 2017-01-24 DIAGNOSIS — I10 Essential (primary) hypertension: Secondary | ICD-10-CM | POA: Diagnosis not present

## 2017-01-24 DIAGNOSIS — R682 Dry mouth, unspecified: Secondary | ICD-10-CM | POA: Diagnosis not present

## 2017-01-24 DIAGNOSIS — N3281 Overactive bladder: Secondary | ICD-10-CM | POA: Diagnosis not present

## 2017-05-06 ENCOUNTER — Other Ambulatory Visit: Payer: Self-pay | Admitting: Interventional Cardiology

## 2017-05-06 NOTE — Telephone Encounter (Signed)
Pt last saw Dr Tamala Julian 05/07/16, has upcoming appt scheduled for 05/10/17.  Pt's last labwork in chart is from 2014, last labs in care everywhere are from 2016.  Placed note on upcoming appt on 05/10/17 with Dr Tamala Julian needs CBC and BMP drawn at [place] f/u [place].  Age 76, weight 93.7, based on specified criteria pt is on appropriate dosage of Eliquis 5mg  BID, will await lab results to refill rx.

## 2017-05-09 NOTE — Progress Notes (Signed)
Cardiology Office Note    Date:  05/10/2017   ID:  [name], DOB August 12, 1940, MRN [id number]  PCP:  Lajean Manes, MD  Cardiologist: Sinclair Grooms, MD   Chief Complaint  Patient presents with  . Congestive Heart Failure  . Atrial Fibrillation    History of Present Illness:  [name] is a 76 y.o. female who presents for chronic atrial fibrillation, hypertension, chronic anticoagulation with Eliquis.  She is doing relatively well. She denies angina. She has occasional wheezing.. She notices lower extremity edema. She has become increasingly immobile and is gaining weight. She has not had syncope. She has occasional palpitations. No medication side effects.   Past Medical History:  Diagnosis Date  . Anal stenosis   . Arthritis    rheumatoid  . Atrial fibrillation   . Cancer [facility])    breast cancer  . Depression   . Dysrhythmia    atrial fibrilation  . History of cardioversion    02/04/2012  . Hyperlipidemia   . Hypertension   . Hypothyroidism   . OAB (overactive bladder)   . Sleep apnea    cpap---DON'T KNOW SETTINGS    Past Surgical History:  Procedure Laterality Date  . ABDOMINAL HYSTERECTOMY    . ANAL DILATION     multiple  . BREAST SURGERY     right mastectomy; left lumpectomy  . CARDIOVERSION  02/04/2012   Procedure: CARDIOVERSION;  Surgeon: Sinclair Grooms, MD;  Location: [place];  Service: Cardiovascular;  Laterality: N/A;  . [place]  . INCISION AND DRAINAGE PERIRECTAL ABSCESS  11/01/2011   Procedure: IRRIGATION AND DEBRIDEMENT PERIRECTAL ABSCESS;  Surgeon: Shann Medal, MD;  Location: [place];  Service: General;  Laterality: N/A;  Rigid Sigmiodoscopy ; Irrigation and Debridement left perineum and labia.  Marland Kitchen JOINT REPLACEMENT     right knee  . TOTAL KNEE ARTHROPLASTY Left 10/03/2012   Procedure: LEFT TOTAL KNEE ARTHROPLASTY;  Surgeon: Yvette Rack., MD;  Location: [place];  Service: Orthopedics;  Laterality: Left;    Current  Medications: Outpatient Medications Prior to Visit  Medication Sig Dispense Refill  . acetaminophen (TYLENOL) 325 MG tablet Take 975 mg by mouth every 6 (six) hours as needed for mild pain or moderate pain.     Marland Kitchen apixaban (ELIQUIS) 5 MG TABS tablet Take 1 tablet (5 mg total) by mouth 2 (two) times daily. 180 tablet 1  . atorvastatin (LIPITOR) 40 MG tablet Take 40 mg by mouth daily.    . calcium carbonate (TUMS - DOSED IN MG ELEMENTAL CALCIUM) 500 MG chewable tablet Chew 1 tablet by mouth daily.    Marland Kitchen diltiazem (CARDIZEM CD) 240 MG 24 hr capsule Take 1 capsule (240 mg total) by mouth daily. 90 capsule 2  . docusate sodium (COLACE) 100 MG capsule Take 300 mg by mouth at bedtime.     Marland Kitchen levothyroxine (SYNTHROID, LEVOTHROID) 100 MCG tablet Take 100 mcg by mouth daily before breakfast.    . Multiple Vitamin (MULITIVITAMIN WITH MINERALS) TABS Take 1 tablet by mouth daily.    Marland Kitchen oxybutynin (DITROPAN XL) 15 MG 24 hr tablet Take 15 mg by mouth daily.    Marland Kitchen PARoxetine (PAXIL) 10 MG tablet Take 10 mg by mouth daily. Take with Paxil 40 mg to equal 50 mg daily.    Marland Kitchen PARoxetine (PAXIL) 40 MG tablet Take 40 mg by mouth daily. Take with Paxil 10 mg to equal 50 mg daily.    Marland Kitchen  polyethylene glycol (MIRALAX / GLYCOLAX) packet Take 17 g by mouth daily as needed (for bowel movement).     . potassium chloride SA (KLOR-CON M20) 20 MEQ tablet Take 1 tablet (20 mEq total) by mouth daily. 90 tablet 2  . solifenacin (VESICARE) 10 MG tablet Take 10 mg by mouth daily as needed. Urinary frequency    . furosemide (LASIX) 20 MG tablet Take 20 mg by mouth daily.     No facility-administered medications prior to visit.      Allergies:   Benazepril and Diclofenac   Social History   Social History  . Marital status: Married    Spouse name: N/A  . Number of children: N/A  . Years of education: N/A   Social History Main Topics  . Smoking status: Never Smoker  . Smokeless tobacco: Never Used  . Alcohol use 8.4 oz/week    14  Glasses of wine per week     Comment: ocassional glass of wine  . Drug use: No  . Sexual activity: Not Currently    Birth control/ protection: Post-menopausal   Other Topics Concern  . Not on file   Social History Narrative  . No narrative on file     Family History:  The patient's family history includes Cancer in her maternal aunt and mother; Heart disease in her father.   ROS:   Please see the history of present illness.    Muscle pain, hearing loss, shortness of breath awakening her from sleep at night, leg swelling, leg pain, wheezing.  All other systems reviewed and are negative.   PHYSICAL EXAM:   VS:  BP (!) 162/80 (BP Location: Right Arm)   Pulse 65   Ht 5\' 7"  (1.702 m)   Wt 221 lb 6.4 oz (100.4 kg)   BMI 34.68 kg/m    GEN: Well nourished, well developed, in no acute distress  HEENT: normal  Neck: no JVD, carotid bruits, or masses Cardiac: IIRR; no murmurs, rubs, or gallops,no edema  Respiratory:  clear to auscultation bilaterally, normal work of breathing GI: soft, nontender, nondistended, + BS MS: no deformity or atrophy  Skin: warm and dry, no rash Neuro:  Alert and Oriented x 3, Strength and sensation are intact Psych: euthymic mood, full affect  Wt Readings from Last 3 Encounters:  05/10/17 221 lb 6.4 oz (100.4 kg)  05/07/16 206 lb 9.6 oz (93.7 kg)  10/31/15 202 lb (91.6 kg)      Studies/Labs Reviewed:   EKG:  EKG  Atrial fibrillation with controlled ventricular response at 65 bpm. QS pattern V1 and V2. When compared to prior tracings, no significant change has occurred.  Recent Labs: No results found for requested labs within last 8760 hours.   Lipid Panel No results found for: CHOL, TRIG, HDL, CHOLHDL, VLDL, LDLCALC, LDLDIRECT  Additional studies/ records that were reviewed today include:  2-D Doppler echocardiogram 11/16/15: Study Conclusions   - Left ventricle: Systolic function was normal. The estimated   ejection fraction was in the  range of 55% to 60%. - Mitral valve: Calcified annulus. - Left atrium: The atrium was moderately dilated. - Right atrium: The atrium was mildly dilated. - Atrial septum: A patent foramen ovale cannot be excluded.    ASSESSMENT:    1. Essential hypertension   2. PAF (paroxysmal atrial fibrillation) ([place])   3. Long term current use of anticoagulant therapy   4. Chronic diastolic HF (heart failure) ([facility])      PLAN:  In  order of problems listed above:  1. Blood pressure is high. Will increase diuretic therapy she appears to be volume overloaded. Hopefully this will improve blood pressure. Clinical follow-up in a month. Basic metabolic panel in one week. 2. Chronic atrial fibrillation with good rate control. 3. No bleeding on chronic anticoagulation therapy. 4. She appears volume overloaded. Likely developing some diastolic heart failure related to blood pressure and age. Atrial fibrillation further exacerbates the problem. Increase furosemide from 20 mg to 40 mg daily.  Overall plan is to treat the patient as possible decompensated diastolic heart failure by increasing diuretic therapy. Blood work will be done in one week and she will follow-up in office in one month.  Medication Adjustments/Labs and Tests Ordered: Current medicines are reviewed at length with the patient today.  Concerns regarding medicines are outlined above.  Medication changes, Labs and Tests ordered today are listed in the Patient Instructions below. Patient Instructions  Medication Instructions:  1) INCREASE Furosemide to 40mg  once daily  Labwork: Your physician recommends that you return for lab work in: 1 week (BMET, Pro BNP)   Testing/Procedures: None  Follow-Up: Your physician recommends that you schedule a follow-up appointment in: 1 month with Dr. Tamala Julian. (Can have 11:40A on 11/19)   Any Other Special Instructions Will Be Listed Below (If Applicable).     If you need a refill on your cardiac  medications before your next appointment, please call your pharmacy.      Signed, Sinclair Grooms, MD  05/10/2017 10:28 AM    [facility] [place], [place], [place]  [address] Phone: [phone]; Fax: [phone]

## 2017-05-10 ENCOUNTER — Ambulatory Visit (INDEPENDENT_AMBULATORY_CARE_PROVIDER_SITE_OTHER): Payer: Medicare Other | Admitting: Interventional Cardiology

## 2017-05-10 ENCOUNTER — Encounter: Payer: Self-pay | Admitting: Interventional Cardiology

## 2017-05-10 VITALS — BP 162/80 | HR 65 | Ht 67.0 in | Wt 221.4 lb

## 2017-05-10 DIAGNOSIS — Z7901 Long term (current) use of anticoagulants: Secondary | ICD-10-CM

## 2017-05-10 DIAGNOSIS — I10 Essential (primary) hypertension: Secondary | ICD-10-CM | POA: Diagnosis not present

## 2017-05-10 DIAGNOSIS — I5032 Chronic diastolic (congestive) heart failure: Secondary | ICD-10-CM

## 2017-05-10 DIAGNOSIS — I480 Paroxysmal atrial fibrillation: Secondary | ICD-10-CM | POA: Diagnosis not present

## 2017-05-10 MED ORDER — FUROSEMIDE 40 MG PO TABS: 40.0000 mg | ORAL_TABLET | Freq: Every day | ORAL | 3 refills | Status: DC

## 2017-05-10 NOTE — Patient Instructions (Addendum)
Medication Instructions:  1) INCREASE Furosemide to 40mg  once daily  Labwork: Your physician recommends that you return for lab work in: 1 week (BMET, Pro BNP, CBC)   Testing/Procedures: None  Follow-Up: Your physician recommends that you schedule a follow-up appointment in: 1 month with Dr. Tamala Julian. (Can have 11:40A on 11/19)   Any Other Special Instructions Will Be Listed Below (If Applicable).     If you need a refill on your cardiac medications before your next appointment, please call your pharmacy.

## 2017-05-16 ENCOUNTER — Other Ambulatory Visit: Payer: Medicare Other | Admitting: *Deleted

## 2017-05-16 DIAGNOSIS — I5032 Chronic diastolic (congestive) heart failure: Secondary | ICD-10-CM | POA: Diagnosis not present

## 2017-05-16 LAB — BASIC METABOLIC PANEL
BUN/Creatinine Ratio: 16 (ref 12–28)
BUN: 12 mg/dL (ref 8–27)
CALCIUM: 9.3 mg/dL (ref 8.7–10.3)
CHLORIDE: 99 mmol/L (ref 96–106)
CO2: 26 mmol/L (ref 20–29)
Creatinine, Ser: 0.74 mg/dL (ref 0.57–1.00)
GFR calc Af Amer: 90 mL/min/{1.73_m2} (ref >59)
GFR calc non Af Amer: 78 mL/min/{1.73_m2} (ref >59)
Glucose: 97 mg/dL (ref 65–99)
POTASSIUM: 4.1 mmol/L (ref 3.5–5.2)
Sodium: 140 mmol/L (ref 134–144)

## 2017-05-16 LAB — PRO B NATRIURETIC PEPTIDE: NT-Pro BNP: 1172 pg/mL — ABNORMAL HIGH (ref 0–738)

## 2017-05-20 ENCOUNTER — Telehealth: Payer: Self-pay

## 2017-05-20 NOTE — Telephone Encounter (Signed)
Advised patient of lab results with verbal understanding. Will forward a copy of labs to Dr. Lajean Manes. Patient had no questions.

## 2017-05-21 ENCOUNTER — Ambulatory Visit (INDEPENDENT_AMBULATORY_CARE_PROVIDER_SITE_OTHER): Payer: Medicare Other | Admitting: Interventional Cardiology

## 2017-05-21 ENCOUNTER — Encounter: Payer: Self-pay | Admitting: Interventional Cardiology

## 2017-05-21 VITALS — BP 154/68 | HR 62 | Ht 67.0 in | Wt 220.8 lb

## 2017-05-21 DIAGNOSIS — I10 Essential (primary) hypertension: Secondary | ICD-10-CM

## 2017-05-21 DIAGNOSIS — I480 Paroxysmal atrial fibrillation: Secondary | ICD-10-CM | POA: Diagnosis not present

## 2017-05-21 DIAGNOSIS — I5032 Chronic diastolic (congestive) heart failure: Secondary | ICD-10-CM

## 2017-05-21 MED ORDER — FUROSEMIDE 20 MG PO TABS: 20.0000 mg | ORAL_TABLET | Freq: Every day | ORAL | 3 refills | Status: DC

## 2017-05-21 MED ORDER — SPIRONOLACTONE 25 MG PO TABS: 25.0000 mg | ORAL_TABLET | Freq: Every day | ORAL | 3 refills | Status: DC

## 2017-05-21 NOTE — Progress Notes (Signed)
Cardiology Office Note    Date:  05/21/2017   ID:  [name], DOB [birth year], MRN [id number]  PCP:  Lajean Manes, MD  Cardiologist: Sinclair Grooms, MD   Chief Complaint  Patient presents with  . Congestive Heart Failure    History of Present Illness:  [name] is a 76 y.o. female  who presents for chronic atrial fibrillation,chronic diastolic heart failure(BNP 1172 -05/16/17), hypertension, chronic anticoagulation with Eliquis.here today for reassessment of clinical status after increasing diuretic therapy.  She has not adjusted her diuretic therapy is recommended. She still has wheezing when she lies down. Lower extremities is still swollen. She has hasn't increased diuretic therapy because of increased frequency of urination. She has difficulty ambulating and therefore has urine spillage which is embarrassing.   Past Medical History:  Diagnosis Date  . Anal stenosis   . Arthritis    rheumatoid  . Atrial fibrillation   . Cancer [facility])    breast cancer  . Depression   . Dysrhythmia    atrial fibrilation  . History of cardioversion    02/04/2012  . Hyperlipidemia   . Hypertension   . Hypothyroidism   . OAB (overactive bladder)   . Sleep apnea    cpap---DON'T KNOW SETTINGS    Past Surgical History:  Procedure Laterality Date  . ABDOMINAL HYSTERECTOMY    . ANAL DILATION     multiple  . BREAST SURGERY     right mastectomy; left lumpectomy  . CARDIOVERSION  02/04/2012   Procedure: CARDIOVERSION;  Surgeon: Sinclair Grooms, MD;  Location: [place];  Service: Cardiovascular;  Laterality: N/A;  . [place]  . INCISION AND DRAINAGE PERIRECTAL ABSCESS  11/01/2011   Procedure: IRRIGATION AND DEBRIDEMENT PERIRECTAL ABSCESS;  Surgeon: Shann Medal, MD;  Location: [place];  Service: General;  Laterality: N/A;  Rigid Sigmiodoscopy ; Irrigation and Debridement left perineum and labia.  Marland Kitchen JOINT REPLACEMENT     right knee  . TOTAL KNEE ARTHROPLASTY Left  10/03/2012   Procedure: LEFT TOTAL KNEE ARTHROPLASTY;  Surgeon: Yvette Rack., MD;  Location: [place];  Service: Orthopedics;  Laterality: Left;    Current Medications: Outpatient Medications Prior to Visit  Medication Sig Dispense Refill  . acetaminophen (TYLENOL) 325 MG tablet Take 975 mg by mouth every 6 (six) hours as needed for mild pain or moderate pain.     Marland Kitchen apixaban (ELIQUIS) 5 MG TABS tablet Take 1 tablet (5 mg total) by mouth 2 (two) times daily. 180 tablet 1  . atorvastatin (LIPITOR) 40 MG tablet Take 40 mg by mouth daily.    . calcium carbonate (TUMS - DOSED IN MG ELEMENTAL CALCIUM) 500 MG chewable tablet Chew 1 tablet by mouth daily.    Marland Kitchen diltiazem (CARDIZEM CD) 240 MG 24 hr capsule Take 1 capsule (240 mg total) by mouth daily. 90 capsule 2  . docusate sodium (COLACE) 100 MG capsule Take 300 mg by mouth at bedtime.     Marland Kitchen levothyroxine (SYNTHROID, LEVOTHROID) 100 MCG tablet Take 100 mcg by mouth daily before breakfast.    . Multiple Vitamin (MULITIVITAMIN WITH MINERALS) TABS Take 1 tablet by mouth daily.    Marland Kitchen oxybutynin (DITROPAN XL) 15 MG 24 hr tablet Take 15 mg by mouth daily.    Marland Kitchen PARoxetine (PAXIL) 10 MG tablet Take 10 mg by mouth daily. Take with Paxil 40 mg to equal 50 mg daily.    Marland Kitchen PARoxetine (PAXIL) 40 MG tablet  Take 40 mg by mouth daily. Take with Paxil 10 mg to equal 50 mg daily.    . polyethylene glycol (MIRALAX / GLYCOLAX) packet Take 17 g by mouth daily as needed (for bowel movement).     . furosemide (LASIX) 40 MG tablet Take 1 tablet (40 mg total) by mouth daily. 90 tablet 3  . potassium chloride SA (KLOR-CON M20) 20 MEQ tablet Take 1 tablet (20 mEq total) by mouth daily. 90 tablet 2  . solifenacin (VESICARE) 10 MG tablet Take 10 mg by mouth daily as needed. Urinary frequency     No facility-administered medications prior to visit.      Allergies:   Benazepril and Diclofenac   Social History   Social History  . Marital status: Married    Spouse name: N/A  .  Number of children: N/A  . Years of education: N/A   Social History Main Topics  . Smoking status: Never Smoker  . Smokeless tobacco: Never Used  . Alcohol use 8.4 oz/week    14 Glasses of wine per week     Comment: ocassional glass of wine  . Drug use: No  . Sexual activity: Not Currently    Birth control/ protection: Post-menopausal   Other Topics Concern  . None   Social History Narrative  . None     Family History:  The patient's family history includes Cancer in her maternal aunt and mother; Heart disease in her father.   ROS:   Please see the history of present illness.    Leg swelling, shortness of breath lying down, hearing loss, shortness of breath with activity, wheezing, back pain, difficulty with balance, and leg pain.  All other systems reviewed and are negative.   PHYSICAL EXAM:   VS:  BP (!) 154/68   Pulse 62   Ht 5\' 7"  (1.702 m)   Wt 220 lb 12.8 oz (100.2 kg)   BMI 34.58 kg/m    GEN: Well nourished, well developed, in no acute distress  HEENT: normal  Neck: no JVD, carotid bruits, or masses Cardiac: RRR; no murmurs, rubs, or gallops,no edema  Respiratory:  clear to auscultation bilaterally, normal work of breathing GI: soft, nontender, nondistended, + BS MS: no deformity or atrophy  Skin: warm and dry, no rash Neuro:  Alert and Oriented x 3, Strength and sensation are intact Psych: euthymic mood, full affect  Wt Readings from Last 3 Encounters:  05/21/17 220 lb 12.8 oz (100.2 kg)  05/10/17 221 lb 6.4 oz (100.4 kg)  05/07/16 206 lb 9.6 oz (93.7 kg)      Studies/Labs Reviewed:   EKG:  EKG  Not repeated.  Recent Labs: 05/16/2017: BUN 12; Creatinine, Ser 0.74; NT-Pro BNP 1,172; Potassium 4.1; Sodium 140   Lipid Panel No results found for: CHOL, TRIG, HDL, CHOLHDL, VLDL, LDLCALC, LDLDIRECT  Additional studies/ records that were reviewed today include:  BNP done at last visit was 1175.    ASSESSMENT:    1. Chronic diastolic HF (heart  failure) ([place])   2. PAF (paroxysmal atrial fibrillation) ([place])   3. Essential hypertension      PLAN:  In order of problems listed above:  1. Diastolic heart failure needs therapy. Gravida and increasing furosemide to 40 mg per day, we will simply use 20 mg per day since she has not been taking it as prescribed. In addition we will add Aldactone 25 mg per day. Once we have adequately removed volume perhaps furosemide will be discontinued and  Aldactone uses her only diuretic.the frequency and intensity of urination on Aldactone will be less than with a loop diuretic.She will need to return to clinic in 10-14 days with a basic metabolic panel and have an office visit with a team extender.  Clinical follow-up with me in 9-12 months assuming heart failure resolves.    Medication Adjustments/Labs and Tests Ordered: Current medicines are reviewed at length with the patient today.  Concerns regarding medicines are outlined above.  Medication changes, Labs and Tests ordered today are listed in the Patient Instructions below. Patient Instructions  Medication Instructions:  1) DECREASE Furosemide to 20mg  once daily. 2) START Aldactone 25mg  once daily.  Labwork: Your physician recommends that you return for lab work in: 10-14 days on the same day you see the PA or NP. (BMET)   Testing/Procedures: None  Follow-Up: Your physician recommends that you schedule a follow-up appointment in: 10-14 days with a PA or NP.  Your physician wants you to follow-up in: 6 months with Dr. Tamala Julian.  You will receive a reminder letter in the mail two months in advance. If you don't receive a letter, please call our office to schedule the follow-up appointment.    Any Other Special Instructions Will Be Listed Below (If Applicable).     If you need a refill on your cardiac medications before your next appointment, please call your pharmacy.      Signed, Sinclair Grooms, MD  05/21/2017 11:42 AM      [facility] [place], [place], [place]  [address] Phone: [phone]; Fax: [phone]

## 2017-05-21 NOTE — Patient Instructions (Signed)
Medication Instructions:  1) DECREASE Furosemide to 20mg  once daily. 2) START Aldactone 25mg  once daily.  Labwork: Your physician recommends that you return for lab work in: 10-14 days on the same day you see the PA or NP. (BMET)   Testing/Procedures: None  Follow-Up: Your physician recommends that you schedule a follow-up appointment in: 10-14 days with a PA or NP.  Your physician wants you to follow-up in: 6 months with Dr. Tamala Julian.  You will receive a reminder letter in the mail two months in advance. If you don't receive a letter, please call our office to schedule the follow-up appointment.    Any Other Special Instructions Will Be Listed Below (If Applicable).     If you need a refill on your cardiac medications before your next appointment, please call your pharmacy.

## 2017-06-03 ENCOUNTER — Ambulatory Visit (INDEPENDENT_AMBULATORY_CARE_PROVIDER_SITE_OTHER): Payer: Medicare Other | Admitting: Nurse Practitioner

## 2017-06-03 ENCOUNTER — Encounter: Payer: Self-pay | Admitting: Nurse Practitioner

## 2017-06-03 ENCOUNTER — Other Ambulatory Visit: Payer: Medicare Other | Admitting: *Deleted

## 2017-06-03 DIAGNOSIS — I5032 Chronic diastolic (congestive) heart failure: Secondary | ICD-10-CM | POA: Diagnosis not present

## 2017-06-03 LAB — BASIC METABOLIC PANEL
BUN/Creatinine Ratio: 16 (ref 12–28)
BUN: 14 mg/dL (ref 8–27)
CALCIUM: 9.4 mg/dL (ref 8.7–10.3)
CO2: 25 mmol/L (ref 20–29)
Chloride: 99 mmol/L (ref 96–106)
Creatinine, Ser: 0.87 mg/dL (ref 0.57–1.00)
GFR calc Af Amer: 74 mL/min/{1.73_m2} (ref >59)
GFR, EST NON AFRICAN AMERICAN: 64 mL/min/{1.73_m2} (ref >59)
Glucose: 115 mg/dL — ABNORMAL HIGH (ref 65–99)
POTASSIUM: 4.3 mmol/L (ref 3.5–5.2)
SODIUM: 137 mmol/L (ref 134–144)

## 2017-06-03 NOTE — Progress Notes (Signed)
CARDIOLOGY OFFICE NOTE  Date:  06/03/2017    [name] Date of Birth: 03-25-1941 Medical Record #[id number]  PCP:  Lajean Manes, MD  Cardiologist:  [employer]enn[employer]   Chief Complaint  Patient presents with  . Congestive Heart Failure    Follow up visit - seen for Dr. Tamala Julian    History of Present Illness: [name] is a 76 y.o. female who presents today for a follow up visit. Seen for Dr. Tamala Julian.   She has a history of chronic atrial fibrillation, chronic diastolic heart failure (BNP 1172 -05/16/17), hypertension, and chronic anticoagulation with Eliquis.   Just seen about 2 weeks ago - had been advised to increase her diuretic but had not due to urinary frequency. Her dose was cut back (really never increased) and Aldactone was added.   Comes in today. Here with her husband. She is in a wheelchair. Pretty much wheelchair dependent due to her neuropathy. Legs are dependent most of the time. Still with some DOE - noted no real change with recent med changes and says she is ok. She notes that it is hard for her to finish a sentence - this has been going on for about a year or so. On Ditropan for her urinary frequency as well. No chest pain. Overall she says she is ok.   Past Medical History:  Diagnosis Date  . Anal stenosis   . Arthritis    rheumatoid  . Atrial fibrillation   . Cancer [facility])    breast cancer  . Depression   . Dysrhythmia    atrial fibrilation  . History of cardioversion    02/04/2012  . Hyperlipidemia   . Hypertension   . Hypothyroidism   . OAB (overactive bladder)   . Sleep apnea    cpap---DON'T KNOW SETTINGS    Past Surgical History:  Procedure Laterality Date  . ABDOMINAL HYSTERECTOMY    . ANAL DILATION     multiple  . BREAST SURGERY     right mastectomy; left lumpectomy  . COLON SURGERY  1986  . JOINT REPLACEMENT     right knee     Medications: Current Meds  Medication Sig  . acetaminophen (TYLENOL) 325 MG tablet Take  975 mg by mouth every 6 (six) hours as needed for mild pain or moderate pain.   Marland Kitchen atorvastatin (LIPITOR) 40 MG tablet Take 40 mg by mouth daily.  Marland Kitchen diltiazem (CARDIZEM CD) 240 MG 24 hr capsule Take 1 capsule (240 mg total) by mouth daily.  Marland Kitchen docusate sodium (COLACE) 100 MG capsule Take 300 mg by mouth at bedtime.   Marland Kitchen ELIQUIS 5 MG TABS tablet TAKE 1 TABLET TWICE A DAY  . furosemide (LASIX) 20 MG tablet Take 1 tablet (20 mg total) by mouth daily.  Marland Kitchen levothyroxine (SYNTHROID, LEVOTHROID) 100 MCG tablet Take 100 mcg by mouth daily before breakfast.  . PARoxetine (PAXIL) 10 MG tablet Take 10 mg by mouth daily. Take with Paxil 40 mg to equal 50 mg daily.  Marland Kitchen PARoxetine (PAXIL) 40 MG tablet Take 40 mg by mouth daily. Take with Paxil 10 mg to equal 50 mg daily.  Marland Kitchen spironolactone (ALDACTONE) 25 MG tablet Take 1 tablet (25 mg total) by mouth daily.     Allergies: Allergies  Allergen Reactions  . Benazepril Swelling and Cough    Swelling of tongue  . Diclofenac Itching    Feels difficult to swallow, but no tongue swelling or SOB. (per pt, this isn't an  issue)    Social History: The patient  reports that  has never smoked. she has never used smokeless tobacco. She reports that she drinks about 8.4 oz of alcohol per week. She reports that she does not use drugs.   Family History: The patient's family history includes Cancer in her maternal aunt and mother; Heart disease in her father.   Review of Systems: Please see the history of present illness.   Otherwise, the review of systems is positive for none.   All other systems are reviewed and negative.   Physical Exam: VS:  BP (!) 158/70 (BP Location: Left Arm, Patient Position: Sitting, Cuff Size: Normal)   Pulse 61   Ht 5\' 7"  (1.702 m)   Wt 220 lb 6.4 oz (100 kg)   SpO2 96% Comment: at rest  BMI 34.52 kg/m  .  BMI Body mass index is 34.52 kg/m.  Wt Readings from Last 3 Encounters:  06/03/17 220 lb 6.4 oz (100 kg)  05/21/17 220 lb 12.8 oz  (100.2 kg)  05/10/17 221 lb 6.4 oz (100.4 kg)    General: Pleasant. Elderly. She is in a wheelchair.   HEENT: Normal.  Neck: Supple, no JVD, carotid bruits, or masses noted.  Cardiac: Irregular irregular rhythm. Rate is ok. Heart tones are distant.  Legs are full with 1+edema.  Respiratory:  Lungs are clear to auscultation bilaterally with normal work of breathing.  GI: Soft and nontender.  MS: No deformity or atrophy. Gait not tested.  Skin: Warm and dry. Color is normal.  Neuro:  Strength and sensation are intact and no gross focal deficits noted.  Psych: Alert, appropriate and with normal affect.   LABORATORY DATA:  EKG:  EKG is not ordered today.  Lab Results  Component Value Date   WBC 7.7 04/29/2013   HGB 12.4 04/29/2013   HCT 36.7 04/29/2013   PLT 299.0 04/29/2013   GLUCOSE 97 05/16/2017   ALT 20 09/25/2012   AST 23 09/25/2012   NA 140 05/16/2017   K 4.1 05/16/2017   CL 99 05/16/2017   CREATININE 0.74 05/16/2017   BUN 12 05/16/2017   CO2 26 05/16/2017   TSH 12.782 (H) 11/06/2011   INR 0.97 09/25/2012     BNP (last 3 results) No results for input(s): BNP in the last 8760 hours.  ProBNP (last 3 results) Recent Labs    05/16/17 1229  PROBNP 1,172*     Other Studies Reviewed Today:  Echo Study Conclusions 10/2015  - Left ventricle: Systolic function was normal. The estimated   ejection fraction was in the range of 55% to 60%. - Mitral valve: Calcified annulus. - Left atrium: The atrium was moderately dilated. - Right atrium: The atrium was mildly dilated. - Atrial septum: A patent foramen ovale cannot be excluded.  Assessment/Plan:  1. Acute on chronic diastolic HF - her weight is unchanged. Breathing seems stable - her dyspnea is most likely multifactorial. Needs repeat lab today. Needs good BP control. May increase her Aldactone or consider ACE/ARB therapy going forward. She feels like she restricts her salt.   2. Persistent AF - managed with  rate control and anticoagulation.   3. Chronic anticoagulation - needs a CBC checked.   4. HTN -  Recheck by me is 150/80. May could increase Aldactone. I have asked her to monitor BP at home as well.   5. Sedentary - maybe some PT would help - she is going to ask Dr. Felipa Eth. There is a  pool where they live - may need to entertain some sort of water program.   Current medicines are reviewed with the patient today.  The patient does not have concerns regarding medicines other than what has been noted above.  The following changes have been made:  See above.  Labs/ tests ordered today include:    Orders Placed This Encounter  Procedures  . CBC  . Pro b natriuretic peptide (BNP)     Disposition:   Further disposition to follow.   Patient is agreeable to this plan and will call if any problems develop in the interim.   SignedTruitt Merle, NP  06/03/2017 12:14 PM  [facility] [address] [place], [place]  [address] Phone: [phone] Fax: [phone]

## 2017-06-03 NOTE — Patient Instructions (Addendum)
We will be in touch with you about your lab from today.   We are checking CBC and BNP today as well.   Medication Instructions:    Continue with your current medicines. We may increase the Aldactone but need to see what your lab shows first.     Testing/Procedures To Be Arranged:  N/A  Follow-Up:   See Dr. Tamala Julian in about 2 to 3 months    Other Special Instructions:   Knee high support stockings  Keep restricting your salt   Stay as mobile as you can.   Please discuss with Dr. Felipa Eth about possible physical therapy.     If you need a refill on your cardiac medications before your next appointment, please call your pharmacy.   Call the [place] office at [phone] if you have any questions, problems or concerns.

## 2017-06-04 ENCOUNTER — Telehealth: Payer: Self-pay

## 2017-06-04 LAB — CBC
Hematocrit: 39.1 % (ref 34.0–46.6)
Hemoglobin: 12.8 g/dL (ref 11.1–15.9)
MCH: 28 pg (ref 26.6–33.0)
MCHC: 32.7 g/dL (ref 31.5–35.7)
MCV: 86 fL (ref 79–97)
Platelets: 383 10*3/uL — ABNORMAL HIGH (ref 150–379)
RBC: 4.57 x10E6/uL (ref 3.77–5.28)
RDW: 15 % (ref 12.3–15.4)
WBC: 7.8 10*3/uL (ref 3.4–10.8)

## 2017-06-04 LAB — PRO B NATRIURETIC PEPTIDE: NT-Pro BNP: 722 pg/mL (ref 0–738)

## 2017-06-04 NOTE — Telephone Encounter (Signed)
Spoke with patient about recent lab results.  patient verbalized understanding. patient thanked me for my call. copy sent to PCP.

## 2017-06-17 ENCOUNTER — Other Ambulatory Visit: Payer: Medicare Other

## 2017-06-17 ENCOUNTER — Ambulatory Visit: Payer: Medicare Other | Admitting: Interventional Cardiology

## 2017-06-18 DIAGNOSIS — Z1231 Encounter for screening mammogram for malignant neoplasm of breast: Secondary | ICD-10-CM | POA: Diagnosis not present

## 2017-06-18 DIAGNOSIS — Z853 Personal history of malignant neoplasm of breast: Secondary | ICD-10-CM | POA: Diagnosis not present

## 2017-07-19 DIAGNOSIS — I10 Essential (primary) hypertension: Secondary | ICD-10-CM | POA: Diagnosis not present

## 2017-07-19 DIAGNOSIS — E669 Obesity, unspecified: Secondary | ICD-10-CM | POA: Diagnosis not present

## 2017-07-19 DIAGNOSIS — I480 Paroxysmal atrial fibrillation: Secondary | ICD-10-CM | POA: Diagnosis not present

## 2017-07-19 DIAGNOSIS — Z6834 Body mass index (BMI) 34.0-34.9, adult: Secondary | ICD-10-CM | POA: Diagnosis not present

## 2017-07-24 DIAGNOSIS — C801 Malignant (primary) neoplasm, unspecified: Secondary | ICD-10-CM | POA: Insufficient documentation

## 2017-07-24 DIAGNOSIS — G4730 Sleep apnea, unspecified: Secondary | ICD-10-CM | POA: Insufficient documentation

## 2017-07-24 DIAGNOSIS — M1990 Unspecified osteoarthritis, unspecified site: Secondary | ICD-10-CM | POA: Insufficient documentation

## 2017-07-24 DIAGNOSIS — N3281 Overactive bladder: Secondary | ICD-10-CM | POA: Insufficient documentation

## 2017-07-24 DIAGNOSIS — Z9889 Other specified postprocedural states: Secondary | ICD-10-CM | POA: Insufficient documentation

## 2017-07-24 DIAGNOSIS — E039 Hypothyroidism, unspecified: Secondary | ICD-10-CM | POA: Insufficient documentation

## 2017-07-24 DIAGNOSIS — Z9289 Personal history of other medical treatment: Secondary | ICD-10-CM | POA: Insufficient documentation

## 2017-07-24 DIAGNOSIS — I499 Cardiac arrhythmia, unspecified: Secondary | ICD-10-CM | POA: Insufficient documentation

## 2017-07-24 DIAGNOSIS — K624 Stenosis of anus and rectum: Secondary | ICD-10-CM | POA: Insufficient documentation

## 2017-08-13 NOTE — Progress Notes (Signed)
Cardiology Office Note    Date:  08/14/2017   ID:  [name], DOB September 30, 1940, MRN [id number]  PCP:  Tammy Manes, MD  Cardiologist: Tammy Grooms, MD   Chief Complaint  Patient presents with  . Congestive Heart Failure  . Coronary Artery Disease    History of Present Illness:  [name] is a 77 y.o. female with chronic atrial fibrillation, diastolic heart failure, hypertension, chronic anticoagulation therapy, rheumatoid arthritis, and obstructive sleep apnea.  Patient is reluctant to use adequate doses of diuretic therapy because of frequent urination.  The patient has been noted to have progressive lower extremity swelling and dyspnea.  Low-dose diuretic therapy has been started.  She complains about nocturnal urination.  Subsequently I have identified that she is taking both Aldactone and furosemide at night prior to bedtime.  She denies chest pain.  Past Medical History:  Diagnosis Date  . Anal stenosis   . Arthritis    rheumatoid  . Atrial fibrillation   . Cancer [facility])    breast cancer  . Depression   . Dysrhythmia    atrial fibrilation  . History of cardioversion    02/04/2012  . Hyperlipidemia   . Hypertension   . Hypothyroidism   . OAB (overactive bladder)   . Sleep apnea    cpap---DON'T KNOW SETTINGS    Past Surgical History:  Procedure Laterality Date  . ABDOMINAL HYSTERECTOMY    . ANAL DILATION     multiple  . BREAST SURGERY     right mastectomy; left lumpectomy  . CARDIOVERSION  02/04/2012   Procedure: CARDIOVERSION;  Surgeon: Tammy Grooms, MD;  Location: [place];  Service: Cardiovascular;  Laterality: N/A;  . [place]  . INCISION AND DRAINAGE PERIRECTAL ABSCESS  11/01/2011   Procedure: IRRIGATION AND DEBRIDEMENT PERIRECTAL ABSCESS;  Surgeon: Tammy Medal, MD;  Location: [place];  Service: General;  Laterality: N/A;  Rigid Sigmiodoscopy ; Irrigation and Debridement left perineum and labia.  Marland Kitchen JOINT REPLACEMENT     right  knee  . TOTAL KNEE ARTHROPLASTY Left 10/03/2012   Procedure: LEFT TOTAL KNEE ARTHROPLASTY;  Surgeon: [name]., MD;  Location: [place];  Service: Orthopedics;  Laterality: Left;    Current Medications: Outpatient Medications Prior to Visit  Medication Sig Dispense Refill  . acetaminophen (TYLENOL) 325 MG tablet Take 975 mg by mouth every 6 (six) hours as needed for mild pain or moderate pain.     Marland Kitchen atorvastatin (LIPITOR) 40 MG tablet Take 40 mg by mouth daily.    . calcium carbonate (TUMS - DOSED IN MG ELEMENTAL CALCIUM) 500 MG chewable tablet Chew 1 tablet by mouth daily.    Marland Kitchen diltiazem (CARDIZEM CD) 240 MG 24 hr capsule Take 1 capsule (240 mg total) by mouth daily. 90 capsule 2  . docusate sodium (COLACE) 100 MG capsule Take 300 mg by mouth at bedtime.     Marland Kitchen ELIQUIS 5 MG TABS tablet TAKE 1 TABLET TWICE A DAY 180 tablet 1  . furosemide (LASIX) 20 MG tablet Take 1 tablet (20 mg total) by mouth daily. 90 tablet 3  . levothyroxine (SYNTHROID, LEVOTHROID) 100 MCG tablet Take 100 mcg by mouth daily before breakfast.    . PARoxetine (PAXIL) 10 MG tablet Take 10 mg by mouth daily. Take with Paxil 40 mg to equal 50 mg daily.    Marland Kitchen PARoxetine (PAXIL) 40 MG tablet Take 40 mg by mouth daily. Take with Paxil 10  mg to equal 50 mg daily.    Marland Kitchen spironolactone (ALDACTONE) 25 MG tablet Take 1 tablet (25 mg total) by mouth daily. 90 tablet 3   No facility-administered medications prior to visit.      Allergies:   Benazepril and Diclofenac   Social History   Socioeconomic History  . Marital status: Married    Spouse name: None  . Number of children: None  . Years of education: None  . Highest education level: None  Social Needs  . Financial resource strain: None  . Food insecurity - worry: None  . Food insecurity - inability: None  . Transportation needs - medical: None  . Transportation needs - non-medical: None  Occupational History  . None  Tobacco Use  . Smoking status: Never Smoker  .  Smokeless tobacco: Never Used  Substance and Sexual Activity  . Alcohol use: Yes    Alcohol/week: 8.4 oz    Types: 14 Glasses of wine per week    Comment: ocassional glass of wine  . Drug use: No  . Sexual activity: Not Currently    Birth control/protection: Post-menopausal  Other Topics Concern  . None  Social History Narrative  . None     Family History:  The patient's family history includes Cancer in her maternal aunt and mother; Heart disease in her father.   ROS:   Please see the history of present illness.    Weight gain, physical inactivity, otherwise no complaints other than nocturia. All other systems reviewed and are negative.   PHYSICAL EXAM:   VS:  BP (!) 146/88   Pulse 73   Ht 5\' 7"  (1.702 m)   Wt 221 lb 9.6 oz (100.5 kg)   BMI 34.71 kg/m    GEN: Well nourished, well developed, in no acute distress  HEENT: normal  Neck: no JVD, carotid bruits, or masses Cardiac: IIRR; no murmurs, rubs, or gallops,no edema  Respiratory:  clear to auscultation bilaterally, normal work of breathing GI: soft, nontender, nondistended, + BS MS: no deformity or atrophy  Skin: warm and dry, no rash Neuro:  Alert and Oriented x 3, Strength and sensation are intact Psych: euthymic mood, full affect  Wt Readings from Last 3 Encounters:  08/14/17 221 lb 9.6 oz (100.5 kg)  06/03/17 220 lb 6.4 oz (100 kg)  05/21/17 220 lb 12.8 oz (100.2 kg)      Studies/Labs Reviewed:   EKG:  EKG not repeated  Recent Labs: 06/03/2017: BUN 14; Creatinine, Ser 0.87; Hemoglobin 12.8; NT-Pro BNP 722; Platelets 383; Potassium 4.3; Sodium 137   Lipid Panel No results found for: CHOL, TRIG, HDL, CHOLHDL, VLDL, LDLCALC, LDLDIRECT  Additional studies/ records that were reviewed today include:  Recent BNP is now less than 800 had been greater than 1400 before starting Aldactone.    ASSESSMENT:    1. Chronic diastolic HF (heart failure) ([place])   2. Essential hypertension   3. PAF (paroxysmal  atrial fibrillation) ([facility])      PLAN:  In order of problems listed above:  1. Improved with diuretic therapy.  Lungs are clear head neck veins are flat.  BNP is down.  Change diuretic dosing to a.m. rather than p.m.  Clinical follow-up in 3-6 months. 2. Adequate blood pressure control. 3. Controlled rate.  Continue anticoagulation therapy.  No further changes in diuretic regimen other than to start taking the medications in the morning rather than at bedtime.  Clinical follow-up 6 months.    Medication Adjustments/Labs and  Tests Ordered: Current medicines are reviewed at length with the patient today.  Concerns regarding medicines are outlined above.  Medication changes, Labs and Tests ordered today are listed in the Patient Instructions below. Patient Instructions  Medication Instructions:  1) START taking your Furosemide and Spironolactone in the morning.  Contact our office in 2-3 weeks and let us know if you have improvement with the time change.  Labwork: None  Testing/Procedures: None  Follow-Up: Your physician recommends that you schedule a follow-up appointment in: 4-6 months with Dr. Tamala Julian.   Any Other Special Instructions Will Be Listed Below (If Applicable).     If you need a refill on your cardiac medications before your next appointment, please call your pharmacy.      Signed, Tammy Grooms, MD  08/14/2017 12:05 PM    [facility] [address], [place], [place]  [address] Phone: [phone]; Fax: [phone]

## 2017-08-14 ENCOUNTER — Ambulatory Visit (INDEPENDENT_AMBULATORY_CARE_PROVIDER_SITE_OTHER): Payer: Medicare Other | Admitting: Interventional Cardiology

## 2017-08-14 ENCOUNTER — Encounter: Payer: Self-pay | Admitting: Interventional Cardiology

## 2017-08-14 VITALS — BP 146/88 | HR 73 | Ht 67.0 in | Wt 221.6 lb

## 2017-08-14 DIAGNOSIS — I5032 Chronic diastolic (congestive) heart failure: Secondary | ICD-10-CM | POA: Diagnosis not present

## 2017-08-14 DIAGNOSIS — I10 Essential (primary) hypertension: Secondary | ICD-10-CM | POA: Diagnosis not present

## 2017-08-14 DIAGNOSIS — I480 Paroxysmal atrial fibrillation: Secondary | ICD-10-CM | POA: Diagnosis not present

## 2017-08-14 NOTE — Patient Instructions (Signed)
Medication Instructions:  1) START taking your Furosemide and Spironolactone in the morning.  Contact our office in 2-3 weeks and let us know if you have improvement with the time change.  Labwork: None  Testing/Procedures: None  Follow-Up: Your physician recommends that you schedule a follow-up appointment in: 4-6 months with Dr. Tamala Julian.   Any Other Special Instructions Will Be Listed Below (If Applicable).     If you need a refill on your cardiac medications before your next appointment, please call your pharmacy.

## 2017-08-16 ENCOUNTER — Other Ambulatory Visit: Payer: Self-pay | Admitting: Interventional Cardiology

## 2017-08-16 MED ORDER — DILTIAZEM HCL ER COATED BEADS 240 MG PO CP24: 240.0000 mg | ORAL_CAPSULE | Freq: Every day | ORAL | 3 refills | Status: DC

## 2017-08-16 NOTE — Telephone Encounter (Signed)
Pt's medication was sent to pt's pharmacy as requested. Confirmation received.  °

## 2017-08-16 NOTE — Telephone Encounter (Signed)
°*  STAT* If patient is at the pharmacy, call can be transferred to refill team.   1. Which medications need to be refilled? (please list name of each medication and dose if known) Diltiazem (Needs a new prescription sent )   2. Which pharmacy/location (including street and city if local pharmacy) is medication to be sent to?[facility] on [address] in [place]   3. Do they need a 30 day or 90 day supply? [place]

## 2017-08-29 DIAGNOSIS — H6123 Impacted cerumen, bilateral: Secondary | ICD-10-CM | POA: Diagnosis not present

## 2017-09-03 ENCOUNTER — Other Ambulatory Visit: Payer: Self-pay | Admitting: Interventional Cardiology

## 2017-10-21 DIAGNOSIS — L821 Other seborrheic keratosis: Secondary | ICD-10-CM | POA: Diagnosis not present

## 2017-10-28 DIAGNOSIS — Z961 Presence of intraocular lens: Secondary | ICD-10-CM | POA: Diagnosis not present

## 2017-11-05 DIAGNOSIS — H6123 Impacted cerumen, bilateral: Secondary | ICD-10-CM | POA: Diagnosis not present

## 2017-11-20 ENCOUNTER — Encounter: Payer: Self-pay | Admitting: Interventional Cardiology

## 2017-11-21 ENCOUNTER — Other Ambulatory Visit: Payer: Self-pay | Admitting: Interventional Cardiology

## 2017-11-21 NOTE — Telephone Encounter (Signed)
Age 77 years Wt 100.5kg 08/14/2017 Saw Dr Tamala Julian 08/14/2017 06/03/2017 Hgb 12.8 HCT 39.1  06/03/2017 SrCr 0.87 Refill done for Eliquis 5mg  q 12 hours as requested

## 2017-12-03 ENCOUNTER — Ambulatory Visit: Payer: Medicare Other | Admitting: Interventional Cardiology

## 2017-12-05 ENCOUNTER — Encounter: Payer: Self-pay | Admitting: Interventional Cardiology

## 2017-12-05 ENCOUNTER — Ambulatory Visit (INDEPENDENT_AMBULATORY_CARE_PROVIDER_SITE_OTHER): Payer: Medicare Other | Admitting: Interventional Cardiology

## 2017-12-05 VITALS — BP 146/70 | HR 50 | Ht 67.0 in | Wt 232.2 lb

## 2017-12-05 DIAGNOSIS — Z7901 Long term (current) use of anticoagulants: Secondary | ICD-10-CM

## 2017-12-05 DIAGNOSIS — I480 Paroxysmal atrial fibrillation: Secondary | ICD-10-CM

## 2017-12-05 DIAGNOSIS — I5032 Chronic diastolic (congestive) heart failure: Secondary | ICD-10-CM

## 2017-12-05 DIAGNOSIS — E782 Mixed hyperlipidemia: Secondary | ICD-10-CM | POA: Diagnosis not present

## 2017-12-05 DIAGNOSIS — I10 Essential (primary) hypertension: Secondary | ICD-10-CM

## 2017-12-05 LAB — BASIC METABOLIC PANEL
BUN/Creatinine Ratio: 16 (ref 12–28)
BUN: 14 mg/dL (ref 8–27)
CO2: 26 mmol/L (ref 20–29)
CREATININE: 0.86 mg/dL (ref 0.57–1.00)
Calcium: 9.5 mg/dL (ref 8.7–10.3)
Chloride: 99 mmol/L (ref 96–106)
GFR calc Af Amer: 75 mL/min/{1.73_m2} (ref >59)
GFR calc non Af Amer: 65 mL/min/{1.73_m2} (ref >59)
GLUCOSE: 104 mg/dL — AB (ref 65–99)
Potassium: 4.7 mmol/L (ref 3.5–5.2)
Sodium: 139 mmol/L (ref 134–144)

## 2017-12-05 LAB — CBC
HEMATOCRIT: 40.1 % (ref 34.0–46.6)
Hemoglobin: 13.3 g/dL (ref 11.1–15.9)
MCH: 28.5 pg (ref 26.6–33.0)
MCHC: 33.2 g/dL (ref 31.5–35.7)
MCV: 86 fL (ref 79–97)
Platelets: 302 10*3/uL (ref 150–379)
RBC: 4.66 x10E6/uL (ref 3.77–5.28)
RDW: 14.6 % (ref 12.3–15.4)
WBC: 8 10*3/uL (ref 3.4–10.8)

## 2017-12-05 NOTE — Patient Instructions (Addendum)
Medication Instructions:  Your physician recommends that you continue on your current medications as directed. Please refer to the Current Medication list given to you today.   Labwork: BMET and CBC today  Testing/Procedures: None  Follow-Up: Your physician wants you to follow-up in: 6-9 months with a PA or NP.  You will receive a reminder letter in the mail two months in advance. If you don't receive a letter, please call our office to schedule the follow-up appointment.   Any Other Special Instructions Will Be Listed Below (If Applicable).     If you need a refill on your cardiac medications before your next appointment, please call your pharmacy.

## 2017-12-05 NOTE — Progress Notes (Signed)
Cardiology Office Note    Date:  12/05/2017   ID:  [name], DOB 11/10/1940, MRN [id number]  PCP:  Lajean Manes, MD  Cardiologist: Sinclair Grooms, MD   Chief Complaint  Patient presents with  . Congestive Heart Failure  . Atrial Fibrillation    History of Present Illness:  [name] is a 77 y.o. female  with chronic atrial fibrillation, diastolic heart failure, hypertension, chronic anticoagulation therapy, rheumatoid arthritis, and obstructive sleep apnea.  Patient is reluctant to use adequate doses of diuretic therapy because of frequent urination.  This he is doing well.  She is wheeled in by her husband.  She is very sedentary and involved in no activities/exercise at all.  She denies chest pain.  She has not had syncope.  She does note the lower extremity swelling is improved.  Orthopnea has improved.   Past Medical History:  Diagnosis Date  . Anal stenosis   . Arthritis    rheumatoid  . Atrial fibrillation   . Cancer [facility])    breast cancer  . Depression   . Dysrhythmia    atrial fibrilation  . History of cardioversion    02/04/2012  . Hyperlipidemia   . Hypertension   . Hypothyroidism   . OAB (overactive bladder)   . Sleep apnea    cpap---DON'T KNOW SETTINGS    Past Surgical History:  Procedure Laterality Date  . ABDOMINAL HYSTERECTOMY    . ANAL DILATION     multiple  . BREAST SURGERY     right mastectomy; left lumpectomy  . CARDIOVERSION  02/04/2012   Procedure: CARDIOVERSION;  Surgeon: Sinclair Grooms, MD;  Location: [place];  Service: Cardiovascular;  Laterality: N/A;  . [place]  . INCISION AND DRAINAGE PERIRECTAL ABSCESS  11/01/2011   Procedure: IRRIGATION AND DEBRIDEMENT PERIRECTAL ABSCESS;  Surgeon: Shann Medal, MD;  Location: [place];  Service: General;  Laterality: N/A;  Rigid Sigmiodoscopy ; Irrigation and Debridement left perineum and labia.  Marland Kitchen JOINT REPLACEMENT     right knee  . TOTAL KNEE ARTHROPLASTY Left 10/03/2012     Procedure: LEFT TOTAL KNEE ARTHROPLASTY;  Surgeon: Yvette Rack., MD;  Location: [facility] [facility];  Service: Orthopedics;  Laterality: Left;    Current Medications: Outpatient Medications Prior to Visit  Medication Sig Dispense Refill  . acetaminophen (TYLENOL) 325 MG tablet Take 975 mg by mouth every 6 (six) hours as needed for mild pain or moderate pain.     Marland Kitchen apixaban (ELIQUIS) 5 MG TABS tablet Take 5 mg by mouth 2 (two) times daily.    Marland Kitchen atorvastatin (LIPITOR) 40 MG tablet Take 40 mg by mouth daily.    . calcium carbonate (TUMS - DOSED IN MG ELEMENTAL CALCIUM) 500 MG chewable tablet Chew 1 tablet by mouth daily.    Marland Kitchen diltiazem (CARDIZEM CD) 240 MG 24 hr capsule Take 240 mg by mouth daily.    Marland Kitchen docusate sodium (COLACE) 100 MG capsule Take 300 mg by mouth at bedtime.     . furosemide (LASIX) 20 MG tablet Take 20 mg by mouth daily.    Marland Kitchen levothyroxine (SYNTHROID, LEVOTHROID) 100 MCG tablet Take 100 mcg by mouth daily before breakfast.    . PARoxetine (PAXIL) 10 MG tablet Take 10 mg by mouth daily. Take with Paxil 40 mg to equal 50 mg daily.    Marland Kitchen PARoxetine (PAXIL) 40 MG tablet Take 40 mg by mouth daily. Take with Paxil 10 mg to  equal 50 mg daily.    Marland Kitchen spironolactone (ALDACTONE) 25 MG tablet Take 25 mg by mouth daily.    Marland Kitchen diltiazem (CARDIZEM CD) 240 MG 24 hr capsule TAKE 1 CAPSULE DAILY (Patient not taking: Reported on 12/05/2017) 90 capsule 3  . ELIQUIS 5 MG TABS tablet TAKE 1 TABLET TWICE A DAY (Patient not taking: Reported on 12/05/2017) 180 tablet 1  . furosemide (LASIX) 20 MG tablet Take 1 tablet (20 mg total) by mouth daily. 90 tablet 3  . spironolactone (ALDACTONE) 25 MG tablet Take 1 tablet (25 mg total) by mouth daily. 90 tablet 3   No facility-administered medications prior to visit.      Allergies:   Benazepril and Diclofenac   Social History   Socioeconomic History  . Marital status: Married    Spouse name: Not on file  . Number of children: Not on file  . Years of education: Not  on file  . Highest education level: Not on file  Occupational History  . Not on file  Social Needs  . Financial resource strain: Not on file  . Food insecurity:    Worry: Not on file    Inability: Not on file  . Transportation needs:    Medical: Not on file    Non-medical: Not on file  Tobacco Use  . Smoking status: Never Smoker  . Smokeless tobacco: Never Used  Substance and Sexual Activity  . Alcohol use: Yes    Alcohol/week: 8.4 oz    Types: 14 Glasses of wine per week    Comment: ocassional glass of wine  . Drug use: No  . Sexual activity: Not Currently    Birth control/protection: Post-menopausal  Lifestyle  . Physical activity:    Days per week: Not on file    Minutes per session: Not on file  . Stress: Not on file  Relationships  . Social connections:    Talks on phone: Not on file    Gets together: Not on file    Attends religious service: Not on file    Active member of club or organization: Not on file    Attends meetings of clubs or organizations: Not on file    Relationship status: Not on file  Other Topics Concern  . Not on file  Social History Narrative  . Not on file     Family History:  The patient's family history includes Cancer in her maternal aunt and mother; Heart disease in her father.   ROS:   Please see the history of present illness.    Still hears some wheezing but this is improved compared to prior.  Wheezing occurs when she is recumbent. All other systems reviewed and are negative.   PHYSICAL EXAM:   VS:  BP (!) 146/70   Pulse (!) 50   Ht 5\' 7"  (1.702 m)   Wt 232 lb 3.2 oz (105.3 kg)   BMI 36.37 kg/m    GEN: Well nourished, well developed, in no acute distress  HEENT: normal  Neck: no JVD, carotid bruits, or masses Cardiac: IIRR; no murmurs, rubs, or gallops, trace LE edema  Respiratory:  clear to auscultation bilaterally, normal work of breathing GI: soft, nontender, nondistended, + BS MS: no deformity or atrophy  Skin: warm  and dry, no rash Neuro:  Alert and Oriented x 3, Strength and sensation are intact Psych: euthymic mood, full affect  Wt Readings from Last 3 Encounters:  12/05/17 232 lb 3.2 oz (105.3 kg)  08/14/17  221 lb 9.6 oz (100.5 kg)  06/03/17 220 lb 6.4 oz (100 kg)      Studies/Labs Reviewed:   EKG:  EKG  Not repeated  Recent Labs: 06/03/2017: BUN 14; Creatinine, Ser 0.87; Hemoglobin 12.8; NT-Pro BNP 722; Platelets 383; Potassium 4.3; Sodium 137   Lipid Panel No results found for: CHOL, TRIG, HDL, CHOLHDL, VLDL, LDLCALC, LDLDIRECT  Additional studies/ records that were reviewed today include:  No new functional data    ASSESSMENT:    1. Chronic diastolic HF (heart failure) ([place])   2. Essential hypertension   3. PAF (paroxysmal atrial fibrillation) ([place])   4. Long term current use of anticoagulant therapy   5. Mixed hyperlipidemia      PLAN:  In order of problems listed above:  1. Minimal lower extremity edema.  No clinical evidence of pulmonary congestion of volume overload. 2. The blood pressure is borderline at 146/70.  Low-salt diet and weight loss were discussed. 3. On anticoagulation therapy to prevent stroke.  Current rhythm suggests that atrial fibrillation is present by auscultation 4. No bleeding complications on Eliquis.  Check hemoglobin and kidney function today. 5. Discussed target LDL less than 100.  Clinical follow-up in 6 to 9 months.  Can be seen by APP.  Reason for follow-up is to assess for volume overload and rule out bleeding complications on anticoagulation therapy.  I need to see her 12 months or more from now.    Medication Adjustments/Labs and Tests Ordered: Current medicines are reviewed at length with the patient today.  Concerns regarding medicines are outlined above.  Medication changes, Labs and Tests ordered today are listed in the Patient Instructions below. Patient Instructions  Medication Instructions:  Your physician recommends that you  continue on your current medications as directed. Please refer to the Current Medication list given to you today.   Labwork: BMET and CBC today  Testing/Procedures: None  Follow-Up: Your physician wants you to follow-up in: 6-9 months with a PA or NP.  You will receive a reminder letter in the mail two months in advance. If you don't receive a letter, please call our office to schedule the follow-up appointment.   Any Other Special Instructions Will Be Listed Below (If Applicable).     If you need a refill on your cardiac medications before your next appointment, please call your pharmacy.      Signed, Sinclair Grooms, MD  12/05/2017 11:43 AM    [facility] [place], [place], [place]  [address] Phone: [phone]; Fax: [phone]

## 2017-12-31 DIAGNOSIS — Z79899 Other long term (current) drug therapy: Secondary | ICD-10-CM | POA: Diagnosis not present

## 2017-12-31 DIAGNOSIS — Z0000 Encounter for general adult medical examination without abnormal findings: Secondary | ICD-10-CM | POA: Diagnosis not present

## 2017-12-31 DIAGNOSIS — E039 Hypothyroidism, unspecified: Secondary | ICD-10-CM | POA: Diagnosis not present

## 2017-12-31 DIAGNOSIS — Z780 Asymptomatic menopausal state: Secondary | ICD-10-CM | POA: Diagnosis not present

## 2017-12-31 DIAGNOSIS — E7800 Pure hypercholesterolemia, unspecified: Secondary | ICD-10-CM | POA: Diagnosis not present

## 2017-12-31 DIAGNOSIS — R600 Localized edema: Secondary | ICD-10-CM | POA: Diagnosis not present

## 2017-12-31 DIAGNOSIS — Z1389 Encounter for screening for other disorder: Secondary | ICD-10-CM | POA: Diagnosis not present

## 2017-12-31 DIAGNOSIS — R0609 Other forms of dyspnea: Secondary | ICD-10-CM | POA: Diagnosis not present

## 2017-12-31 DIAGNOSIS — I480 Paroxysmal atrial fibrillation: Secondary | ICD-10-CM | POA: Diagnosis not present

## 2017-12-31 DIAGNOSIS — I10 Essential (primary) hypertension: Secondary | ICD-10-CM | POA: Diagnosis not present

## 2017-12-31 DIAGNOSIS — Z9882 Breast implant status: Secondary | ICD-10-CM | POA: Diagnosis not present

## 2017-12-31 DIAGNOSIS — F3342 Major depressive disorder, recurrent, in full remission: Secondary | ICD-10-CM | POA: Diagnosis not present

## 2018-01-02 DIAGNOSIS — M810 Age-related osteoporosis without current pathological fracture: Secondary | ICD-10-CM | POA: Diagnosis not present

## 2018-01-02 DIAGNOSIS — Z853 Personal history of malignant neoplasm of breast: Secondary | ICD-10-CM | POA: Diagnosis not present

## 2018-01-20 DIAGNOSIS — M810 Age-related osteoporosis without current pathological fracture: Secondary | ICD-10-CM | POA: Diagnosis not present

## 2018-01-29 DIAGNOSIS — I872 Venous insufficiency (chronic) (peripheral): Secondary | ICD-10-CM | POA: Diagnosis not present

## 2018-01-29 DIAGNOSIS — I10 Essential (primary) hypertension: Secondary | ICD-10-CM | POA: Diagnosis not present

## 2018-03-28 DIAGNOSIS — M810 Age-related osteoporosis without current pathological fracture: Secondary | ICD-10-CM | POA: Diagnosis not present

## 2018-03-28 DIAGNOSIS — I10 Essential (primary) hypertension: Secondary | ICD-10-CM | POA: Diagnosis not present

## 2018-03-28 DIAGNOSIS — N3281 Overactive bladder: Secondary | ICD-10-CM | POA: Diagnosis not present

## 2018-03-28 DIAGNOSIS — I480 Paroxysmal atrial fibrillation: Secondary | ICD-10-CM | POA: Diagnosis not present

## 2018-06-03 ENCOUNTER — Ambulatory Visit: Payer: Medicare Other | Admitting: Cardiology

## 2018-06-19 ENCOUNTER — Ambulatory Visit (INDEPENDENT_AMBULATORY_CARE_PROVIDER_SITE_OTHER): Payer: Medicare Other | Admitting: Interventional Cardiology

## 2018-06-19 ENCOUNTER — Encounter: Payer: Self-pay | Admitting: Interventional Cardiology

## 2018-06-19 VITALS — BP 144/80 | HR 61 | Ht 67.0 in | Wt 224.6 lb

## 2018-06-19 DIAGNOSIS — E782 Mixed hyperlipidemia: Secondary | ICD-10-CM

## 2018-06-19 DIAGNOSIS — I482 Chronic atrial fibrillation, unspecified: Secondary | ICD-10-CM

## 2018-06-19 DIAGNOSIS — Z7901 Long term (current) use of anticoagulants: Secondary | ICD-10-CM | POA: Diagnosis not present

## 2018-06-19 DIAGNOSIS — I10 Essential (primary) hypertension: Secondary | ICD-10-CM

## 2018-06-19 DIAGNOSIS — I5032 Chronic diastolic (congestive) heart failure: Secondary | ICD-10-CM | POA: Diagnosis not present

## 2018-06-19 NOTE — Progress Notes (Signed)
Cardiology Office Note:    Date:  06/19/2018   ID:  [name], DOB 26-May-1941, MRN [id number]  PCP:  Lajean Manes, MD  Cardiologist:  Sinclair Grooms, MD   Referring MD: Lajean Manes, MD   Chief Complaint  Patient presents with  . Atrial Fibrillation  . Congestive Heart Failure    History of Present Illness:    [name] is a 77 y.o. female with a hx of  chronic atrial fibrillation, diastolic heart failure, hypertension, chronic anticoagulation therapy, rheumatoid arthritis, and obstructive sleep apnea. Patient is reluctant to use adequate doses of diuretic therapy because of frequent urination.  [name] continues to complain of wheezing and difficulty with completing sentences before she has to take a deep breath.  She does have diastolic heart failure.  I have recommended more intense diuresis but because of a bladder problem and incontinence she is not been willing to intensify the dose.  All the other hand she does not have orthopnea or PND.  She denies chest pain.  No palpitations.  She does have peripheral edema intermittently worse at times.  No transient neurological symptoms.  She has had no bleeding of any significance.  No falls or head trauma.  She also feels that there is occasional facial swelling but is unable to correlate lower extremity swelling and shortness of breath with those instances.  She is not ambulating independently due to arthritis    Past Medical History:  Diagnosis Date  . Anal stenosis   . Arthritis    rheumatoid  . Atrial fibrillation   . Cancer [facility])    breast cancer  . Depression   . Dysrhythmia    atrial fibrilation  . History of cardioversion    02/04/2012  . Hyperlipidemia   . Hypertension   . Hypothyroidism   . OAB (overactive bladder)   . Sleep apnea    cpap---DON'T KNOW SETTINGS    Past Surgical History:  Procedure Laterality Date  . ABDOMINAL HYSTERECTOMY    . ANAL DILATION     multiple  . BREAST  SURGERY     right mastectomy; left lumpectomy  . CARDIOVERSION  02/04/2012   Procedure: CARDIOVERSION;  Surgeon: Sinclair Grooms, MD;  Location: [place];  Service: Cardiovascular;  Laterality: N/A;  . [place]  . INCISION AND DRAINAGE PERIRECTAL ABSCESS  11/01/2011   Procedure: IRRIGATION AND DEBRIDEMENT PERIRECTAL ABSCESS;  Surgeon: Shann Medal, MD;  Location: [place];  Service: General;  Laterality: N/A;  Rigid Sigmiodoscopy ; Irrigation and Debridement left perineum and labia.  Marland Kitchen JOINT REPLACEMENT     right knee  . TOTAL KNEE ARTHROPLASTY Left 10/03/2012   Procedure: LEFT TOTAL KNEE ARTHROPLASTY;  Surgeon: Yvette Rack., MD;  Location: [place];  Service: Orthopedics;  Laterality: Left;    Current Medications: Current Meds  Medication Sig  . acetaminophen (TYLENOL) 325 MG tablet Take 975 mg by mouth every 6 (six) hours as needed for mild pain or moderate pain.   Marland Kitchen apixaban (ELIQUIS) 5 MG TABS tablet Take 5 mg by mouth 2 (two) times daily.  Marland Kitchen atorvastatin (LIPITOR) 40 MG tablet Take 40 mg by mouth daily.  . calcium carbonate (TUMS - DOSED IN MG ELEMENTAL CALCIUM) 500 MG chewable tablet Chew 1 tablet by mouth daily.  Marland Kitchen diltiazem (CARDIZEM CD) 240 MG 24 hr capsule Take 240 mg by mouth daily.  Marland Kitchen docusate sodium (COLACE) 100 MG capsule Take 300 mg by mouth at bedtime.   Marland Kitchen  furosemide (LASIX) 20 MG tablet Take 20 mg by mouth daily.  Marland Kitchen levothyroxine (SYNTHROID, LEVOTHROID) 100 MCG tablet Take 100 mcg by mouth daily before breakfast.  . PARoxetine (PAXIL) 10 MG tablet Take 10 mg by mouth daily. Take with Paxil 40 mg to equal 50 mg daily.  Marland Kitchen PARoxetine (PAXIL) 40 MG tablet Take 40 mg by mouth daily. Take with Paxil 10 mg to equal 50 mg daily.  Marland Kitchen spironolactone (ALDACTONE) 25 MG tablet Take 25 mg by mouth daily.     Allergies:   Benazepril and Diclofenac   Social History   Socioeconomic History  . Marital status: Married    Spouse name: Not on file  . Number of children: Not on file    . Years of education: Not on file  . Highest education level: Not on file  Occupational History  . Not on file  Social Needs  . Financial resource strain: Not on file  . Food insecurity:    Worry: Not on file    Inability: Not on file  . Transportation needs:    Medical: Not on file    Non-medical: Not on file  Tobacco Use  . Smoking status: Never Smoker  . Smokeless tobacco: Never Used  Substance and Sexual Activity  . Alcohol use: Yes    Alcohol/week: 14.0 standard drinks    Types: 14 Glasses of wine per week    Comment: ocassional glass of wine  . Drug use: No  . Sexual activity: Not Currently    Birth control/protection: Post-menopausal  Lifestyle  . Physical activity:    Days per week: Not on file    Minutes per session: Not on file  . Stress: Not on file  Relationships  . Social connections:    Talks on phone: Not on file    Gets together: Not on file    Attends religious service: Not on file    Active member of club or organization: Not on file    Attends meetings of clubs or organizations: Not on file    Relationship status: Not on file  Other Topics Concern  . Not on file  Social History Narrative  . Not on file     Family History: The patient's family history includes Cancer in her maternal aunt and mother; Heart disease in her father.  ROS:   Please see the history of present illness.    Is concerned about her husband's memory, which is causing her anxiety.  She complains of wheezing although I have never heard these wheezing episodes.  This is been a complaint now for several years.  Facial swelling, vision disturbance, cough, hearing loss, and occasional chest pressure.  All other systems reviewed and are negative.  EKGs/Labs/Other Studies Reviewed:    The following studies were reviewed today: No new imaging data. Echocardiogram in April 2017 demonstrated: Study Conclusions  - Left ventricle: Systolic function was normal. The estimated    ejection fraction was in the range of 55% to 60%. - Mitral valve: Calcified annulus. - Left atrium: The atrium was moderately dilated. - Right atrium: The atrium was mildly dilated. - Atrial septum: A patent foramen ovale cannot be excluded.  EKG:  EKG is  ordered today.  The ekg ordered today demonstrates atrial fibrillation with controlled rate of 61 bpm and appearance of regularization.  She is not on digoxin therapy.  Recent Labs: 12/05/2017: BUN 14; Creatinine, Ser 0.86; Hemoglobin 13.3; Platelets 302; Potassium 4.7; Sodium 139  Recent Lipid Panel  No results found for: CHOL, TRIG, HDL, CHOLHDL, VLDL, LDLCALC, LDLDIRECT  Physical Exam:    VS:  BP (!) 144/80   Pulse 61   Ht 5\' 7"  (1.702 m)   Wt 224 lb 9.6 oz (101.9 kg)   SpO2 93%   BMI 35.18 kg/m     Wt Readings from Last 3 Encounters:  06/19/18 224 lb 9.6 oz (101.9 kg)  12/05/17 232 lb 3.2 oz (105.3 kg)  08/14/17 221 lb 9.6 oz (100.5 kg)     GEN: Obese.  Well nourished, well developed in no acute distress HEENT: Normal NECK: No JVD. LYMPHATICS: No lymphadenopathy CARDIAC: Irregularly irregular RR, no murmur, no gallop, trace bilateral ankle edema. VASCULAR: 2+ bilateral posterior tibial pulses.  No bruits. RESPIRATORY:  Clear to auscultation without rales, wheezing or rhonchi  ABDOMEN: Soft, non-tender, non-distended, No pulsatile mass, MUSCULOSKELETAL: No deformity  SKIN: Warm and dry NEUROLOGIC:  Alert and oriented x 3 PSYCHIATRIC:  Normal affect   ASSESSMENT:    1. Chronic atrial fibrillation   2. Chronic diastolic HF (heart failure) ([place])   3. Essential hypertension   4. Long term current use of anticoagulant therapy   5. Mixed hyperlipidemia    PLAN:    In order of problems listed above:  1. Controlled rate without without symptoms to suggest excessive rate control.  Will monitor over time and decide when or if appropriate to decrease diltiazem intensity.  We discussed the importance of chronic  anticoagulation in the setting of A. Fib. 2. Mild volume overload.  This is been present for quite some time.  I have suggested in the past that I would prefer 40 mg of furosemide per day.  I think her wheezing and some of the shortness of breath would be better.  She refuses this because of difficulty that it causes with urinary incontinence.  She is willing to accept her current state.  Current state is not different than it was approximately 1 year ago. 3. Elevated systolic blood pressure.  We talked about nonpharmacologic methods to lower blood pressure including low-salt and avoidance of nonsteroidal anti-inflammatory agents. 4. No bleeding on Eliquis.  Continue to monitor for blood in the urine and stool. 5. LDL target should be less than 70 given her underlying vascular disease.  She is on high intensity statin therapy.  In absence of symptoms, no change in dose is being made.  Aerobic activity is encouraged.  Notify us if breathing gets worse.  Weight daily.  Low-salt diet.  Greater than 50% of the time during this office visit was spent in education, counseling, and coordination of care related to underlying disease process and testing as outlined.    Medication Adjustments/Labs and Tests Ordered: Current medicines are reviewed at length with the patient today.  Concerns regarding medicines are outlined above.  Orders Placed This Encounter  Procedures  . EKG 12-Lead   No orders of the defined types were placed in this encounter.   Patient Instructions  Medication Instructions:  Your physician recommends that you continue on your current medications as directed. Please refer to the Current Medication list given to you today.  If you need a refill on your cardiac medications before your next appointment, please call your pharmacy.   Lab work: None If you have labs (blood work) drawn today and your tests are completely normal, you will receive your results only by: Marland Kitchen MyChart  Message (if you have MyChart) OR . A paper copy in the mail If  you have any lab test that is abnormal or we need to change your treatment, we will call you to review the results.  Testing/Procedures: None  Follow-Up: At [facility], you and your health needs are our priority.  As part of our continuing mission to provide you with exceptional heart care, we have created designated Provider Care Teams.  These Care Teams include your primary Cardiologist (physician) and Advanced Practice Providers (APPs -  Physician Assistants and Nurse Practitioners) who all work together to provide you with the care you need, when you need it. You will need a follow up appointment in 12 months.  Please call our office 2 months in advance to schedule this appointment.  You may see Sinclair Grooms, MD or one of the following Advanced Practice Providers on your designated Care Team:   Truitt Merle, NP Cecilie Kicks, NP . Kathyrn Drown, NP  Any Other Special Instructions Will Be Listed Below (If Applicable).  Your physician discussed the importance of regular exercise and recommended that you start or continue a regular exercise program for good health.       Signed, Sinclair Grooms, MD  06/19/2018 1:45 PM    [facility]

## 2018-06-19 NOTE — Patient Instructions (Signed)
Medication Instructions:  Your physician recommends that you continue on your current medications as directed. Please refer to the Current Medication list given to you today.  If you need a refill on your cardiac medications before your next appointment, please call your pharmacy.   Lab work: None If you have labs (blood work) drawn today and your tests are completely normal, you will receive your results only by: Marland Kitchen MyChart Message (if you have MyChart) OR . A paper copy in the mail If you have any lab test that is abnormal or we need to change your treatment, we will call you to review the results.  Testing/Procedures: None  Follow-Up: At [facility], you and your health needs are our priority.  As part of our continuing mission to provide you with exceptional heart care, we have created designated Provider Care Teams.  These Care Teams include your primary Cardiologist (physician) and Advanced Practice Providers (APPs -  Physician Assistants and Nurse Practitioners) who all work together to provide you with the care you need, when you need it. You will need a follow up appointment in 12 months.  Please call our office 2 months in advance to schedule this appointment.  You may see Sinclair Grooms, MD or one of the following Advanced Practice Providers on your designated Care Team:   Truitt Merle, NP Cecilie Kicks, NP . Kathyrn Drown, NP  Any Other Special Instructions Will Be Listed Below (If Applicable).  Your physician discussed the importance of regular exercise and recommended that you start or continue a regular exercise program for good health.

## 2018-07-10 DIAGNOSIS — Z853 Personal history of malignant neoplasm of breast: Secondary | ICD-10-CM | POA: Diagnosis not present

## 2018-08-11 DIAGNOSIS — I480 Paroxysmal atrial fibrillation: Secondary | ICD-10-CM | POA: Diagnosis not present

## 2018-08-11 DIAGNOSIS — M810 Age-related osteoporosis without current pathological fracture: Secondary | ICD-10-CM | POA: Diagnosis not present

## 2018-08-11 DIAGNOSIS — Z853 Personal history of malignant neoplasm of breast: Secondary | ICD-10-CM | POA: Diagnosis not present

## 2018-08-11 DIAGNOSIS — Z79899 Other long term (current) drug therapy: Secondary | ICD-10-CM | POA: Diagnosis not present

## 2018-08-11 DIAGNOSIS — I10 Essential (primary) hypertension: Secondary | ICD-10-CM | POA: Diagnosis not present

## 2018-08-29 DIAGNOSIS — R918 Other nonspecific abnormal finding of lung field: Secondary | ICD-10-CM | POA: Diagnosis not present

## 2018-08-29 DIAGNOSIS — R05 Cough: Secondary | ICD-10-CM | POA: Diagnosis not present

## 2018-08-29 DIAGNOSIS — R0689 Other abnormalities of breathing: Secondary | ICD-10-CM | POA: Diagnosis not present

## 2018-09-02 ENCOUNTER — Other Ambulatory Visit: Payer: Self-pay | Admitting: Interventional Cardiology

## 2019-01-02 DIAGNOSIS — Z79899 Other long term (current) drug therapy: Secondary | ICD-10-CM | POA: Diagnosis not present

## 2019-01-02 DIAGNOSIS — Z0000 Encounter for general adult medical examination without abnormal findings: Secondary | ICD-10-CM | POA: Diagnosis not present

## 2019-01-02 DIAGNOSIS — I480 Paroxysmal atrial fibrillation: Secondary | ICD-10-CM | POA: Diagnosis not present

## 2019-01-02 DIAGNOSIS — I10 Essential (primary) hypertension: Secondary | ICD-10-CM | POA: Diagnosis not present

## 2019-01-02 DIAGNOSIS — N3281 Overactive bladder: Secondary | ICD-10-CM | POA: Diagnosis not present

## 2019-01-02 DIAGNOSIS — E039 Hypothyroidism, unspecified: Secondary | ICD-10-CM | POA: Diagnosis not present

## 2019-01-02 DIAGNOSIS — F3342 Major depressive disorder, recurrent, in full remission: Secondary | ICD-10-CM | POA: Diagnosis not present

## 2019-01-02 DIAGNOSIS — E7800 Pure hypercholesterolemia, unspecified: Secondary | ICD-10-CM | POA: Diagnosis not present

## 2019-01-04 ENCOUNTER — Other Ambulatory Visit: Payer: Self-pay | Admitting: Interventional Cardiology

## 2019-01-05 NOTE — Telephone Encounter (Signed)
Age 78, weight 102kg, SCr 0.86 on 12/05/17, last OV 06/19/18 with [name], afib indication

## 2019-03-27 DIAGNOSIS — M810 Age-related osteoporosis without current pathological fracture: Secondary | ICD-10-CM | POA: Diagnosis not present

## 2019-03-27 DIAGNOSIS — I10 Essential (primary) hypertension: Secondary | ICD-10-CM | POA: Diagnosis not present

## 2019-03-27 DIAGNOSIS — E039 Hypothyroidism, unspecified: Secondary | ICD-10-CM | POA: Diagnosis not present

## 2019-03-27 DIAGNOSIS — I480 Paroxysmal atrial fibrillation: Secondary | ICD-10-CM | POA: Diagnosis not present

## 2019-03-27 DIAGNOSIS — F3342 Major depressive disorder, recurrent, in full remission: Secondary | ICD-10-CM | POA: Diagnosis not present

## 2019-04-29 DIAGNOSIS — F3342 Major depressive disorder, recurrent, in full remission: Secondary | ICD-10-CM | POA: Diagnosis not present

## 2019-04-29 DIAGNOSIS — I10 Essential (primary) hypertension: Secondary | ICD-10-CM | POA: Diagnosis not present

## 2019-04-29 DIAGNOSIS — M810 Age-related osteoporosis without current pathological fracture: Secondary | ICD-10-CM | POA: Diagnosis not present

## 2019-04-29 DIAGNOSIS — I480 Paroxysmal atrial fibrillation: Secondary | ICD-10-CM | POA: Diagnosis not present

## 2019-04-29 DIAGNOSIS — E039 Hypothyroidism, unspecified: Secondary | ICD-10-CM | POA: Diagnosis not present

## 2019-05-01 DIAGNOSIS — E7800 Pure hypercholesterolemia, unspecified: Secondary | ICD-10-CM | POA: Diagnosis not present

## 2019-05-01 DIAGNOSIS — I10 Essential (primary) hypertension: Secondary | ICD-10-CM | POA: Diagnosis not present

## 2019-05-01 DIAGNOSIS — F3342 Major depressive disorder, recurrent, in full remission: Secondary | ICD-10-CM | POA: Diagnosis not present

## 2019-05-01 DIAGNOSIS — E039 Hypothyroidism, unspecified: Secondary | ICD-10-CM | POA: Diagnosis not present

## 2019-05-01 DIAGNOSIS — M810 Age-related osteoporosis without current pathological fracture: Secondary | ICD-10-CM | POA: Diagnosis not present

## 2019-05-01 DIAGNOSIS — I480 Paroxysmal atrial fibrillation: Secondary | ICD-10-CM | POA: Diagnosis not present

## 2019-06-19 ENCOUNTER — Other Ambulatory Visit: Payer: Self-pay | Admitting: Interventional Cardiology

## 2019-06-19 NOTE — Telephone Encounter (Signed)
Prescription refill request for Eliquis received.  Last office visit: 08/19/2017, [name] Scr: 0.72. 01/02/2019 vis [facility] Age: 78 y.o. Weight: 101.9 kg  Prescription refill sent. Pt has an appointment to see Dr. Tamala George in January, 2021.

## 2019-06-23 DIAGNOSIS — I480 Paroxysmal atrial fibrillation: Secondary | ICD-10-CM | POA: Diagnosis not present

## 2019-06-23 DIAGNOSIS — E7800 Pure hypercholesterolemia, unspecified: Secondary | ICD-10-CM | POA: Diagnosis not present

## 2019-06-23 DIAGNOSIS — E039 Hypothyroidism, unspecified: Secondary | ICD-10-CM | POA: Diagnosis not present

## 2019-06-23 DIAGNOSIS — I10 Essential (primary) hypertension: Secondary | ICD-10-CM | POA: Diagnosis not present

## 2019-06-23 DIAGNOSIS — F3342 Major depressive disorder, recurrent, in full remission: Secondary | ICD-10-CM | POA: Diagnosis not present

## 2019-06-23 DIAGNOSIS — M810 Age-related osteoporosis without current pathological fracture: Secondary | ICD-10-CM | POA: Diagnosis not present

## 2019-07-28 DIAGNOSIS — Z23 Encounter for immunization: Secondary | ICD-10-CM | POA: Diagnosis not present

## 2019-08-23 NOTE — Progress Notes (Deleted)
Cardiology Office Note:    Date:  08/23/2019   ID:  [name], DOB [birth year], MRN [id number]  PCP:  Lajean Manes, MD  Cardiologist:  Sinclair Grooms, MD   Referring MD: Lajean Manes, MD   No chief complaint on file.   History of Present Illness:    [name] is a 79 y.o. female with a hx of chronic atrial fibrillation, diastolic heart failure, hypertension, chronic anticoagulation therapy, rheumatoid arthritis, and obstructive sleep apnea. Patient is reluctant to use adequate doses of diuretic therapy because of frequent urination.  ***  Past Medical History:  Diagnosis Date  . Anal stenosis   . Arthritis    rheumatoid  . Atrial fibrillation   . Cancer [facility])    breast cancer  . Depression   . Dysrhythmia    atrial fibrilation  . History of cardioversion    02/04/2012  . Hyperlipidemia   . Hypertension   . Hypothyroidism   . OAB (overactive bladder)   . Sleep apnea    cpap---DON'T KNOW SETTINGS    Past Surgical History:  Procedure Laterality Date  . ABDOMINAL HYSTERECTOMY    . ANAL DILATION     multiple  . BREAST SURGERY     right mastectomy; left lumpectomy  . CARDIOVERSION  02/04/2012   Procedure: CARDIOVERSION;  Surgeon: Sinclair Grooms, MD;  Location: [place];  Service: Cardiovascular;  Laterality: N/A;  . [place]  . INCISION AND DRAINAGE PERIRECTAL ABSCESS  11/01/2011   Procedure: IRRIGATION AND DEBRIDEMENT PERIRECTAL ABSCESS;  Surgeon: Shann Medal, MD;  Location: [place];  Service: General;  Laterality: N/A;  Rigid Sigmiodoscopy ; Irrigation and Debridement left perineum and labia.  Marland Kitchen JOINT REPLACEMENT     right knee  . TOTAL KNEE ARTHROPLASTY Left 10/03/2012   Procedure: LEFT TOTAL KNEE ARTHROPLASTY;  Surgeon: Yvette Rack., MD;  Location: [place];  Service: Orthopedics;  Laterality: Left;    Current Medications: No outpatient medications have been marked as taking for the 08/24/19 encounter (Appointment) with Belva Crome,  MD.     Allergies:   Benazepril and Diclofenac   Social History   Socioeconomic History  . Marital status: Married    Spouse name: Not on file  . Number of children: Not on file  . Years of education: Not on file  . Highest education level: Not on file  Occupational History  . Not on file  Tobacco Use  . Smoking status: Never Smoker  . Smokeless tobacco: Never Used  Substance and Sexual Activity  . Alcohol use: Yes    Alcohol/week: 14.0 standard drinks    Types: 14 Glasses of wine per week    Comment: ocassional glass of wine  . Drug use: No  . Sexual activity: Not Currently    Birth control/protection: Post-menopausal  Other Topics Concern  . Not on file  Social History Narrative  . Not on file   Social Determinants of Health   Financial Resource Strain:   . Difficulty of Paying Living Expenses: Not on file  Food Insecurity:   . Worried About Charity fundraiser in the Last Year: Not on file  . Ran Out of Food in the Last Year: Not on file  Transportation Needs:   . Lack of Transportation (Medical): Not on file  . Lack of Transportation (Non-Medical): Not on file  Physical Activity:   . Days of Exercise per Week: Not on file  . Minutes  of Exercise per Session: Not on file  Stress:   . Feeling of Stress : Not on file  Social Connections:   . Frequency of Communication with Friends and Family: Not on file  . Frequency of Social Gatherings with Friends and Family: Not on file  . Attends Religious Services: Not on file  . Active Member of Clubs or Organizations: Not on file  . Attends Archivist Meetings: Not on file  . Marital Status: Not on file     Family History: The patient's family history includes Cancer in her maternal aunt and mother; Heart disease in her father.  ROS:   Please see the history of present illness.    *** All other systems reviewed and are negative.  EKGs/Labs/Other Studies Reviewed:    The following studies were reviewed  today: ***  EKG:  EKG ***  Recent Labs: No results found for requested labs within last 8760 hours.  Recent Lipid Panel No results found for: CHOL, TRIG, HDL, CHOLHDL, VLDL, LDLCALC, LDLDIRECT  Physical Exam:    VS:  There were no vitals taken for this visit.    Wt Readings from Last 3 Encounters:  06/19/18 224 lb 9.6 oz (101.9 kg)  12/05/17 232 lb 3.2 oz (105.3 kg)  08/14/17 221 lb 9.6 oz (100.5 kg)     GEN: ***. No acute distress HEENT: Normal NECK: No JVD. LYMPHATICS: No lymphadenopathy CARDIAC: *** RRR without murmur, gallop, or edema. VASCULAR: *** Normal Pulses. No bruits. RESPIRATORY:  Clear to auscultation without rales, wheezing or rhonchi  ABDOMEN: Soft, non-tender, non-distended, No pulsatile mass, MUSCULOSKELETAL: No deformity  SKIN: Warm and dry NEUROLOGIC:  Alert and oriented x 3 PSYCHIATRIC:  Normal affect   ASSESSMENT:    1. Chronic atrial fibrillation ([place])   2. Chronic diastolic HF (heart failure) ([place])   3. Essential hypertension   4. Long term current use of anticoagulant therapy   5. Mixed hyperlipidemia   6. Educated about COVID-19 virus infection    PLAN:    In order of problems listed above:  1. ***   Medication Adjustments/Labs and Tests Ordered: Current medicines are reviewed at length with the patient today.  Concerns regarding medicines are outlined above.  No orders of the defined types were placed in this encounter.  No orders of the defined types were placed in this encounter.   There are no Patient Instructions on file for this visit.   Signed, Sinclair Grooms, MD  08/23/2019 6:53 PM    [place]

## 2019-08-24 ENCOUNTER — Ambulatory Visit: Payer: Medicare Other | Admitting: Interventional Cardiology

## 2019-08-25 DIAGNOSIS — Z23 Encounter for immunization: Secondary | ICD-10-CM | POA: Diagnosis not present

## 2019-08-28 DIAGNOSIS — M160 Bilateral primary osteoarthritis of hip: Secondary | ICD-10-CM | POA: Diagnosis not present

## 2019-08-28 DIAGNOSIS — R2689 Other abnormalities of gait and mobility: Secondary | ICD-10-CM | POA: Diagnosis not present

## 2019-08-28 DIAGNOSIS — M6281 Muscle weakness (generalized): Secondary | ICD-10-CM | POA: Diagnosis not present

## 2019-08-31 DIAGNOSIS — M6281 Muscle weakness (generalized): Secondary | ICD-10-CM | POA: Diagnosis not present

## 2019-08-31 DIAGNOSIS — M160 Bilateral primary osteoarthritis of hip: Secondary | ICD-10-CM | POA: Diagnosis not present

## 2019-08-31 DIAGNOSIS — R2689 Other abnormalities of gait and mobility: Secondary | ICD-10-CM | POA: Diagnosis not present

## 2019-09-02 DIAGNOSIS — M160 Bilateral primary osteoarthritis of hip: Secondary | ICD-10-CM | POA: Diagnosis not present

## 2019-09-02 DIAGNOSIS — R2689 Other abnormalities of gait and mobility: Secondary | ICD-10-CM | POA: Diagnosis not present

## 2019-09-02 DIAGNOSIS — M6281 Muscle weakness (generalized): Secondary | ICD-10-CM | POA: Diagnosis not present

## 2019-09-07 DIAGNOSIS — R2689 Other abnormalities of gait and mobility: Secondary | ICD-10-CM | POA: Diagnosis not present

## 2019-09-07 DIAGNOSIS — M6281 Muscle weakness (generalized): Secondary | ICD-10-CM | POA: Diagnosis not present

## 2019-09-07 DIAGNOSIS — M160 Bilateral primary osteoarthritis of hip: Secondary | ICD-10-CM | POA: Diagnosis not present

## 2019-09-09 DIAGNOSIS — M6281 Muscle weakness (generalized): Secondary | ICD-10-CM | POA: Diagnosis not present

## 2019-09-09 DIAGNOSIS — M160 Bilateral primary osteoarthritis of hip: Secondary | ICD-10-CM | POA: Diagnosis not present

## 2019-09-09 DIAGNOSIS — R2689 Other abnormalities of gait and mobility: Secondary | ICD-10-CM | POA: Diagnosis not present

## 2019-09-14 DIAGNOSIS — M160 Bilateral primary osteoarthritis of hip: Secondary | ICD-10-CM | POA: Diagnosis not present

## 2019-09-14 DIAGNOSIS — M6281 Muscle weakness (generalized): Secondary | ICD-10-CM | POA: Diagnosis not present

## 2019-09-14 DIAGNOSIS — R2689 Other abnormalities of gait and mobility: Secondary | ICD-10-CM | POA: Diagnosis not present

## 2019-09-16 DIAGNOSIS — M6281 Muscle weakness (generalized): Secondary | ICD-10-CM | POA: Diagnosis not present

## 2019-09-16 DIAGNOSIS — M160 Bilateral primary osteoarthritis of hip: Secondary | ICD-10-CM | POA: Diagnosis not present

## 2019-09-16 DIAGNOSIS — R2689 Other abnormalities of gait and mobility: Secondary | ICD-10-CM | POA: Diagnosis not present
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
## 2019-09-21 DIAGNOSIS — E7800 Pure hypercholesterolemia, unspecified: Secondary | ICD-10-CM | POA: Diagnosis not present

## 2019-09-21 DIAGNOSIS — E039 Hypothyroidism, unspecified: Secondary | ICD-10-CM | POA: Diagnosis not present

## 2019-09-21 DIAGNOSIS — M810 Age-related osteoporosis without current pathological fracture: Secondary | ICD-10-CM | POA: Diagnosis not present

## 2019-09-21 DIAGNOSIS — I10 Essential (primary) hypertension: Secondary | ICD-10-CM | POA: Diagnosis not present

## 2019-09-21 DIAGNOSIS — F3342 Major depressive disorder, recurrent, in full remission: Secondary | ICD-10-CM | POA: Diagnosis not present

## 2019-09-21 DIAGNOSIS — M6281 Muscle weakness (generalized): Secondary | ICD-10-CM | POA: Diagnosis not present

## 2019-09-21 DIAGNOSIS — I480 Paroxysmal atrial fibrillation: Secondary | ICD-10-CM | POA: Diagnosis not present

## 2019-09-21 DIAGNOSIS — M160 Bilateral primary osteoarthritis of hip: Secondary | ICD-10-CM | POA: Diagnosis not present

## 2019-09-21 DIAGNOSIS — R2689 Other abnormalities of gait and mobility: Secondary | ICD-10-CM | POA: Diagnosis not present

## 2019-09-22 ENCOUNTER — Other Ambulatory Visit: Payer: Self-pay

## 2019-09-22 ENCOUNTER — Encounter (INDEPENDENT_AMBULATORY_CARE_PROVIDER_SITE_OTHER): Payer: Self-pay

## 2019-09-22 ENCOUNTER — Ambulatory Visit (INDEPENDENT_AMBULATORY_CARE_PROVIDER_SITE_OTHER): Payer: Medicare Other | Admitting: Cardiology

## 2019-09-22 ENCOUNTER — Encounter: Payer: Self-pay | Admitting: Cardiology

## 2019-09-22 VITALS — BP 160/80 | HR 70 | Ht 67.0 in | Wt 176.0 lb

## 2019-09-22 DIAGNOSIS — I482 Chronic atrial fibrillation, unspecified: Secondary | ICD-10-CM

## 2019-09-22 DIAGNOSIS — I10 Essential (primary) hypertension: Secondary | ICD-10-CM | POA: Diagnosis not present

## 2019-09-22 DIAGNOSIS — I5032 Chronic diastolic (congestive) heart failure: Secondary | ICD-10-CM

## 2019-09-22 DIAGNOSIS — Z7901 Long term (current) use of anticoagulants: Secondary | ICD-10-CM | POA: Diagnosis not present

## 2019-09-22 NOTE — Patient Instructions (Signed)
Medication Instructions:   Your physician recommends that you continue on your current medications as directed. Please refer to the Current Medication list given to you today.  *If you need a refill on your cardiac medications before your next appointment, please call your pharmacy*  Lab Work:  You will have labs drawn today: BMET/CBC  If you have labs (blood work) drawn today and your tests are completely normal, you will receive your results only by: Marland Kitchen MyChart Message (if you have MyChart) OR . A paper copy in the mail If you have any lab test that is abnormal or we need to change your treatment, we will call you to review the results.  Testing/Procedures:  None ordered today  Follow-Up: At [facility], you and your health needs are our priority.  As part of our continuing mission to provide you with exceptional heart care, we have created designated Provider Care Teams.  These Care Teams include your primary Cardiologist (physician) and Advanced Practice Providers (APPs -  Physician Assistants and Nurse Practitioners) who all work together to provide you with the care you need, when you need it.  Your next appointment:   6 month(s)  The format for your next appointment:   In Person  Provider:   You may see Sinclair Grooms, MD or one of the following Advanced Practice Providers on your designated Care Team:    Truitt Merle, NP  Cecilie Kicks, NP  Kathyrn Drown, NP

## 2019-09-23 LAB — BASIC METABOLIC PANEL
BUN/Creatinine Ratio: 15 (ref 12–28)
BUN: 10 mg/dL (ref 8–27)
CO2: 25 mmol/L (ref 20–29)
Calcium: 9.2 mg/dL (ref 8.7–10.3)
Chloride: 105 mmol/L (ref 96–106)
Creatinine, Ser: 0.65 mg/dL (ref 0.57–1.00)
GFR calc Af Amer: 97 mL/min/{1.73_m2} (ref >59)
GFR calc non Af Amer: 84 mL/min/{1.73_m2} (ref >59)
Glucose: 85 mg/dL (ref 65–99)
Potassium: 4.4 mmol/L (ref 3.5–5.2)
Sodium: 142 mmol/L (ref 134–144)

## 2019-09-23 LAB — CBC
Hematocrit: 36.6 % (ref 34.0–46.6)
Hemoglobin: 12.3 g/dL (ref 11.1–15.9)
MCH: 29.9 pg (ref 26.6–33.0)
MCHC: 33.6 g/dL (ref 31.5–35.7)
MCV: 89 fL (ref 79–97)
Platelets: 276 10*3/uL (ref 150–450)
RBC: 4.11 x10E6/uL (ref 3.77–5.28)
RDW: 13.2 % (ref 11.7–15.4)
WBC: 6.7 10*3/uL (ref 3.4–10.8)

## 2019-09-24 DIAGNOSIS — M6281 Muscle weakness (generalized): Secondary | ICD-10-CM | POA: Diagnosis not present

## 2019-09-24 DIAGNOSIS — R2689 Other abnormalities of gait and mobility: Secondary | ICD-10-CM | POA: Diagnosis not present

## 2019-09-24 DIAGNOSIS — M160 Bilateral primary osteoarthritis of hip: Secondary | ICD-10-CM | POA: Diagnosis not present

## 2019-09-28 DIAGNOSIS — R2689 Other abnormalities of gait and mobility: Secondary | ICD-10-CM | POA: Diagnosis not present

## 2019-09-28 DIAGNOSIS — M160 Bilateral primary osteoarthritis of hip: Secondary | ICD-10-CM | POA: Diagnosis not present

## 2019-09-28 DIAGNOSIS — M6281 Muscle weakness (generalized): Secondary | ICD-10-CM | POA: Diagnosis not present

## 2019-09-30 DIAGNOSIS — M160 Bilateral primary osteoarthritis of hip: Secondary | ICD-10-CM | POA: Diagnosis not present

## 2019-09-30 DIAGNOSIS — M6281 Muscle weakness (generalized): Secondary | ICD-10-CM | POA: Diagnosis not present

## 2019-09-30 DIAGNOSIS — R2689 Other abnormalities of gait and mobility: Secondary | ICD-10-CM | POA: Diagnosis not present

## 2019-10-05 DIAGNOSIS — M6281 Muscle weakness (generalized): Secondary | ICD-10-CM | POA: Diagnosis not present

## 2019-10-05 DIAGNOSIS — R2689 Other abnormalities of gait and mobility: Secondary | ICD-10-CM | POA: Diagnosis not present

## 2019-10-05 DIAGNOSIS — M160 Bilateral primary osteoarthritis of hip: Secondary | ICD-10-CM | POA: Diagnosis not present

## 2019-10-07 DIAGNOSIS — M6281 Muscle weakness (generalized): Secondary | ICD-10-CM | POA: Diagnosis not present

## 2019-10-07 DIAGNOSIS — R2689 Other abnormalities of gait and mobility: Secondary | ICD-10-CM | POA: Diagnosis not present

## 2019-10-07 DIAGNOSIS — M160 Bilateral primary osteoarthritis of hip: Secondary | ICD-10-CM | POA: Diagnosis not present

## 2019-10-12 DIAGNOSIS — R2689 Other abnormalities of gait and mobility: Secondary | ICD-10-CM | POA: Diagnosis not present

## 2019-10-12 DIAGNOSIS — M6281 Muscle weakness (generalized): Secondary | ICD-10-CM | POA: Diagnosis not present

## 2019-10-12 DIAGNOSIS — M160 Bilateral primary osteoarthritis of hip: Secondary | ICD-10-CM | POA: Diagnosis not present

## 2019-10-14 DIAGNOSIS — M6281 Muscle weakness (generalized): Secondary | ICD-10-CM | POA: Diagnosis not present

## 2019-10-14 DIAGNOSIS — M160 Bilateral primary osteoarthritis of hip: Secondary | ICD-10-CM | POA: Diagnosis not present

## 2019-10-14 DIAGNOSIS — R2689 Other abnormalities of gait and mobility: Secondary | ICD-10-CM | POA: Diagnosis not present

## 2019-10-20 DIAGNOSIS — R2689 Other abnormalities of gait and mobility: Secondary | ICD-10-CM | POA: Diagnosis not present

## 2019-10-20 DIAGNOSIS — M6281 Muscle weakness (generalized): Secondary | ICD-10-CM | POA: Diagnosis not present

## 2019-10-20 DIAGNOSIS — M160 Bilateral primary osteoarthritis of hip: Secondary | ICD-10-CM | POA: Diagnosis not present

## 2019-10-22 DIAGNOSIS — R2689 Other abnormalities of gait and mobility: Secondary | ICD-10-CM | POA: Diagnosis not present

## 2019-10-22 DIAGNOSIS — M6281 Muscle weakness (generalized): Secondary | ICD-10-CM | POA: Diagnosis not present

## 2019-10-22 DIAGNOSIS — M160 Bilateral primary osteoarthritis of hip: Secondary | ICD-10-CM | POA: Diagnosis not present

## 2019-10-26 DIAGNOSIS — M810 Age-related osteoporosis without current pathological fracture: Secondary | ICD-10-CM | POA: Diagnosis not present

## 2019-10-26 DIAGNOSIS — I10 Essential (primary) hypertension: Secondary | ICD-10-CM | POA: Diagnosis not present

## 2019-10-26 DIAGNOSIS — I480 Paroxysmal atrial fibrillation: Secondary | ICD-10-CM | POA: Diagnosis not present

## 2019-10-26 DIAGNOSIS — E7800 Pure hypercholesterolemia, unspecified: Secondary | ICD-10-CM | POA: Diagnosis not present

## 2019-10-26 DIAGNOSIS — F3342 Major depressive disorder, recurrent, in full remission: Secondary | ICD-10-CM | POA: Diagnosis not present

## 2019-10-26 DIAGNOSIS — E039 Hypothyroidism, unspecified: Secondary | ICD-10-CM | POA: Diagnosis not present

## 2019-10-27 DIAGNOSIS — M160 Bilateral primary osteoarthritis of hip: Secondary | ICD-10-CM | POA: Diagnosis not present

## 2019-10-27 DIAGNOSIS — R2689 Other abnormalities of gait and mobility: Secondary | ICD-10-CM | POA: Diagnosis not present

## 2019-10-27 DIAGNOSIS — M6281 Muscle weakness (generalized): Secondary | ICD-10-CM | POA: Diagnosis not present

## 2019-10-29 DIAGNOSIS — R2689 Other abnormalities of gait and mobility: Secondary | ICD-10-CM | POA: Diagnosis not present

## 2019-10-29 DIAGNOSIS — M160 Bilateral primary osteoarthritis of hip: Secondary | ICD-10-CM | POA: Diagnosis not present

## 2019-10-29 DIAGNOSIS — M6281 Muscle weakness (generalized): Secondary | ICD-10-CM | POA: Diagnosis not present

## 2019-11-02 DIAGNOSIS — M6281 Muscle weakness (generalized): Secondary | ICD-10-CM | POA: Diagnosis not present

## 2019-11-02 DIAGNOSIS — M160 Bilateral primary osteoarthritis of hip: Secondary | ICD-10-CM | POA: Diagnosis not present

## 2019-11-02 DIAGNOSIS — R2689 Other abnormalities of gait and mobility: Secondary | ICD-10-CM | POA: Diagnosis not present

## 2019-11-05 DIAGNOSIS — M6281 Muscle weakness (generalized): Secondary | ICD-10-CM | POA: Diagnosis not present

## 2019-11-05 DIAGNOSIS — M160 Bilateral primary osteoarthritis of hip: Secondary | ICD-10-CM | POA: Diagnosis not present

## 2019-11-05 DIAGNOSIS — R2689 Other abnormalities of gait and mobility: Secondary | ICD-10-CM | POA: Diagnosis not present

## 2019-11-11 DIAGNOSIS — M6281 Muscle weakness (generalized): Secondary | ICD-10-CM | POA: Diagnosis not present

## 2019-11-11 DIAGNOSIS — R2689 Other abnormalities of gait and mobility: Secondary | ICD-10-CM | POA: Diagnosis not present

## 2019-11-11 DIAGNOSIS — M160 Bilateral primary osteoarthritis of hip: Secondary | ICD-10-CM | POA: Diagnosis not present

## 2019-11-24 ENCOUNTER — Other Ambulatory Visit: Payer: Self-pay | Admitting: Interventional Cardiology

## 2019-11-24 NOTE — Telephone Encounter (Signed)
Prescription refill request for Eliquis received.  Last office visit: 2/23/2021[name] Scr: 0.65, 09/22/2019 Age: 79 y.o. Weight: 79.8 kg   Prescription refill sent.

## 2019-11-26 ENCOUNTER — Other Ambulatory Visit: Payer: Self-pay | Admitting: Interventional Cardiology

## 2019-12-22 DIAGNOSIS — M810 Age-related osteoporosis without current pathological fracture: Secondary | ICD-10-CM | POA: Diagnosis not present

## 2019-12-22 DIAGNOSIS — E039 Hypothyroidism, unspecified: Secondary | ICD-10-CM | POA: Diagnosis not present

## 2019-12-22 DIAGNOSIS — I10 Essential (primary) hypertension: Secondary | ICD-10-CM | POA: Diagnosis not present

## 2019-12-22 DIAGNOSIS — I480 Paroxysmal atrial fibrillation: Secondary | ICD-10-CM | POA: Diagnosis not present

## 2019-12-22 DIAGNOSIS — F3342 Major depressive disorder, recurrent, in full remission: Secondary | ICD-10-CM | POA: Diagnosis not present

## 2019-12-22 DIAGNOSIS — E7800 Pure hypercholesterolemia, unspecified: Secondary | ICD-10-CM | POA: Diagnosis not present

## 2020-01-06 DIAGNOSIS — Z79899 Other long term (current) drug therapy: Secondary | ICD-10-CM | POA: Diagnosis not present

## 2020-01-06 DIAGNOSIS — Z23 Encounter for immunization: Secondary | ICD-10-CM | POA: Diagnosis not present

## 2020-01-06 DIAGNOSIS — I10 Essential (primary) hypertension: Secondary | ICD-10-CM | POA: Diagnosis not present

## 2020-01-06 DIAGNOSIS — D6869 Other thrombophilia: Secondary | ICD-10-CM | POA: Diagnosis not present

## 2020-01-06 DIAGNOSIS — I480 Paroxysmal atrial fibrillation: Secondary | ICD-10-CM | POA: Diagnosis not present

## 2020-01-06 DIAGNOSIS — E7800 Pure hypercholesterolemia, unspecified: Secondary | ICD-10-CM | POA: Diagnosis not present

## 2020-01-06 DIAGNOSIS — M810 Age-related osteoporosis without current pathological fracture: Secondary | ICD-10-CM | POA: Diagnosis not present

## 2020-01-06 DIAGNOSIS — F330 Major depressive disorder, recurrent, mild: Secondary | ICD-10-CM | POA: Diagnosis not present

## 2020-01-06 DIAGNOSIS — E039 Hypothyroidism, unspecified: Secondary | ICD-10-CM | POA: Diagnosis not present

## 2020-01-06 DIAGNOSIS — H903 Sensorineural hearing loss, bilateral: Secondary | ICD-10-CM | POA: Diagnosis not present

## 2020-01-06 DIAGNOSIS — Z0000 Encounter for general adult medical examination without abnormal findings: Secondary | ICD-10-CM | POA: Diagnosis not present

## 2020-01-06 DIAGNOSIS — N3281 Overactive bladder: Secondary | ICD-10-CM | POA: Diagnosis not present

## 2020-01-07 DIAGNOSIS — M79675 Pain in left toe(s): Secondary | ICD-10-CM | POA: Diagnosis not present

## 2020-01-07 DIAGNOSIS — B351 Tinea unguium: Secondary | ICD-10-CM | POA: Diagnosis not present

## 2020-01-07 DIAGNOSIS — M79674 Pain in right toe(s): Secondary | ICD-10-CM | POA: Diagnosis not present

## 2020-01-14 ENCOUNTER — Other Ambulatory Visit: Payer: Self-pay | Admitting: Interventional Cardiology

## 2020-01-14 DIAGNOSIS — F3342 Major depressive disorder, recurrent, in full remission: Secondary | ICD-10-CM | POA: Diagnosis not present

## 2020-01-14 DIAGNOSIS — I10 Essential (primary) hypertension: Secondary | ICD-10-CM | POA: Diagnosis not present

## 2020-01-14 DIAGNOSIS — I480 Paroxysmal atrial fibrillation: Secondary | ICD-10-CM | POA: Diagnosis not present

## 2020-01-14 DIAGNOSIS — E7800 Pure hypercholesterolemia, unspecified: Secondary | ICD-10-CM | POA: Diagnosis not present

## 2020-01-14 DIAGNOSIS — F330 Major depressive disorder, recurrent, mild: Secondary | ICD-10-CM | POA: Diagnosis not present

## 2020-01-14 DIAGNOSIS — M810 Age-related osteoporosis without current pathological fracture: Secondary | ICD-10-CM | POA: Diagnosis not present

## 2020-01-14 DIAGNOSIS — E039 Hypothyroidism, unspecified: Secondary | ICD-10-CM | POA: Diagnosis not present

## 2020-01-14 MED ORDER — APIXABAN 5 MG PO TABS: 5.0000 mg | ORAL_TABLET | Freq: Two times a day (BID) | ORAL | 2 refills | Status: DC

## 2020-01-14 MED ORDER — DILTIAZEM HCL ER COATED BEADS 240 MG PO CP24: 240.0000 mg | ORAL_CAPSULE | Freq: Every day | ORAL | 2 refills | Status: DC

## 2020-01-14 NOTE — Telephone Encounter (Signed)
Pt calling requesting her medication be sent to a different pharmacy, [facility]. Please address

## 2020-01-14 NOTE — Telephone Encounter (Signed)
*  STAT* If patient is at the pharmacy, call can be transferred to refill team.   1. Which medications need to be refilled? (please list name of each medication and dose if known)   diltiazem (CARDIZEM CD) 240 MG 24 hr capsule    ELIQUIS 5 MG TABS tablet  spironolactone (ALDACTONE) 25 MG tablet  2. Which pharmacy/location (including street and city if local pharmacy) is medication to be sent to? [facility]  3. Do they need a 30 day or 90 day supply? 90 day supply

## 2020-01-14 NOTE — Telephone Encounter (Signed)
Eliquis 5mg  refill request received. Patient is 79 years old, weight-79.8kg, Crea-0.65 on 09/22/2019, Diagnosis-Afib, and last seen by Kathyrn Drown on 09/22/2019. Dose is appropriate based on dosing criteria. Will send in refill to requested pharmacy.

## 2020-02-24 DIAGNOSIS — G301 Alzheimer's disease with late onset: Secondary | ICD-10-CM | POA: Diagnosis not present

## 2020-02-24 DIAGNOSIS — I10 Essential (primary) hypertension: Secondary | ICD-10-CM | POA: Diagnosis not present

## 2020-02-24 DIAGNOSIS — F0280 Dementia in other diseases classified elsewhere without behavioral disturbance: Secondary | ICD-10-CM | POA: Diagnosis not present

## 2020-02-24 DIAGNOSIS — I480 Paroxysmal atrial fibrillation: Secondary | ICD-10-CM | POA: Diagnosis not present

## 2020-03-01 DIAGNOSIS — R350 Frequency of micturition: Secondary | ICD-10-CM | POA: Diagnosis not present

## 2020-03-01 DIAGNOSIS — N3944 Nocturnal enuresis: Secondary | ICD-10-CM | POA: Diagnosis not present

## 2020-03-01 DIAGNOSIS — R351 Nocturia: Secondary | ICD-10-CM | POA: Diagnosis not present

## 2020-03-01 DIAGNOSIS — N3946 Mixed incontinence: Secondary | ICD-10-CM | POA: Diagnosis not present
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
## 2020-03-16 ENCOUNTER — Telehealth: Payer: Self-pay | Admitting: *Deleted

## 2020-03-16 NOTE — Telephone Encounter (Signed)
Called pt to let pt know I moved pt's appt to be next to pt's spouses appt. So they would not have to wait a hour in between visits.  Both cell phone and house phone are disconnected.  Sent pt a Therapist, music. The same phone numbers were also listed on spouses information.

## 2020-03-16 NOTE — Progress Notes (Deleted)
CARDIOLOGY OFFICE NOTE  Date:  03/16/2020    [name] Date of Birth: 1940-12-05 Medical Record #[id number]  PCP:  Lajean Manes, MD  Cardiologist:  Zack Seal chief complaint on file.   History of Present Illness: [name] is a 79 y.o. female who presents today for a follow up visit. Seen for Dr. Tamala Julian.  She has a hx of chronic atrial fibrillation on anticoagulation, diastolic heart failure, hypertension, rheumatoid arthritis and OSA.  She was last seen by Dr. Tamala Julian back in November of 2019. She has had issues with wheezing and SOB - has been reluctant to increase her diureticts due to bladder issues/frequent urination/incontinence.   Last seen in February by Sharee Pimple. Felt to be doing ok. Some lower extremity edema noted that is stable with use of compression stockings. Noted some memory deficits.   Comes in today. Here with   Past Medical History:  Diagnosis Date  . Anal stenosis   . Arthritis    rheumatoid  . Atrial fibrillation   . Cancer [facility])    breast cancer  . Depression   . Dysrhythmia    atrial fibrilation  . History of cardioversion    02/04/2012  . Hyperlipidemia   . Hypertension   . Hypothyroidism   . OAB (overactive bladder)   . Sleep apnea    cpap---DON'T KNOW SETTINGS    Past Surgical History:  Procedure Laterality Date  . ABDOMINAL HYSTERECTOMY    . ANAL DILATION     multiple  . BREAST SURGERY     right mastectomy; left lumpectomy  . CARDIOVERSION  02/04/2012   Procedure: CARDIOVERSION;  Surgeon: Sinclair Grooms, MD;  Location: [place];  Service: Cardiovascular;  Laterality: N/A;  . [place]  . INCISION AND DRAINAGE PERIRECTAL ABSCESS  11/01/2011   Procedure: IRRIGATION AND DEBRIDEMENT PERIRECTAL ABSCESS;  Surgeon: Shann Medal, MD;  Location: [place];  Service: General;  Laterality: N/A;  Rigid Sigmiodoscopy ; Irrigation and Debridement left perineum and labia.  Marland Kitchen JOINT REPLACEMENT     right knee  . TOTAL KNEE  ARTHROPLASTY Left 10/03/2012   Procedure: LEFT TOTAL KNEE ARTHROPLASTY;  Surgeon: Yvette Rack., MD;  Location: [place];  Service: Orthopedics;  Laterality: Left;     Medications: No outpatient medications have been marked as taking for the 03/21/20 encounter (Appointment) with Burtis Junes, NP.     Allergies: Allergies  Allergen Reactions  . Benazepril Swelling and Cough    Swelling of tongue  . Diclofenac Itching    Feels difficult to swallow, but no tongue swelling or SOB. (per pt, this isn't an issue)    Social History: The patient  reports that she has never smoked. She has never used smokeless tobacco. She reports current alcohol use of about 14.0 standard drinks of alcohol per week. She reports that she does not use drugs.   Family History: The patient's ***family history includes Cancer in her maternal aunt and mother; Heart disease in her father.   Review of Systems: Please see the history of present illness.   All other systems are reviewed and negative.   Physical Exam: VS:  There were no vitals taken for this visit. Marland Kitchen  BMI There is no height or weight on file to calculate BMI.  Wt Readings from Last 3 Encounters:  09/22/19 176 lb (79.8 kg)  06/19/18 224 lb 9.6 oz (101.9 kg)  12/05/17 232 lb 3.2 oz (105.3 kg)  General: Pleasant. Well developed, well nourished and in no acute distress.   HEENT: Normal.  Neck: Supple, no JVD, carotid bruits, or masses noted.  Cardiac: ***Regular rate and rhythm. No murmurs, rubs, or gallops. No edema.  Respiratory:  Lungs are clear to auscultation bilaterally with normal work of breathing.  GI: Soft and nontender.  MS: No deformity or atrophy. Gait and ROM intact.  Skin: Warm and dry. Color is normal.  Neuro:  Strength and sensation are intact and no gross focal deficits noted.  Psych: Alert, appropriate and with normal affect.   LABORATORY DATA:  EKG:  EKG {ACTION; IS/IS [id number]} ordered today.  Personally reviewed  by me. This demonstrates ***.  Lab Results  Component Value Date   WBC 6.7 09/22/2019   HGB 12.3 09/22/2019   HCT 36.6 09/22/2019   PLT 276 09/22/2019   GLUCOSE 85 09/22/2019   ALT 20 09/25/2012   AST 23 09/25/2012   NA 142 09/22/2019   K 4.4 09/22/2019   CL 105 09/22/2019   CREATININE 0.65 09/22/2019   BUN 10 09/22/2019   CO2 25 09/22/2019   TSH 12.782 (H) 11/06/2011   INR 0.97 09/25/2012       BNP (last 3 results) No results for input(s): BNP in the last 8760 hours.  ProBNP (last 3 results) No results for input(s): PROBNP in the last 8760 hours.   Other Studies Reviewed Today:  Echocardiogram in April 2017 Study Conclusions  - Left ventricle: Systolic function was normal. The estimated ejection fraction was in the range of 55% to 60%. - Mitral valve: Calcified annulus. - Left atrium: The atrium was moderately dilated. - Right atrium: The atrium was mildly dilated. - Atrial septum: A patent foramen ovale cannot be excluded.    ASSESSMENT AND PLAN:  1.  Chronic atrial fibrillation: -Rate very well controlled, HR 70 bpm on EKG today -Continue current dose of diltiazem -Anticoagulated with Eliquis>> ports compliance -Denies bleeding in stool or urine -Obtain lab work, CBC  2.  Chronic diastolic heart failure: -Has persistent, unchanged wheezing and difficulty taking deep breaths -Recommendations have been to uptitrate her diuretic therapy however patient has been persistently reluctant to do so secondary to incontinence and frequent urination  -Continue current dose of Lasix -Check BMET  3.  Hypertension: -Elevated today however reports more controlled at PCP -May need to add another antihypertensive agent if she continues to have elevated BP  4.  HLD: -Goal <70 given vascular disease -Continue statin -Needs lipid panel, LFTs   Current medicines are reviewed with the patient today.  The patient does not have concerns regarding medicines  other than what has been noted above.  The following changes have been made:  See above.  Labs/ tests ordered today include:   No orders of the defined types were placed in this encounter.    Disposition:   FU with *** in {gen number [id number]} {Days to years:10300}.   Patient is agreeable to this plan and will call if any problems develop in the interim.   SignedTruitt Merle, NP  03/16/2020 8:31 AM  [facility] [address] [place],   [address] Phone: [phone] Fax: [phone]

## 2020-03-21 ENCOUNTER — Ambulatory Visit: Payer: Medicare Other | Admitting: Nurse Practitioner

## 2020-03-29 DIAGNOSIS — M810 Age-related osteoporosis without current pathological fracture: Secondary | ICD-10-CM | POA: Diagnosis not present

## 2020-03-29 DIAGNOSIS — I10 Essential (primary) hypertension: Secondary | ICD-10-CM | POA: Diagnosis not present

## 2020-03-29 DIAGNOSIS — I480 Paroxysmal atrial fibrillation: Secondary | ICD-10-CM | POA: Diagnosis not present

## 2020-03-29 DIAGNOSIS — R600 Localized edema: Secondary | ICD-10-CM | POA: Diagnosis not present

## 2020-03-29 DIAGNOSIS — F411 Generalized anxiety disorder: Secondary | ICD-10-CM | POA: Diagnosis not present

## 2020-03-29 DIAGNOSIS — E039 Hypothyroidism, unspecified: Secondary | ICD-10-CM | POA: Diagnosis not present

## 2020-03-29 DIAGNOSIS — E7800 Pure hypercholesterolemia, unspecified: Secondary | ICD-10-CM | POA: Diagnosis not present

## 2020-04-15 DIAGNOSIS — S6000XA Contusion of unspecified finger without damage to nail, initial encounter: Secondary | ICD-10-CM | POA: Diagnosis not present

## 2020-04-20 DIAGNOSIS — M545 Low back pain: Secondary | ICD-10-CM | POA: Diagnosis not present

## 2020-04-20 DIAGNOSIS — Z853 Personal history of malignant neoplasm of breast: Secondary | ICD-10-CM | POA: Diagnosis not present

## 2020-04-20 DIAGNOSIS — M1611 Unilateral primary osteoarthritis, right hip: Secondary | ICD-10-CM | POA: Diagnosis not present

## 2020-04-20 DIAGNOSIS — W19XXXA Unspecified fall, initial encounter: Secondary | ICD-10-CM | POA: Diagnosis not present

## 2020-04-20 DIAGNOSIS — M8008XA Age-related osteoporosis with current pathological fracture, vertebra(e), initial encounter for fracture: Secondary | ICD-10-CM | POA: Diagnosis not present

## 2020-04-27 DIAGNOSIS — Z1211 Encounter for screening for malignant neoplasm of colon: Secondary | ICD-10-CM | POA: Diagnosis not present

## 2020-04-27 DIAGNOSIS — Z1239 Encounter for other screening for malignant neoplasm of breast: Secondary | ICD-10-CM | POA: Diagnosis not present

## 2020-04-27 DIAGNOSIS — Z23 Encounter for immunization: Secondary | ICD-10-CM | POA: Diagnosis not present

## 2020-04-27 DIAGNOSIS — N3281 Overactive bladder: Secondary | ICD-10-CM | POA: Diagnosis not present

## 2020-04-27 DIAGNOSIS — Z7189 Other specified counseling: Secondary | ICD-10-CM | POA: Diagnosis not present

## 2020-04-27 DIAGNOSIS — Z0000 Encounter for general adult medical examination without abnormal findings: Secondary | ICD-10-CM | POA: Diagnosis not present

## 2020-04-27 DIAGNOSIS — Z6824 Body mass index (BMI) 24.0-24.9, adult: Secondary | ICD-10-CM | POA: Diagnosis not present

## 2020-04-27 DIAGNOSIS — Z780 Asymptomatic menopausal state: Secondary | ICD-10-CM | POA: Diagnosis not present

## 2020-04-27 DIAGNOSIS — Z122 Encounter for screening for malignant neoplasm of respiratory organs: Secondary | ICD-10-CM | POA: Diagnosis not present

## 2020-04-27 DIAGNOSIS — Z9181 History of falling: Secondary | ICD-10-CM | POA: Diagnosis not present

## 2020-04-27 DIAGNOSIS — Z1389 Encounter for screening for other disorder: Secondary | ICD-10-CM | POA: Diagnosis not present

## 2020-05-25 DIAGNOSIS — S73191A Other sprain of right hip, initial encounter: Secondary | ICD-10-CM | POA: Diagnosis not present

## 2020-05-25 DIAGNOSIS — M8008XA Age-related osteoporosis with current pathological fracture, vertebra(e), initial encounter for fracture: Secondary | ICD-10-CM | POA: Diagnosis not present

## 2020-05-25 DIAGNOSIS — M47812 Spondylosis without myelopathy or radiculopathy, cervical region: Secondary | ICD-10-CM | POA: Diagnosis not present

## 2020-05-25 DIAGNOSIS — M4802 Spinal stenosis, cervical region: Secondary | ICD-10-CM | POA: Diagnosis not present

## 2020-05-25 DIAGNOSIS — M47814 Spondylosis without myelopathy or radiculopathy, thoracic region: Secondary | ICD-10-CM | POA: Diagnosis not present

## 2020-06-03 DIAGNOSIS — M4856XD Collapsed vertebra, not elsewhere classified, lumbar region, subsequent encounter for fracture with routine healing: Secondary | ICD-10-CM | POA: Diagnosis not present

## 2020-06-14 DIAGNOSIS — I10 Essential (primary) hypertension: Secondary | ICD-10-CM | POA: Diagnosis not present

## 2020-06-14 DIAGNOSIS — F0280 Dementia in other diseases classified elsewhere without behavioral disturbance: Secondary | ICD-10-CM | POA: Diagnosis not present

## 2020-06-14 DIAGNOSIS — R4189 Other symptoms and signs involving cognitive functions and awareness: Secondary | ICD-10-CM | POA: Diagnosis not present

## 2020-06-14 DIAGNOSIS — I4891 Unspecified atrial fibrillation: Secondary | ICD-10-CM | POA: Diagnosis not present

## 2020-07-04 DIAGNOSIS — C50919 Malignant neoplasm of unspecified site of unspecified female breast: Secondary | ICD-10-CM | POA: Diagnosis not present

## 2020-07-04 DIAGNOSIS — F0390 Unspecified dementia without behavioral disturbance: Secondary | ICD-10-CM | POA: Diagnosis not present

## 2020-07-14 DIAGNOSIS — I4891 Unspecified atrial fibrillation: Secondary | ICD-10-CM | POA: Diagnosis not present

## 2020-07-14 DIAGNOSIS — R4189 Other symptoms and signs involving cognitive functions and awareness: Secondary | ICD-10-CM | POA: Diagnosis not present

## 2020-07-14 DIAGNOSIS — F0280 Dementia in other diseases classified elsewhere without behavioral disturbance: Secondary | ICD-10-CM | POA: Diagnosis not present

## 2020-07-14 DIAGNOSIS — I10 Essential (primary) hypertension: Secondary | ICD-10-CM | POA: Diagnosis not present

## 2020-08-10 DIAGNOSIS — E7800 Pure hypercholesterolemia, unspecified: Secondary | ICD-10-CM | POA: Diagnosis not present

## 2020-08-10 DIAGNOSIS — Z0000 Encounter for general adult medical examination without abnormal findings: Secondary | ICD-10-CM | POA: Diagnosis not present

## 2020-08-10 DIAGNOSIS — I10 Essential (primary) hypertension: Secondary | ICD-10-CM | POA: Diagnosis not present

## 2020-08-10 DIAGNOSIS — E039 Hypothyroidism, unspecified: Secondary | ICD-10-CM | POA: Diagnosis not present

## 2020-08-10 DIAGNOSIS — F3341 Major depressive disorder, recurrent, in partial remission: Secondary | ICD-10-CM | POA: Diagnosis not present

## 2020-08-10 DIAGNOSIS — N3281 Overactive bladder: Secondary | ICD-10-CM | POA: Diagnosis not present

## 2020-08-10 DIAGNOSIS — G4730 Sleep apnea, unspecified: Secondary | ICD-10-CM | POA: Diagnosis not present

## 2020-08-10 DIAGNOSIS — I480 Paroxysmal atrial fibrillation: Secondary | ICD-10-CM | POA: Diagnosis not present

## 2020-08-10 DIAGNOSIS — D6869 Other thrombophilia: Secondary | ICD-10-CM | POA: Diagnosis not present

## 2020-08-10 DIAGNOSIS — M810 Age-related osteoporosis without current pathological fracture: Secondary | ICD-10-CM | POA: Diagnosis not present

## 2020-08-10 DIAGNOSIS — F0390 Unspecified dementia without behavioral disturbance: Secondary | ICD-10-CM | POA: Diagnosis not present

## 2020-08-10 DIAGNOSIS — Z7901 Long term (current) use of anticoagulants: Secondary | ICD-10-CM | POA: Diagnosis not present

## 2020-09-07 DIAGNOSIS — N898 Other specified noninflammatory disorders of vagina: Secondary | ICD-10-CM | POA: Diagnosis not present

## 2020-09-07 DIAGNOSIS — G8929 Other chronic pain: Secondary | ICD-10-CM | POA: Diagnosis not present

## 2020-09-07 DIAGNOSIS — M79601 Pain in right arm: Secondary | ICD-10-CM | POA: Diagnosis not present

## 2020-09-07 DIAGNOSIS — R198 Other specified symptoms and signs involving the digestive system and abdomen: Secondary | ICD-10-CM | POA: Diagnosis not present

## 2022-12-29 DEATH — deceased
